# Patient Record
Sex: Male | Born: 1975 | Hispanic: Yes | Marital: Married | Smoking: Current every day smoker
Health system: Southern US, Community
[De-identification: ages and names within clinical notes are randomized; demographics above are authoritative.]

## PROBLEM LIST (undated history)

## (undated) DIAGNOSIS — K219 Gastro-esophageal reflux disease without esophagitis: Secondary | ICD-10-CM

## (undated) DIAGNOSIS — J189 Pneumonia, unspecified organism: Secondary | ICD-10-CM

## (undated) DIAGNOSIS — F32A Depression, unspecified: Secondary | ICD-10-CM

## (undated) DIAGNOSIS — I700 Atherosclerosis of aorta: Secondary | ICD-10-CM

## (undated) DIAGNOSIS — I10 Essential (primary) hypertension: Secondary | ICD-10-CM

## (undated) DIAGNOSIS — G904 Autonomic dysreflexia: Secondary | ICD-10-CM

## (undated) DIAGNOSIS — J45909 Unspecified asthma, uncomplicated: Secondary | ICD-10-CM

## (undated) DIAGNOSIS — F419 Anxiety disorder, unspecified: Secondary | ICD-10-CM

## (undated) DIAGNOSIS — R519 Headache, unspecified: Secondary | ICD-10-CM

## (undated) DIAGNOSIS — K5790 Diverticulosis of intestine, part unspecified, without perforation or abscess without bleeding: Secondary | ICD-10-CM

## (undated) HISTORY — DX: Diverticulosis of intestine, part unspecified, without perforation or abscess without bleeding: K57.90

## (undated) HISTORY — DX: Essential (primary) hypertension: I10

## (undated) HISTORY — DX: Autonomic dysreflexia: G90.4

## (undated) HISTORY — DX: Depression, unspecified: F32.A

## (undated) HISTORY — DX: Anxiety disorder, unspecified: F41.9

## (undated) HISTORY — DX: Atherosclerosis of aorta: I70.0

## (undated) HISTORY — PX: BACK SURGERY: SHX140

---

## 2021-02-01 ENCOUNTER — Ambulatory Visit (INDEPENDENT_AMBULATORY_CARE_PROVIDER_SITE_OTHER): Payer: 59 | Admitting: Specialist

## 2021-02-01 ENCOUNTER — Ambulatory Visit (INDEPENDENT_AMBULATORY_CARE_PROVIDER_SITE_OTHER): Payer: 59

## 2021-02-01 ENCOUNTER — Encounter: Payer: Self-pay | Admitting: Specialist

## 2021-02-01 ENCOUNTER — Other Ambulatory Visit: Payer: Self-pay

## 2021-02-01 VITALS — BP 139/94 | HR 90 | Ht 69.0 in | Wt 171.0 lb

## 2021-02-01 DIAGNOSIS — M960 Pseudarthrosis after fusion or arthrodesis: Secondary | ICD-10-CM | POA: Diagnosis not present

## 2021-02-01 DIAGNOSIS — M542 Cervicalgia: Secondary | ICD-10-CM | POA: Diagnosis not present

## 2021-02-01 NOTE — Patient Instructions (Addendum)
Avoid overhead lifting and overhead use of the arms. Do not lift greater than 15-25 lbs. Adjust head rest in vehicle to prevent hyperextension if rear ended. Take extra precautions to avoid falling. CT scan of the cervical spine to assess for nonunion of the C6-7 level. Follow-Up Instructions: Return in about 3 weeks (around 02/22/2021).

## 2021-02-01 NOTE — Progress Notes (Signed)
Office Visit Note   Patient: Victor Huang           Date of Birth: 1976-12-01           MRN: [id number] Visit Date: 02/01/2021              Requested by: Dema Severin, NP [address] [place],  [place] [address] PCP: No primary care provider on file.   Assessment & Plan: Visit Diagnoses:  1. Cervicalgia   2. Pseudarthrosis after fusion or arthrodesis     Plan:Avoid overhead lifting and overhead use of the arms. Do not lift greater than 15-25 lbs. Adjust head rest in vehicle to prevent hyperextension if rear ended. Take extra precautions to avoid falling. CT scan of the cervical spine to assess for nonunion of the C6-7 level. Follow-Up Instructions: Return in about 3 weeks (around 02/22/2021).   Orders:  Orders Placed This Encounter  Procedures  . XR Cervical Spine 2 or 3 views   No orders of the defined types were placed in this encounter.     Procedures: No procedures performed   Clinical Data: No additional findings.   Subjective: Chief Complaint  Patient presents with  . Neck - Pain    2nd Opinion--referred by [facility]  . Left Shoulder - Pain    2nd Opinion--referred by [facility]    45 year old male with history of neck pain and left shoulder pain. He has a history of cervical spine surgery ACDF at 2 levels done at [facility] 10/2018. 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 He had spurs and post op he did well with about 98%improvement. He started experiencing increasing pain into the left shoulder And he was seen by the surgeon and was told that it was normal and the doctor did not spend much time with him. The Doctor that performed the surgery name he does not remember, ?Dr. Chales Abrahams. He has pain with with extension and pain with lying on the left shoulder and with overhead use of the left shoulder. No bowel or bladder difficulty, no gait difficulty.  History of left shoulder MRI and no previous left  Shoulder. The left shoulder pain improved with surgery on the neck.There is tightness in the trapezius with lying on the left side. The pain  He experiences with overhead use of the left arm and with the arm across his chest in into the left lower lateral neck.    Review of Systems  Constitutional: Negative for activity change, appetite change, chills, diaphoresis, fatigue, fever and unexpected weight change.  HENT: Negative.  Negative for congestion, dental problem, drooling, ear discharge, ear pain, facial swelling, hearing loss, mouth sores, nosebleeds, postnasal drip, rhinorrhea, sinus pressure, sinus pain, sneezing, sore throat, tinnitus, trouble swallowing and voice change.   Eyes: Negative.  Negative for photophobia, pain, discharge, redness, itching and visual disturbance.  Respiratory: Negative.  Negative for apnea, cough, choking, chest tightness, shortness of breath, wheezing and stridor.   Cardiovascular: Negative.  Negative for chest pain, palpitations and leg swelling.  Gastrointestinal: Negative.  Negative for abdominal distention, abdominal pain, anal bleeding, blood in stool, constipation, diarrhea, nausea, rectal pain and vomiting.  Endocrine: Positive for cold intolerance. Negative for heat intolerance, polydipsia, polyphagia and polyuria.  Genitourinary: Negative for difficulty urinating, dysuria, enuresis, flank pain, frequency, genital sores, hematuria and penile pain.  Musculoskeletal: Positive for neck pain  and neck stiffness. Negative for arthralgias, back pain, gait problem, joint swelling and myalgias.  Skin: Negative.  Negative for color change, pallor, rash and wound.  Allergic/Immunologic: Negative.  Negative for environmental allergies, food allergies and immunocompromised state.  Neurological: Negative.   Hematological: Negative.   Psychiatric/Behavioral: Negative.      Objective: Vital Signs: BP (!) 139/94 (BP  Location: Left Arm, Patient Position: Sitting)   Pulse 90   Ht 5\' 9"  (1.753 m)   Wt 171 lb (77.6 kg)   BMI 25.25 kg/m   Physical Exam Constitutional:      Appearance: He is well-developed and well-nourished.  HENT:     Head: Normocephalic and atraumatic.  Eyes:     Extraocular Movements: EOM normal.     Pupils: Pupils are equal, round, and reactive to light.  Pulmonary:     Effort: Pulmonary effort is normal.     Breath sounds: Normal breath sounds.  Abdominal:     General: Bowel sounds are normal.     Palpations: Abdomen is soft.  Musculoskeletal:     Cervical back: Normal range of motion and neck supple.  Skin:    General: Skin is warm and dry.  Neurological:     Mental Status: He is alert and oriented to person, place, and time.  Psychiatric:        Mood and Affect: Mood and affect normal.        Behavior: Behavior normal.        Thought Content: Thought content normal.        Judgment: Judgment normal.     Back Exam   Tenderness  The patient is experiencing tenderness in the cervical.  Range of Motion  Extension:  50 abnormal  Flexion:  70 abnormal  Lateral bend right: 70  Lateral bend left: 60  Rotation right: 70  Rotation left: 60   Reflexes  Biceps: 2/4  Comments:  Strength is intact bilateral arms. Tender left cervical lateral mass lower neck C5-7      Specialty Comments:  No specialty comments available.  Imaging: XR Cervical Spine 2 or 3 views  Result Date: 02/01/2021 AP and lateral flexion and extension radiographs of the cervical spine demonstrate anterior plate and screws fixing C5 to C7 with interbody cages and graft at C5-6 and C6-7. With flexion and extension less than 1 mm difference measured between similar points over the posterior aspect of C5-6 and nearly 3 mm of motion noted at C6-7. There is mild DDD at the C7-T1 level with anterior calcification of the disc.     PMFS History: There are no problems to display for this  patient.  History reviewed. No pertinent past medical history.  History reviewed. No pertinent family history.  History reviewed. No pertinent surgical history. Social History   Occupational History  . Not on file  Tobacco Use  . Smoking status: Not on file  . Smokeless tobacco: Not on file  Substance and Sexual Activity  . Alcohol use: Not on file  . Drug use: Not on file  . Sexual activity: Not on file

## 2021-02-14 ENCOUNTER — Other Ambulatory Visit: Payer: Self-pay

## 2021-02-14 ENCOUNTER — Ambulatory Visit (HOSPITAL_BASED_OUTPATIENT_CLINIC_OR_DEPARTMENT_OTHER)
Admission: RE | Admit: 2021-02-14 | Discharge: 2021-02-14 | Disposition: A | Discharge status: Home / Self Care | Payer: 59 | Source: Ambulatory Visit | Attending: Specialist | Admitting: Specialist

## 2021-02-14 DIAGNOSIS — M960 Pseudarthrosis after fusion or arthrodesis: Secondary | ICD-10-CM | POA: Insufficient documentation

## 2021-02-14 DIAGNOSIS — M542 Cervicalgia: Secondary | ICD-10-CM | POA: Insufficient documentation

## 2021-03-01 ENCOUNTER — Ambulatory Visit (INDEPENDENT_AMBULATORY_CARE_PROVIDER_SITE_OTHER): Payer: 59 | Admitting: Specialist

## 2021-03-01 ENCOUNTER — Encounter: Payer: Self-pay | Admitting: Specialist

## 2021-03-01 ENCOUNTER — Other Ambulatory Visit: Payer: Self-pay

## 2021-03-01 VITALS — BP 159/91 | HR 101 | Ht 69.0 in | Wt 171.0 lb

## 2021-03-01 DIAGNOSIS — M542 Cervicalgia: Secondary | ICD-10-CM

## 2021-03-01 DIAGNOSIS — M5030 Other cervical disc degeneration, unspecified cervical region: Secondary | ICD-10-CM

## 2021-03-01 DIAGNOSIS — M960 Pseudarthrosis after fusion or arthrodesis: Secondary | ICD-10-CM

## 2021-03-01 MED ORDER — HYDROCODONE-ACETAMINOPHEN 5-325 MG PO TABS: 1.0000 | ORAL_TABLET | Freq: Four times a day (QID) | ORAL | 0 refills | Status: DC | PRN

## 2021-03-01 NOTE — Progress Notes (Signed)
Office Visit Note   Patient: Victor Huang           Date of Birth: 10-25-1976           MRN: [id number] Visit Date: 03/01/2021              Requested by: No referring provider defined for this encounter. PCP: Patient, No Pcp Per   Assessment & Plan: Visit Diagnoses:  1. Pseudarthrosis after fusion or arthrodesis   2. Cervicalgia   3. Degenerative disc disease, cervical     Plan: Avoid overhead lifting and overhead use of the arms. Do not lift greater than 5 lbs. Adjust head rest in vehicle to prevent hyperextension if rear ended. Take extra precautions to avoid falling MRI of the cervical spine to assess for nerve compression perop planning for rearthrodesis at C5-6 and possible C6-7 first time arthrodesis.  Follow-Up Instructions: Return in about 3 weeks (around 03/22/2021).   Orders:  No orders of the defined types were placed in this encounter.  No orders of the defined types were placed in this encounter.     Procedures: No procedures performed   Clinical Data: Findings:  Narrative & Impression CLINICAL DATA:  Chronic neck pain.  History of C4-6 fusion in 2019.  EXAM: CT CERVICAL SPINE WITHOUT CONTRAST  TECHNIQUE: Multidetector CT imaging of the cervical spine was performed without intravenous contrast. Multiplanar CT image reconstructions were also generated.  COMPARISON:  Plain film cervical spine 02/01/2021.  FINDINGS: Alignment: No listhesis.  Straightening of lordosis noted.  Skull base and vertebrae: No acute fracture. No primary bone lesion or focal pathologic process. The patient is status post C4-6 ACDF. Hardware is intact without evidence of loosening. There is solid bridging bone across the C4-5 disc interspace. There is incorporation of interbody spacer into the endplates at C5-6 but no coalescence of bone across the central aspect of the spacer. There is no facet ankylosis at either level.  Soft tissues and spinal canal: No  prevertebral fluid or swelling. No visible canal hematoma.  Disc levels:  The central canal and foramina appear open.  Upper chest: Emphysematous disease is seen in the apices.  Other: None.  IMPRESSION: Status post C4-6 fusion. There is no bridging bone across the C5-6 disc interspace consistent with pseudoarthrosis. The C4-5 level is solidly fused. Hardware is intact without loosening or other complicating feature.  Emphysema (ICD10-J43.9).   Electronically Signed   By: Drusilla Kanner M.D.   On: 02/14/2021 15:06      Subjective: Chief Complaint  Patient presents with  . Neck - Follow-up    CT Scan review    45 year old right handed male with left shoulder and neck pain post C4-6 ACDF in 2019. He has pain with lying on the left side pain into the left shoulder and notes decrease grip left hand. Pain with ROM of the cervical spine. Pain in the neck and shoulder is greater than into the left arm below the shoulder.Underwent recent CT scan of the cervical spine. Post op 2019 he got much better but then over the last 6 months the pain into the left shoulder and neck is worsening. He was able to return to work. No bowel or bladder difficulty, no gait disturbance.    Review of Systems  Constitutional: Negative.   HENT: Negative.   Eyes: Negative.   Respiratory: Negative.   Cardiovascular: Negative.   Gastrointestinal: Negative.   Endocrine: Negative.   Genitourinary: Negative.   Musculoskeletal: Negative.  Skin: Negative.   Allergic/Immunologic: Negative.   Neurological: Negative.   Hematological: Negative.   Psychiatric/Behavioral: Negative.      Objective: Vital Signs: BP (!) 159/91 (BP Location: Left Arm, Patient Position: Sitting)   Pulse (!) 101   Ht 5\' 9"  (1.753 m)   Wt 171 lb (77.6 kg)   BMI 25.25 kg/m   Physical Exam Constitutional:      Appearance: He is well-developed and well-nourished.  HENT:     Head: Normocephalic and atraumatic.   Eyes:     Extraocular Movements: EOM normal.     Pupils: Pupils are equal, round, and reactive to light.  Pulmonary:     Effort: Pulmonary effort is normal.     Breath sounds: Normal breath sounds.  Abdominal:     General: Bowel sounds are normal.     Palpations: Abdomen is soft.  Musculoskeletal:     Cervical back: Normal range of motion and neck [place].     Lumbar back: Negative right straight leg raise test and negative left straight leg raise test.  Skin:    General: Skin is warm and dry.  Neurological:     Mental Status: He is alert and oriented to person, place, and time.  Psychiatric:        Mood and Affect: Mood and affect normal.        Behavior: Behavior normal.        Thought Content: Thought content normal.        Judgment: Judgment normal.     Back Exam   Range of Motion  Extension: abnormal  Flexion: abnormal  Lateral bend right: abnormal  Lateral bend left: abnormal  Rotation right: abnormal   Muscle Strength  Right Quadriceps:  5/5  Left Quadriceps:  5/5  Right Hamstrings:  5/5  Left Hamstrings:  5/5   Tests  Straight leg raise right: negative Straight leg raise left: negative  Other  Toe walk: normal Heel walk: normal Sensation: normal      Specialty Comments:  No specialty comments available.  Imaging: No results found.   PMFS History: There are no problems to display for this patient.  History reviewed. No pertinent past medical history.  History reviewed. No pertinent family history.  History reviewed. No pertinent surgical history. Social History   Occupational History  . Not on file  Tobacco Use  . Smoking status: Current Every Day Smoker    Packs/day: 2.00    Types: Cigarettes    Start date: 01/01/1987  . Smokeless tobacco: Never Used  Substance and Sexual Activity  . Alcohol use: Never    Comment: no beer in over 15 years  . Drug use: Yes  . Sexual activity: Yes

## 2021-03-01 NOTE — Patient Instructions (Signed)
Avoid overhead lifting and overhead use of the arms. Do not lift greater than 5 lbs. Adjust head rest in vehicle to prevent hyperextension if rear ended. Take extra precautions to avoid falling MRI of the cervical spine to assess for nerve compression perop planning for rearthrodesis at C5-6 and possible C6-7 first time arthrodesis.

## 2021-03-17 ENCOUNTER — Ambulatory Visit (HOSPITAL_BASED_OUTPATIENT_CLINIC_OR_DEPARTMENT_OTHER)
Admission: RE | Admit: 2021-03-17 | Discharge: 2021-03-17 | Disposition: A | Discharge status: Home / Self Care | Payer: 59 | Source: Ambulatory Visit | Attending: Specialist | Admitting: Specialist

## 2021-03-17 ENCOUNTER — Other Ambulatory Visit: Payer: Self-pay

## 2021-03-17 DIAGNOSIS — M960 Pseudarthrosis after fusion or arthrodesis: Secondary | ICD-10-CM | POA: Diagnosis not present

## 2021-03-17 DIAGNOSIS — M5030 Other cervical disc degeneration, unspecified cervical region: Secondary | ICD-10-CM | POA: Diagnosis present

## 2021-03-17 DIAGNOSIS — M542 Cervicalgia: Secondary | ICD-10-CM | POA: Insufficient documentation

## 2021-04-04 ENCOUNTER — Ambulatory Visit (INDEPENDENT_AMBULATORY_CARE_PROVIDER_SITE_OTHER): Payer: 59 | Admitting: Specialist

## 2021-04-04 ENCOUNTER — Encounter: Payer: Self-pay | Admitting: Specialist

## 2021-04-04 ENCOUNTER — Other Ambulatory Visit: Payer: Self-pay

## 2021-04-04 VITALS — BP 142/87 | HR 92 | Ht 69.0 in | Wt 171.0 lb

## 2021-04-04 DIAGNOSIS — M5030 Other cervical disc degeneration, unspecified cervical region: Secondary | ICD-10-CM

## 2021-04-04 DIAGNOSIS — M4722 Other spondylosis with radiculopathy, cervical region: Secondary | ICD-10-CM | POA: Diagnosis not present

## 2021-04-04 DIAGNOSIS — M960 Pseudarthrosis after fusion or arthrodesis: Secondary | ICD-10-CM

## 2021-04-04 DIAGNOSIS — M542 Cervicalgia: Secondary | ICD-10-CM | POA: Diagnosis not present

## 2021-04-04 DIAGNOSIS — M5010 Cervical disc disorder with radiculopathy, unspecified cervical region: Secondary | ICD-10-CM

## 2021-04-04 MED ORDER — HYDROCODONE-ACETAMINOPHEN 5-325 MG PO TABS: 1.0000 | ORAL_TABLET | Freq: Four times a day (QID) | ORAL | 0 refills | Status: DC | PRN

## 2021-04-04 NOTE — Patient Instructions (Signed)
  Plan: Avoid overhead lifting and overhead use of the arms. Do not lift greater than 25 lbs. Adjust head rest in vehicle to prevent hyperextension if rear ended. Take extra precautions to avoid falling.  May use ice or moist heat for pain. Weight loss is of benefit. Handicap license is approved. Dr. [place] Blas secretary/Assistant will call to arrange for Left EMG/NCV and assess for left C6, C7 or C8 changes, assess for CTS. And cubital tunnel syndrome.

## 2021-04-04 NOTE — Progress Notes (Signed)
   Office Visit Note   Patient: Victor Huang           Date of Birth: 1976-04-24           MRN: [id number] Visit Date: 04/04/2021              Requested by: No referring provider defined for this encounter. PCP: Patient, No Pcp Per (Inactive)   Assessment & Plan: Visit Diagnoses:  1. Pseudarthrosis after fusion or arthrodesis   2. Cervicalgia   3. Degenerative disc disease, cervical   4. Other spondylosis with radiculopathy, cervical region   5. Herniation of cervical intervertebral disc with radiculopathy     Plan: Avoid overhead lifting and overhead use of the arms. Do not lift greater than 25 lbs. Adjust head rest in vehicle to prevent hyperextension if rear ended. Take extra precautions to avoid falling.  May use ice or moist heat for pain. Weight loss is of benefit. Handicap license is approved. Dr. [place] Blas secretary/Assistant will call to arrange for Left EMG/NCV and assess for left C6, C7 or C8 changes, assess for CTS. And cubital tunnel syndrome.  Follow-Up Instructions: Return in about 4 weeks (around 05/02/2021).   Orders:  No orders of the defined types were placed in this encounter.  No orders of the defined types were placed in this encounter.     Procedures: No procedures performed   Clinical Data: No additional findings.   Subjective: Chief Complaint  Patient presents with  . Neck - Follow-up    MRI Review    HPI  Review of Systems   Objective: Vital Signs: BP (!) 142/87 (BP Location: Left Arm, Patient Position: Sitting)   Pulse 92   Ht 5\' 9"  (1.753 m)   Wt 171 lb (77.6 kg)   BMI 25.25 kg/m   Physical Exam  Ortho Exam  Specialty Comments:  No specialty comments available.  Imaging: No results found.   PMFS History: There are no problems to display for this patient.  History reviewed. No pertinent past medical history.  History reviewed. No pertinent family history.  History reviewed. No pertinent surgical history. Social  History   Occupational History  . Not on file  Tobacco Use  . Smoking status: Current Every Day Smoker    Packs/day: 2.00    Types: Cigarettes    Start date: 01/01/1987  . Smokeless tobacco: Never Used  Substance and Sexual Activity  . Alcohol use: Never    Comment: no beer in over 15 years  . Drug use: Yes  . Sexual activity: Yes

## 2021-04-25 ENCOUNTER — Other Ambulatory Visit: Payer: Self-pay | Admitting: Specialist

## 2021-04-25 MED ORDER — HYDROCODONE-ACETAMINOPHEN 5-325 MG PO TABS: 1.0000 | ORAL_TABLET | Freq: Four times a day (QID) | ORAL | 0 refills | Status: DC | PRN

## 2021-04-25 NOTE — Telephone Encounter (Signed)
Pt called asking for a refill of his norco/ vicodin 5/325 mg please; and he would like to be notified when it's sent.   [phone]

## 2021-05-09 ENCOUNTER — Telehealth: Payer: Self-pay | Admitting: Specialist

## 2021-05-09 NOTE — Telephone Encounter (Signed)
I called and spoke with patient, he states that on Thursday (05/03/21) he started having pain in the left shoulder, it has been worsening since then, states that it feels like something ripped in there, and now with any motion he has a burning sensation in it.  Please advise.

## 2021-05-09 NOTE — Telephone Encounter (Signed)
Pt called and states that he is having issues with top of left shoulder. He would like a call back [phone]

## 2021-05-10 NOTE — Telephone Encounter (Signed)
Per Dr. Otelia Sergeant scheduled him to see Fayrene Fearing on 05/16/21 @ 945

## 2021-05-16 ENCOUNTER — Encounter: Payer: Self-pay | Admitting: Surgery

## 2021-05-16 ENCOUNTER — Telehealth: Payer: Self-pay | Admitting: Surgery

## 2021-05-16 ENCOUNTER — Ambulatory Visit (INDEPENDENT_AMBULATORY_CARE_PROVIDER_SITE_OTHER): Payer: 59 | Admitting: Surgery

## 2021-05-16 DIAGNOSIS — M5030 Other cervical disc degeneration, unspecified cervical region: Secondary | ICD-10-CM | POA: Diagnosis not present

## 2021-05-16 NOTE — Progress Notes (Signed)
   Office Visit Note   Patient: Victor Huang           Date of Birth: July 15, 1976           MRN: [id number] Visit Date: 05/16/2021              Requested by: No referring provider defined for this encounter. PCP: Patient, No Pcp Per (Inactive)   Assessment & Plan: Visit Diagnoses:  1. Degenerative disc disease, cervical     Plan: Patient will keep appointment for NCV/EMG study scheduled for May 18, 2021 and follow-up with Dr. Otelia Sergeant in a couple of weeks to discuss results and further treatment options regarding his cervical spine issues.  Question whether or not he is a candidate for cervical ESI's or will be needing any surgical intervention.  Follow-Up Instructions: Return in about 2 weeks (around 05/30/2021) for with dr Otelia Sergeant to review NCV/emg.   Orders:  No orders of the defined types were placed in this encounter.  No orders of the defined types were placed in this encounter.     Procedures: No procedures performed   Clinical Data: No additional findings.   Subjective: Chief Complaint  Patient presents with  . Left Shoulder - Pain    HPI 45 year old male comes in with complaints of neck pain and left upper extremity radiculopathy.  Patient has a known history of cervical spondylosis with previous fusion C4-C6.  He was seen by Dr. Otelia Sergeant for the same complaint last month.  He is scheduled for NCV/EMG study with Dr. Alvester Morin.  States that he continues to have left-sided neck pain with radiation to the left shoulder.  Pain can be constant at times.  Not really aggravated with overhead activity. Review of Systems No current cardiac pulmonary GI GU issues  Objective: Vital Signs: There were no vitals taken for this visit.  Physical Exam HENT:     Head: Normocephalic.  Eyes:     Extraocular Movements: Extraocular movements intact.  Pulmonary:     Effort: No respiratory distress.  Musculoskeletal:     Comments: Patient has bilateral brachial plexus, trapezius and  scapular tenderness.  Bilateral shoulders negative impingement test.  He has trace left biceps and triceps weakness.  Neurological:     Mental Status: He is alert and oriented to person, place, and time.  Psychiatric:        Mood and Affect: Mood normal.     Ortho Exam  Specialty Comments:  No specialty comments available.  Imaging: No results found.   PMFS History: There are no problems to display for this patient.  History reviewed. No pertinent past medical history.  History reviewed. No pertinent family history.  History reviewed. No pertinent surgical history. Social History   Occupational History  . Not on file  Tobacco Use  . Smoking status: Current Every Day Smoker    Packs/day: 2.00    Types: Cigarettes    Start date: 01/01/1987  . Smokeless tobacco: Never Used  Substance and Sexual Activity  . Alcohol use: Never    Comment: no beer in over 15 years  . Drug use: Yes  . Sexual activity: Yes

## 2021-05-16 NOTE — Telephone Encounter (Signed)
Please advise 

## 2021-05-16 NOTE — Telephone Encounter (Signed)
Patient called needing Rx refilled Hydrocodone. The number to contact patient is [phone]

## 2021-05-18 ENCOUNTER — Ambulatory Visit (INDEPENDENT_AMBULATORY_CARE_PROVIDER_SITE_OTHER): Payer: 59 | Admitting: Physical Medicine and Rehabilitation

## 2021-05-18 ENCOUNTER — Other Ambulatory Visit: Payer: Self-pay | Admitting: Specialist

## 2021-05-18 ENCOUNTER — Other Ambulatory Visit: Payer: Self-pay

## 2021-05-18 ENCOUNTER — Encounter: Payer: Self-pay | Admitting: Physical Medicine and Rehabilitation

## 2021-05-18 DIAGNOSIS — R202 Paresthesia of skin: Secondary | ICD-10-CM | POA: Diagnosis not present

## 2021-05-18 MED ORDER — HYDROCODONE-ACETAMINOPHEN 5-325 MG PO TABS: 1.0000 | ORAL_TABLET | Freq: Four times a day (QID) | ORAL | 0 refills | Status: DC | PRN

## 2021-05-18 NOTE — Telephone Encounter (Signed)
Pt would like a refill on pain medication hydrocodone

## 2021-05-18 NOTE — Progress Notes (Signed)
Left sided neck and arm pain. Numbness in left third and fourth fingers.  Right hand dominant No lotion per patient Numeric Pain Rating Scale and Functional Assessment Average Pain 9   In the last MONTH (on 0-10 scale) has pain interfered with the following?  1. General activity like being  able to carry out your everyday physical activities such as walking, climbing stairs, carrying groceries, or moving a chair?  Rating(9)

## 2021-05-18 NOTE — Procedures (Signed)
EMG & NCV Findings: All nerve conduction studies (as indicated in the following tables) were within normal limits.    Needle evaluation of the left triceps muscle showed diminished recruitment.  All remaining muscles (as indicated in the following table) showed no evidence of electrical instability.    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  There is no significant electrodiagnostic evidence of any other focal nerve entrapment, brachial plexopathy or cervical radiculopathy.   Recommendations: 1.  Follow-up with referring physician. 2.  Continue current management of symptoms.  ___________________________ Naaman Plummer FAAPMR Board Certified, American Board of Physical Medicine and Rehabilitation    Nerve Conduction Studies Anti Sensory Summary Table   Stim Site NR Peak (ms) Norm Peak (ms) P-T Amp (V) Norm P-T Amp Site1 Site2 Delta-P (ms) Dist (cm) Vel (m/s) Norm Vel (m/s)  Left Median Acr Palm Anti Sensory (2nd Digit)  31.5C  Wrist    3.1 <3.6 23.9 >10 Wrist Palm 1.3 0.0    Palm    1.8 <2.0 28.6         Left Radial Anti Sensory (Base 1st Digit)  31.1C  Wrist    2.0 <3.1 35.7  Wrist Base 1st Digit 2.0 0.0    Left Ulnar Anti Sensory (5th Digit)  31.6C  Wrist    3.1 <3.7 40.2 >15.0 Wrist 5th Digit 3.1 14.0 45 >38   Motor Summary Table   Stim Site NR Onset (ms) Norm Onset (ms) O-P Amp (mV) Norm O-P Amp Site1 Site2 Delta-0 (ms) Dist (cm) Vel (m/s) Norm Vel (m/s)  Left Median Motor (Abd Poll Brev)  31C  Wrist    2.9 <4.2 8.4 >5 Elbow Wrist 3.9 22.0 56 >50  Elbow    6.8  8.4         Left Ulnar Motor (Abd Dig Min)  30.9C  Wrist    2.7 <4.2 10.1 >3 B Elbow Wrist 3.5 20.5 59 >53  B Elbow    6.2  10.3  A Elbow B Elbow 1.1 10.0 91 >53  A Elbow    7.3  10.3          EMG   Side Muscle Nerve Root Ins Act Fibs Psw Amp Dur Poly Recrt Int Dennie Bible Comment  Left 1stDorInt Ulnar C8-T1 Nml Nml Nml Nml  Nml 0 Nml Nml   Left Abd Poll Brev Median C8-T1 Nml Nml Nml Nml Nml 0 Nml Nml   Left Triceps Radial C6-7-8 Nml Nml Nml Nml Nml 0 *Reduced Nml   Left Deltoid Axillary C5-6 Nml Nml Nml Nml Nml 0 Nml Nml   Left Ext Digitorum  Radial (Post Int) C7-8 Nml Nml Nml Nml Nml 0 Nml Nml     Nerve Conduction Studies Anti Sensory Left/Right Comparison   Stim Site L Lat (ms) R Lat (ms) L-R Lat (ms) L Amp (V) R Amp (V) L-R Amp (%) Site1 Site2 L Vel (m/s) R Vel (m/s) L-R Vel (m/s)  Median Acr Palm Anti Sensory (2nd Digit)  31.5C  Wrist 3.1   23.9   Wrist Palm     Palm 1.8   28.6         Radial Anti Sensory (Base 1st Digit)  31.1C  Wrist 2.0   35.7   Wrist Base 1st Digit     Ulnar Anti Sensory (5th Digit)  31.6C  Wrist 3.1   40.2   Wrist 5th Digit 45  Motor Left/Right Comparison   Stim Site L Lat (ms) R Lat (ms) L-R Lat (ms) L Amp (mV) R Amp (mV) L-R Amp (%) Site1 Site2 L Vel (m/s) R Vel (m/s) L-R Vel (m/s)  Median Motor (Abd Poll Brev)  31C  Wrist 2.9   8.4   Elbow Wrist 56    Elbow 6.8   8.4         Ulnar Motor (Abd Dig Min)  30.9C  Wrist 2.7   10.1   B Elbow Wrist 59    B Elbow 6.2   10.3   A Elbow B Elbow 91    A Elbow 7.3   10.3            Waveforms:

## 2021-05-18 NOTE — Addendum Note (Signed)
Addended by: Penne Lash, Otis Dials on: 05/18/2021 10:58 AM   Modules accepted: Orders

## 2021-05-18 NOTE — Progress Notes (Signed)
Victor Huang - 45 y.o. male MRN [id number]  Date of birth: Jun 27, 1976  Office Visit Note: Visit Date: 05/18/2021 PCP: Patient, No Pcp Per (Inactive) Referred by: Kerrin Champagne, MD  Subjective: Chief Complaint  Patient presents with  . Left Arm - Pain  . Left Hand - Numbness   HPI:  Victor Huang is a 45 y.o. male who comes in today at the request of Dr. [name] for electrodiagnostic study of the Left upper extremities.  Patient is Right hand dominant.   ROS Otherwise per HPI.  Assessment & Plan: Visit Diagnoses:    ICD-10-CM   1. Paresthesia of skin  R20.2 NCV with EMG (electromyography)    Plan: Impression: The above electrodiagnostic study is essentially NORMAL but is suggestive of mild chronic C7 radiculopathy on the left.  There is no significant electrodiagnostic evidence of any other focal nerve entrapment, brachial plexopathy or cervical radiculopathy.   Recommendations: 1.  Follow-up with referring physician. 2.  Continue current management of symptoms.    Meds & Orders: No orders of the defined types were placed in this encounter.   Orders Placed This Encounter  Procedures  . NCV with EMG (electromyography)    Follow-up: No follow-ups on file.   Procedures: No procedures performed  EMG & NCV Findings: All nerve conduction studies (as indicated in the following tables) were within normal limits.    Needle evaluation of the left triceps muscle showed diminished recruitment.  All remaining muscles (as indicated in the following table) showed no evidence of electrical instability.    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  There is no significant electrodiagnostic evidence of any other focal nerve entrapment, brachial plexopathy or cervical radiculopathy.   Recommendations: 1.  Follow-up with referring physician. 2.  Continue current management of  symptoms.  ___________________________ Naaman Plummer FAAPMR Board Certified, American Board of Physical Medicine and Rehabilitation    Nerve Conduction Studies Anti Sensory Summary Table   Stim Site NR Peak (ms) Norm Peak (ms) P-T Amp (V) Norm P-T Amp Site1 Site2 Delta-P (ms) Dist (cm) Vel (m/s) Norm Vel (m/s)  Left Median Acr Palm Anti Sensory (2nd Digit)  31.5C  Wrist    3.1 <3.6 23.9 >10 Wrist Palm 1.3 0.0    Palm    1.8 <2.0 28.6         Left Radial Anti Sensory (Base 1st Digit)  31.1C  Wrist    2.0 <3.1 35.7  Wrist Base 1st Digit 2.0 0.0    Left Ulnar Anti Sensory (5th Digit)  31.6C  Wrist    3.1 <3.7 40.2 >15.0 Wrist 5th Digit 3.1 14.0 45 >38   Motor Summary Table   Stim Site NR Onset (ms) Norm Onset (ms) O-P Amp (mV) Norm O-P Amp Site1 Site2 Delta-0 (ms) Dist (cm) Vel (m/s) Norm Vel (m/s)  Left Median Motor (Abd Poll Brev)  31C  Wrist    2.9 <4.2 8.4 >5 Elbow Wrist 3.9 22.0 56 >50  Elbow    6.8  8.4         Left Ulnar Motor (Abd Dig Min)  30.9C  Wrist    2.7 <4.2 10.1 >3 B Elbow Wrist 3.5 20.5 59 >53  B Elbow    6.2  10.3  A Elbow B Elbow 1.1 10.0 91 >53  A Elbow    7.3  10.3  EMG   Side Muscle Nerve Root Ins Act Fibs Psw Amp Dur Poly Recrt Int Dennie Bible Comment  Left 1stDorInt Ulnar C8-T1 Nml Nml Nml Nml Nml 0 Nml Nml   Left Abd Poll Brev Median C8-T1 Nml Nml Nml Nml Nml 0 Nml Nml   Left Triceps Radial C6-7-8 Nml Nml Nml Nml Nml 0 *Reduced Nml   Left Deltoid Axillary C5-6 Nml Nml Nml Nml Nml 0 Nml Nml   Left Ext Digitorum  Radial (Post Int) C7-8 Nml Nml Nml Nml Nml 0 Nml Nml     Nerve Conduction Studies Anti Sensory Left/Right Comparison   Stim Site L Lat (ms) R Lat (ms) L-R Lat (ms) L Amp (V) R Amp (V) L-R Amp (%) Site1 Site2 L Vel (m/s) R Vel (m/s) L-R Vel (m/s)  Median Acr Palm Anti Sensory (2nd Digit)  31.5C  Wrist 3.1   23.9   Wrist Palm     Palm 1.8   28.6         Radial Anti Sensory (Base 1st Digit)  31.1C  Wrist 2.0   35.7   Wrist Base 1st  Digit     Ulnar Anti Sensory (5th Digit)  31.6C  Wrist 3.1   40.2   Wrist 5th Digit 45     Motor Left/Right Comparison   Stim Site L Lat (ms) R Lat (ms) L-R Lat (ms) L Amp (mV) R Amp (mV) L-R Amp (%) Site1 Site2 L Vel (m/s) R Vel (m/s) L-R Vel (m/s)  Median Motor (Abd Poll Brev)  31C  Wrist 2.9   8.4   Elbow Wrist 56    Elbow 6.8   8.4         Ulnar Motor (Abd Dig Min)  30.9C  Wrist 2.7   10.1   B Elbow Wrist 59    B Elbow 6.2   10.3   A Elbow B Elbow 91    A Elbow 7.3   10.3            Waveforms:             Clinical History: No specialty comments available.     Objective:  VS:  HT:    WT:   BMI:     BP:   HR: bpm  TEMP: ( )  RESP:  Physical Exam   Imaging: No results found.

## 2021-05-21 ENCOUNTER — Telehealth: Payer: Self-pay | Admitting: Surgery

## 2021-05-21 NOTE — Telephone Encounter (Signed)
Needs ROV with dr Otelia Sergeant to review NCV/EMG study and discuss treatment options (operative vs nonoperative).  Please change from my schedule on Wednesday 25 May. He can see nitka in a couple of weeks.  I didn't send in his pain med.  Forward this back to me after you change appointment and advise patient.  Thank you.

## 2021-05-21 NOTE — Telephone Encounter (Signed)
Needs ROV with dr Otelia Sergeant to review NCV/EMG study and discuss treatment options (operative vs nonoperative).  Please change from my schedule on Wednesday.

## 2021-05-22 NOTE — Telephone Encounter (Signed)
Please see message from [place] below.  Let me know if you need me to do anything further. Thanks.

## 2021-05-22 NOTE — Telephone Encounter (Signed)
Appt moved to 05/23/21 with Dr. Otelia Sergeant, and his pain meds were sent in on 05/18/21.

## 2021-05-22 NOTE — Telephone Encounter (Signed)
Appt moved to 05/23/21

## 2021-05-22 NOTE — Telephone Encounter (Signed)
Please see message from [place]. I will be glad to call patient and discuss, however, Dr. Otelia Sergeant is booked at 100% for multiple weeks.  Could you tell me where you would like for patient to be added?  Will need to forward message back to [place] once patient is called. Thanks.

## 2021-05-23 ENCOUNTER — Ambulatory Visit (INDEPENDENT_AMBULATORY_CARE_PROVIDER_SITE_OTHER): Payer: 59 | Admitting: Specialist

## 2021-05-23 ENCOUNTER — Other Ambulatory Visit: Payer: Self-pay

## 2021-05-23 ENCOUNTER — Encounter: Payer: Self-pay | Admitting: Specialist

## 2021-05-23 ENCOUNTER — Telehealth: Payer: Self-pay | Admitting: Radiology

## 2021-05-23 VITALS — BP 142/90 | HR 85 | Ht 69.0 in | Wt 171.0 lb

## 2021-05-23 DIAGNOSIS — M5030 Other cervical disc degeneration, unspecified cervical region: Secondary | ICD-10-CM

## 2021-05-23 DIAGNOSIS — M4722 Other spondylosis with radiculopathy, cervical region: Secondary | ICD-10-CM

## 2021-05-23 DIAGNOSIS — M960 Pseudarthrosis after fusion or arthrodesis: Secondary | ICD-10-CM | POA: Diagnosis not present

## 2021-05-23 NOTE — Patient Instructions (Signed)
Avoid overhead lifting and overhead use of the arms. Do not lift greater than 5 lbs. Adjust head rest in vehicle to prevent hyperextension if rear ended. Take extra precautions to avoid falling. Surgical treatment that would help to heal the nonunion or pseudoarthrosis at C5-6 and left sided C6-7 foramenotomy for decompression of the left C7 nerve root.  Avoid overhead lifting and overhead use of the arms. . Take extra precautions to avoid falling, including use of a cane if you feel weak. Scheduling secretary Tivis Ringer. will call you to arrange for surgery for your cervical spine. If you wish a second opinion please let us know and we can arrange for you. If you have worsening arm or leg numbness or weakness please call or go to an ER. We will contact your cardiologist and primary care physicians to seek clearance for your surgery. Surgery will be an posterior fusion at the C5-6 level with wiring of and bone grafting. Local bone graft as well and allograft bone graft and vivigen. Also surgical treatment of the left C6-7 foramenal narrowing with foramenotomy would be done at the same surgery.  Risks of surgery include risks of infection, bleeding and risks to the spinal cord and   Surgery is indicated due to upper extremity radiculopathy, and persisent pain following ACDF likely due to nonhealing fusion at C5-6 and narrowing of the foramen at C6-7 one the left side.  In the future surgery at adjacent levels may be necessary but these levels do not appear to be related to your current symptoms or signs.

## 2021-05-23 NOTE — Telephone Encounter (Signed)
-----   Message from Corrie Dandy sent at 05/23/2021 10:32 AM EDT ----- Patient would like to have surgery. He don't think Dr. Otelia Sergeant heard him in the exam room.

## 2021-05-23 NOTE — Telephone Encounter (Signed)
Blue sheet started and given to Dr. Otelia Sergeant to complete.

## 2021-05-23 NOTE — Progress Notes (Signed)
Office Visit Note   Patient: Victor Huang           Date of Birth: Mar 01, 1976           MRN: [id number] Visit Date: 05/23/2021              Requested by: No referring provider defined for this encounter. PCP: Patient, No Pcp Per (Inactive)   Assessment & Plan: Visit Diagnoses: No diagnosis found.  Plan: Avoid overhead lifting and overhead use of the arms. Do not lift greater than 5 lbs. Adjust head rest in vehicle to prevent hyperextension if rear ended. Take extra precautions to avoid falling. Surgical treatment that would help to heal the nonunion or pseudoarthrosis at C5-6 and left sided C6-7 foramenotomy for decompression of the left C7 nerve root.  Avoid overhead lifting and overhead use of the arms. . Take extra precautions to avoid falling, including use of a cane if you feel weak. Scheduling secretary Tivis Ringer. will call you to arrange for surgery for your cervical spine. If you wish a second opinion please let us know and we can arrange for you. If you have worsening arm or leg numbness or weakness please call or go to an ER. We will contact your cardiologist and primary care physicians to seek clearance for your surgery. Surgery will be an posterior fusion at the C5-6 level with wiring of and bone grafting. Local bone graft as well and allograft bone graft and vivigen. Also surgical treatment of the left C6-7 foramenal narrowing with foramenotomy would be done at the same surgery.  Risks of surgery include risks of infection, bleeding and risks to the spinal cord and   Surgery is indicated due to upper extremity radiculopathy, and persisent pain following ACDF likely due to nonhealing fusion at C5-6 and narrowing of the foramen at C6-7 one the left side.  In the future surgery at adjacent levels may be necessary but these levels do not appear to be related to your current symptoms or signs.  Follow-Up Instructions: Return in about 3 months (around 08/23/2021).    Orders:  No orders of the defined types were placed in this encounter.  No orders of the defined types were placed in this encounter.     Procedures: No procedures performed   Clinical Data: No additional findings.   Subjective: Chief Complaint  Patient presents with  . Neck - Follow-up  . Left Arm - Follow-up    EMG/NCS Review    HPI  Review of Systems   Objective: Vital Signs: BP (!) 142/90 (BP Location: Left Arm, Patient Position: Sitting)   Pulse 85   Ht 5\' 9"  (1.753 m)   Wt 171 lb (77.6 kg)   BMI 25.25 kg/m   Physical Exam  Ortho Exam  Specialty Comments:  No specialty comments available.  Imaging: No results found.   PMFS History: There are no problems to display for this patient.  History reviewed. No pertinent past medical history.  History reviewed. No pertinent family history.  History reviewed. No pertinent surgical history. Social History   Occupational History  . Not on file  Tobacco Use  . Smoking status: Current Every Day Smoker    Packs/day: 2.00    Types: Cigarettes    Start date: 01/01/1987  . Smokeless tobacco: Never Used  Substance and Sexual Activity  . Alcohol use: Never    Comment: no beer in over 15 years  . Drug use: Yes  . Sexual activity: Yes

## 2021-05-30 ENCOUNTER — Ambulatory Visit: Payer: 59 | Admitting: Surgery

## 2021-06-06 ENCOUNTER — Other Ambulatory Visit: Payer: Self-pay

## 2021-06-08 ENCOUNTER — Other Ambulatory Visit: Payer: Self-pay | Admitting: Specialist

## 2021-06-08 ENCOUNTER — Other Ambulatory Visit: Payer: Self-pay | Admitting: Surgery

## 2021-06-08 MED ORDER — HYDROCODONE-ACETAMINOPHEN 5-325 MG PO TABS: 1.0000 | ORAL_TABLET | Freq: Two times a day (BID) | ORAL | 0 refills | Status: DC | PRN

## 2021-06-08 NOTE — Telephone Encounter (Signed)
Sent request to [name] 

## 2021-06-08 NOTE — Telephone Encounter (Signed)
IC advised done 

## 2021-06-08 NOTE — Telephone Encounter (Signed)
Pt called asking to have a refill of his norco 5-325 mg rx called in, and he would like to be updated when this is done.   [phone]

## 2021-06-13 ENCOUNTER — Ambulatory Visit (INDEPENDENT_AMBULATORY_CARE_PROVIDER_SITE_OTHER): Payer: 59 | Admitting: Surgery

## 2021-06-13 ENCOUNTER — Encounter: Payer: Self-pay | Admitting: Surgery

## 2021-06-13 VITALS — BP 115/81 | HR 92 | Ht 69.0 in | Wt 171.0 lb

## 2021-06-13 DIAGNOSIS — M960 Pseudarthrosis after fusion or arthrodesis: Secondary | ICD-10-CM

## 2021-06-13 NOTE — Progress Notes (Signed)
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  States that symptoms unchanged from previous visit.  He is wanting to proceed with POSTERIOR CERVICAL FUSION C5-6 WITH TRIPLE WIRE TECHNIQUE FIXATION AND RIGHT ILIAC CREST BONE GRAFT HARVEST, LEFT C6-7 CERVICAL FORAMINAL LAMINOPLASTY as scheduled.  Today history and physical performed.  Review of systems negative.  Patient's PCP is Mauricio Po in [place] and states that he was last seen by her January 2022.  Patient has a new complaint today of right lateral elbow pain.  States that this is been ongoing x2 weeks.  No injury.  Pain with gripping and lifting objects.  Patient is right-hand dominant.  Exam Very pleasant male alert and oriented in no acute distress.  Lungs CTA.  RRR.  Right elbow good range of motion.  He has mild swelling over the lateral epicondyle.  He is marked tender over the lateral epicondyle.  Lateral elbow pain increased with grip testing and resisted forearm supination and wrist extension    Plan We will proceed with surgery as scheduled.  Surgical procedure discussed in detail.  All questions answered.  In regards to his right tennis elbow I advised him to use over-the-counter Voltaren gel and to do some stretching exercises.  This was demonstrated.  In a few weeks if he continues have ongoing issues I did discuss doing a tennis elbow injection.

## 2021-06-13 NOTE — Progress Notes (Signed)
Surgical Instructions   Your procedure is scheduled on Monday 06/18/21.  Report to [facility] Main Entrance "A" at 05:30 A.M., then check in with the Admitting office.  Call [phone] if you have problems or questions between now and the morning of surgery:   Remember: Do not eat after midnight the night before your surgery  You may drink clear liquids until 04:30am the morning of your surgery.   Clear liquids allowed are: Water, Non-Citrus Juices (without pulp), Carbonated Beverages, Clear Tea, Black Coffee Only, and Gatorade   Enhanced Recovery after Surgery for Orthopedics Enhanced Recovery after Surgery is a protocol used to improve the stress on your body and your recovery after surgery.  Patient Instructions  The day of surgery (if you do NOT have diabetes):  Drink ONE (1) Pre-Surgery Clear Ensure by 04:30 am the morning of surgery   This drink was given to you during your hospital  pre-op appointment visit. Nothing else to drink after completing the  Pre-Surgery Clear Ensure.         If you have questions, please contact your surgeon's office.    Take these medicines the morning of surgery with A SIP OF WATER  Hydrocodone-acetaminophen (Norco/Vicodin) - if needed   As of today, STOP taking any Aspirin (unless otherwise instructed by your surgeon) or Aspirin-containing products; NSAIDS - Aleve, Naproxen, Ibuprofen, Motrin, Advil, Goody's, BC's, all herbal medications, fish oil, and all vitamins.          Do not wear jewelry  Do not wear lotions, powders, perfumes/colognes, or deodorant. Men may shave face and neck. Do not bring valuables to the hospital - [facility] is not responsible for any belongings or valuables.              Do NOT Smoke (Tobacco/Vaping) or drink Alcohol 24 hours prior to your procedure  If you use a CPAP at night, you may bring all equipment for your overnight stay.   Contacts, glasses, hearing aids, dentures or partials may not be worn  into surgery, please bring cases for these belongings   For patients admitted to the hospital, discharge time will be determined by your treatment team.   Patients discharged the day of surgery will not be allowed to drive home, and someone needs to stay with them for 24 hours.  ONLY ONE (1) SUPPORT PERSON MAY WAIT IN THE WAITING AREA WHILE YOU ARE IN SURGERY. NO VISITORS WILL BE ALLOWED IN PRE-OP WHERE PATIENTS GET READY FOR SURGERY.  TWO (2) VISITORS WILL BE ALLOWED IN YOUR ROOM IF YOU ARE ADMITTED AFTER SURGERY.  Minor children may have two parents present. Special consideration for safety and communication needs will be reviewed on a case by case basis.  Special instructions:    Oral Hygiene is also important to reduce your risk of infection.  Remember - BRUSH YOUR TEETH THE MORNING OF SURGERY WITH YOUR REGULAR TOOTHPASTE   [place]- Preparing For Surgery  Before surgery, you can play an important role. Because skin is not sterile, your skin needs to be as free of germs as possible. You can reduce the number of germs on your skin by washing with CHG (chlorahexidine gluconate) Soap before surgery.  CHG is an antiseptic cleaner which kills germs and bonds with the skin to continue killing germs even after washing.     Please do not use if you have an allergy to CHG or antibacterial soaps. If your skin becomes reddened/irritated stop using the CHG.  Do not shave (including legs and underarms) for at least 48 hours prior to first CHG shower. It is OK to shave your face.  Please follow these instructions carefully.     Shower the NIGHT BEFORE SURGERY and the MORNING OF SURGERY with CHG Soap.   If you chose to wash your hair, wash your hair first as usual with your normal shampoo. After you shampoo, rinse your hair and body thoroughly to remove the shampoo.    Then [employer] and genitals (private parts) with your normal soap and rinse thoroughly to remove soap.  Next use the CHG Soap  as you would any other liquid soap. You can apply CHG directly to the skin and wash gently with a clean washcloth.   Apply the CHG Soap to your body ONLY FROM THE NECK DOWN.  Do not use on open wounds or open sores. Avoid contact with your eyes, ears, mouth and genitals (private parts). Wash Face and genitals (private parts)  with your normal soap.   Wash thoroughly, paying special attention to the area where your surgery will be performed.  Thoroughly rinse your body with warm water from the neck down.  DO NOT shower/wash with your normal soap after using and rinsing off the CHG Soap.  Pat yourself dry with a CLEAN TOWEL.  Wear CLEAN PAJAMAS to bed the night before surgery  Place CLEAN SHEETS on your bed the night before your surgery  DO NOT SLEEP WITH PETS.   Day of Surgery:  Take a shower with CHG soap. Wear Clean/Comfortable clothing the morning of surgery Do not apply any deodorants/lotions.   Remember to brush your teeth WITH YOUR REGULAR TOOTHPASTE.   Please read over the following fact sheets that you were given.

## 2021-06-14 ENCOUNTER — Encounter (HOSPITAL_COMMUNITY)
Admission: RE | Admit: 2021-06-14 | Discharge: 2021-06-14 | Disposition: A | Discharge status: Home / Self Care | Payer: 59 | Source: Ambulatory Visit | Attending: Specialist | Admitting: Specialist

## 2021-06-14 ENCOUNTER — Encounter (HOSPITAL_COMMUNITY): Payer: Self-pay

## 2021-06-14 ENCOUNTER — Other Ambulatory Visit: Payer: Self-pay

## 2021-06-14 DIAGNOSIS — Z01818 Encounter for other preprocedural examination: Secondary | ICD-10-CM | POA: Diagnosis present

## 2021-06-14 DIAGNOSIS — Z20822 Contact with and (suspected) exposure to covid-19: Secondary | ICD-10-CM | POA: Insufficient documentation

## 2021-06-14 DIAGNOSIS — I4510 Unspecified right bundle-branch block: Secondary | ICD-10-CM | POA: Diagnosis not present

## 2021-06-14 HISTORY — DX: Unspecified asthma, uncomplicated: J45.909

## 2021-06-14 HISTORY — DX: Gastro-esophageal reflux disease without esophagitis: K21.9

## 2021-06-14 HISTORY — DX: Headache, unspecified: R51.9

## 2021-06-14 HISTORY — DX: Pneumonia, unspecified organism: J18.9

## 2021-06-14 LAB — COMPREHENSIVE METABOLIC PANEL
ALT: 32 U/L (ref 0–44)
AST: 25 U/L (ref 15–41)
Albumin: 4.2 g/dL (ref 3.5–5.0)
Alkaline Phosphatase: 62 U/L (ref 38–126)
Anion gap: 8 (ref 5–15)
BUN: 19 mg/dL (ref 6–20)
CO2: 25 mmol/L (ref 22–32)
Calcium: 9.4 mg/dL (ref 8.9–10.3)
Chloride: 106 mmol/L (ref 98–111)
Creatinine, Ser: 0.86 mg/dL (ref 0.61–1.24)
GFR, Estimated: >60 mL/min (ref >60)
Glucose, Bld: 98 mg/dL (ref 70–99)
Potassium: 3.9 mmol/L (ref 3.5–5.1)
Sodium: 139 mmol/L (ref 135–145)
Total Bilirubin: 0.8 mg/dL (ref 0.3–1.2)
Total Protein: 7.4 g/dL (ref 6.5–8.1)

## 2021-06-14 LAB — CBC
HCT: 47.4 % (ref 39.0–52.0)
Hemoglobin: 16 g/dL (ref 13.0–17.0)
MCH: 29.2 pg (ref 26.0–34.0)
MCHC: 33.8 g/dL (ref 30.0–36.0)
MCV: 86.5 fL (ref 80.0–100.0)
Platelets: 405 10*3/uL — ABNORMAL HIGH (ref 150–400)
RBC: 5.48 MIL/uL (ref 4.22–5.81)
RDW: 13.2 % (ref 11.5–15.5)
WBC: 14.5 10*3/uL — ABNORMAL HIGH (ref 4.0–10.5)
nRBC: 0 % (ref 0.0–0.2)

## 2021-06-14 LAB — TYPE AND SCREEN
ABO/RH(D): B POS
Antibody Screen: NEGATIVE

## 2021-06-14 LAB — PROTIME-INR
INR: 1.1 (ref 0.8–1.2)
Prothrombin Time: 13.7 seconds (ref 11.4–15.2)

## 2021-06-14 LAB — SURGICAL PCR SCREEN
MRSA, PCR: NEGATIVE
Staphylococcus aureus: POSITIVE — AB

## 2021-06-14 LAB — SARS CORONAVIRUS 2 (TAT 6-24 HRS): SARS Coronavirus 2: NEGATIVE

## 2021-06-14 NOTE — Progress Notes (Addendum)
PCP - Mauricio Po Cardiologist - denies  PPM/ICD - denies   Chest x-ray - n/a EKG - 06/14/21 Stress Test - denies ECHO - denies Cardiac Cath - denies  Sleep Study - denies   No diabetes  As of today, STOP taking any Aspirin (unless otherwise instructed by your surgeon) or Aspirin-containing products; NSAIDS - Aleve, Naproxen, Ibuprofen, Motrin, Advil, Goody's, BC's, all herbal medications, fish oil, and all vitamins.  ERAS Protcol -yes PRE-SURGERY Ensure or G2- ensure and instructions given  COVID TEST- completed in PAT 06/14/21   Anesthesia review: yes, abnormal ekg  Patient denies shortness of breath, fever, cough and chest pain at PAT appointment   All instructions explained to the patient, with a verbal understanding of the material. Patient agrees to go over the instructions while at home for a better understanding. Patient also instructed to self quarantine after being tested for COVID-19. The opportunity to ask questions was provided.

## 2021-06-17 NOTE — Anesthesia Preprocedure Evaluation (Addendum)
Anesthesia Evaluation  Patient identified by MRN, date of birth, ID band Patient awake    Reviewed: Allergy & Precautions, NPO status , Patient's Chart, lab work & pertinent test results  Airway Mallampati: II  TM Distance: >3 FB Neck ROM: Full    Dental  (+) Teeth Intact   Pulmonary asthma , Current Smoker and Patient abstained from smoking.,    Pulmonary exam normal        Cardiovascular negative cardio ROS   Rhythm:Regular Rate:Normal     Neuro/Psych  Headaches, negative psych ROS   GI/Hepatic Neg liver ROS, GERD  ,  Endo/Other  negative endocrine ROS  Renal/GU negative Renal ROS  negative genitourinary   Musculoskeletal Cervical spondylolisthesis    Abdominal (+)  Abdomen: soft. Bowel sounds: normal.  Peds  Hematology negative hematology ROS (+)   Anesthesia Other Findings   Reproductive/Obstetrics                            Anesthesia Physical Anesthesia Plan  ASA: 2  Anesthesia Plan: General   Post-op Pain Management:    Induction: Intravenous  PONV Risk Score and Plan: 1 and Ondansetron, Dexamethasone, Treatment may vary due to age or medical condition and Midazolam  Airway Management Planned: Mask and Oral ETT  Additional Equipment: None  Intra-op Plan:   Post-operative Plan: Extubation in OR  Informed Consent: I have reviewed the patients History and Physical, chart, labs and discussed the procedure including the risks, benefits and alternatives for the proposed anesthesia with the patient or authorized representative who has indicated his/her understanding and acceptance.     Dental advisory given  Plan Discussed with: CRNA  Anesthesia Plan Comments: (Lab Results      Component                Value               Date                      WBC                      14.5 (H)            06/14/2021                HGB                      16.0                 06/14/2021                HCT                      47.4                06/14/2021                MCV                      86.5                06/14/2021                PLT                      405 (H)  06/14/2021           Lab Results      Component                Value               Date                      NA                       139                 06/14/2021                K                        3.9                 06/14/2021                CO2                      25                  06/14/2021                GLUCOSE                  98                  06/14/2021                BUN                      19                  06/14/2021                CREATININE               0.86                06/14/2021                CALCIUM                  9.4                 06/14/2021                GFRNONAA                 >60                 06/14/2021          )       Anesthesia Quick Evaluation

## 2021-06-18 ENCOUNTER — Encounter (HOSPITAL_COMMUNITY): Payer: Self-pay | Admitting: Specialist

## 2021-06-18 ENCOUNTER — Ambulatory Visit (HOSPITAL_COMMUNITY): Payer: 59 | Admitting: Anesthesiology

## 2021-06-18 ENCOUNTER — Ambulatory Visit (HOSPITAL_COMMUNITY)
Admission: RE | Disposition: A | Discharge status: Home / Self Care | Payer: Self-pay | Source: Home / Self Care | Attending: Specialist

## 2021-06-18 ENCOUNTER — Ambulatory Visit (HOSPITAL_COMMUNITY): Payer: 59

## 2021-06-18 ENCOUNTER — Ambulatory Visit (HOSPITAL_COMMUNITY): Payer: 59 | Admitting: Vascular Surgery

## 2021-06-18 ENCOUNTER — Observation Stay (HOSPITAL_COMMUNITY): Payer: 59

## 2021-06-18 ENCOUNTER — Observation Stay (HOSPITAL_COMMUNITY)
Admission: RE | Admit: 2021-06-18 | Discharge: 2021-06-19 | Disposition: A | Discharge status: Home / Self Care | Payer: 59 | Attending: Specialist | Admitting: Specialist

## 2021-06-18 ENCOUNTER — Other Ambulatory Visit: Payer: Self-pay

## 2021-06-18 DIAGNOSIS — F17210 Nicotine dependence, cigarettes, uncomplicated: Secondary | ICD-10-CM | POA: Diagnosis not present

## 2021-06-18 DIAGNOSIS — Z981 Arthrodesis status: Secondary | ICD-10-CM

## 2021-06-18 DIAGNOSIS — M4722 Other spondylosis with radiculopathy, cervical region: Secondary | ICD-10-CM

## 2021-06-18 DIAGNOSIS — M4802 Spinal stenosis, cervical region: Secondary | ICD-10-CM | POA: Insufficient documentation

## 2021-06-18 DIAGNOSIS — J45909 Unspecified asthma, uncomplicated: Secondary | ICD-10-CM | POA: Insufficient documentation

## 2021-06-18 DIAGNOSIS — M50122 Cervical disc disorder at C5-C6 level with radiculopathy: Secondary | ICD-10-CM | POA: Insufficient documentation

## 2021-06-18 DIAGNOSIS — M960 Pseudarthrosis after fusion or arthrodesis: Secondary | ICD-10-CM | POA: Insufficient documentation

## 2021-06-18 DIAGNOSIS — Z419 Encounter for procedure for purposes other than remedying health state, unspecified: Secondary | ICD-10-CM

## 2021-06-18 HISTORY — DX: Other spondylosis with radiculopathy, cervical region: M47.22

## 2021-06-18 HISTORY — PX: POSTERIOR CERVICAL FUSION/FORAMINOTOMY: SHX5038

## 2021-06-18 HISTORY — DX: Arthrodesis status: Z98.1

## 2021-06-18 HISTORY — DX: Pseudarthrosis after fusion or arthrodesis: M96.0

## 2021-06-18 LAB — ABO/RH: ABO/RH(D): B POS

## 2021-06-18 SURGERY — POSTERIOR CERVICAL FUSION/FORAMINOTOMY LEVEL 2
Anesthesia: General

## 2021-06-18 MED ORDER — PHENYLEPHRINE HCL-NACL 10-0.9 MG/250ML-% IV SOLN
INTRAVENOUS | Status: DC | PRN
  Administered 2021-06-18: 80 ug/min via INTRAVENOUS

## 2021-06-18 MED ORDER — PANTOPRAZOLE SODIUM 40 MG PO TBEC
40.0000 mg | DELAYED_RELEASE_TABLET | Freq: Every day | ORAL | Status: DC
  Administered 2021-06-18: 40 mg via ORAL
  Filled 2021-06-18: qty 1

## 2021-06-18 MED ORDER — OXYCODONE HCL 5 MG/5ML PO SOLN: 5.0000 mg | Freq: Once | ORAL | Status: DC | PRN

## 2021-06-18 MED ORDER — METHOCARBAMOL 1000 MG/10ML IJ SOLN
500.0000 mg | Freq: Four times a day (QID) | INTRAVENOUS | Status: DC | PRN
  Filled 2021-06-18: qty 5

## 2021-06-18 MED ORDER — GABAPENTIN 300 MG PO CAPS
300.0000 mg | ORAL_CAPSULE | Freq: Three times a day (TID) | ORAL | Status: DC
  Administered 2021-06-18 – 2021-06-19 (×3): 300 mg via ORAL
  Filled 2021-06-18 (×3): qty 1

## 2021-06-18 MED ORDER — BUPIVACAINE LIPOSOME 1.3 % IJ SUSP
INTRAMUSCULAR | Status: AC
  Filled 2021-06-18: qty 20

## 2021-06-18 MED ORDER — FLEET ENEMA 7-19 GM/118ML RE ENEM: 1.0000 | ENEMA | Freq: Once | RECTAL | Status: DC | PRN

## 2021-06-18 MED ORDER — ACETAMINOPHEN 10 MG/ML IV SOLN: 1000.0000 mg | Freq: Once | INTRAVENOUS | Status: DC | PRN

## 2021-06-18 MED ORDER — FENTANYL CITRATE (PF) 250 MCG/5ML IJ SOLN
INTRAMUSCULAR | Status: AC
  Filled 2021-06-18: qty 5

## 2021-06-18 MED ORDER — EPHEDRINE SULFATE-NACL 50-0.9 MG/10ML-% IV SOSY
PREFILLED_SYRINGE | INTRAVENOUS | Status: DC | PRN
  Administered 2021-06-18 (×2): 10 mg via INTRAVENOUS

## 2021-06-18 MED ORDER — PROPOFOL 10 MG/ML IV BOLUS
INTRAVENOUS | Status: AC
  Filled 2021-06-18: qty 20

## 2021-06-18 MED ORDER — CHLORHEXIDINE GLUCONATE 0.12 % MT SOLN
OROMUCOSAL | Status: AC
  Filled 2021-06-18: qty 15

## 2021-06-18 MED ORDER — ACETAMINOPHEN 10 MG/ML IV SOLN
INTRAVENOUS | Status: DC | PRN
  Administered 2021-06-18: 1000 mg via INTRAVENOUS

## 2021-06-18 MED ORDER — HEMOSTATIC AGENTS (NO CHARGE) OPTIME
TOPICAL | Status: DC | PRN
  Administered 2021-06-18: 1

## 2021-06-18 MED ORDER — BUPIVACAINE LIPOSOME 1.3 % IJ SUSP
10.0000 mL | Freq: Once | INTRAMUSCULAR | Status: DC
  Filled 2021-06-18: qty 10

## 2021-06-18 MED ORDER — POLYETHYLENE GLYCOL 3350 17 G PO PACK: 17.0000 g | PACK | Freq: Every day | ORAL | Status: DC | PRN

## 2021-06-18 MED ORDER — ONDANSETRON HCL 4 MG/2ML IJ SOLN
INTRAMUSCULAR | Status: DC | PRN
  Administered 2021-06-18: 4 mg via INTRAVENOUS

## 2021-06-18 MED ORDER — PHENOL 1.4 % MT LIQD: 1.0000 | OROMUCOSAL | Status: DC | PRN

## 2021-06-18 MED ORDER — DEXAMETHASONE SODIUM PHOSPHATE 10 MG/ML IJ SOLN
INTRAMUSCULAR | Status: DC | PRN
  Administered 2021-06-18: 10 mg via INTRAVENOUS

## 2021-06-18 MED ORDER — ALBUMIN HUMAN 5 % IV SOLN: INTRAVENOUS | Status: DC | PRN

## 2021-06-18 MED ORDER — BUPIVACAINE HCL 0.5 % IJ SOLN
INTRAMUSCULAR | Status: DC | PRN
  Administered 2021-06-18: 15 mL

## 2021-06-18 MED ORDER — ROCURONIUM BROMIDE 10 MG/ML (PF) SYRINGE
PREFILLED_SYRINGE | INTRAVENOUS | Status: AC
  Filled 2021-06-18: qty 10

## 2021-06-18 MED ORDER — HYDROMORPHONE HCL 1 MG/ML IJ SOLN
INTRAMUSCULAR | Status: DC | PRN
  Administered 2021-06-18: .5 mg via INTRAVENOUS

## 2021-06-18 MED ORDER — MIDAZOLAM HCL 2 MG/2ML IJ SOLN
INTRAMUSCULAR | Status: DC | PRN
  Administered 2021-06-18: 2 mg via INTRAVENOUS

## 2021-06-18 MED ORDER — BUPIVACAINE LIPOSOME 1.3 % IJ SUSP
INTRAMUSCULAR | Status: DC | PRN
  Administered 2021-06-18: 15 mL

## 2021-06-18 MED ORDER — ACETAMINOPHEN 500 MG PO TABS: 1000.0000 mg | ORAL_TABLET | Freq: Four times a day (QID) | ORAL | Status: DC

## 2021-06-18 MED ORDER — CEFAZOLIN SODIUM-DEXTROSE 2-4 GM/100ML-% IV SOLN
INTRAVENOUS | Status: AC
  Filled 2021-06-18: qty 100

## 2021-06-18 MED ORDER — DEXAMETHASONE SODIUM PHOSPHATE 10 MG/ML IJ SOLN
8.0000 mg | Freq: Four times a day (QID) | INTRAMUSCULAR | Status: AC
  Administered 2021-06-18 – 2021-06-19 (×2): 8 mg via INTRAVENOUS
  Filled 2021-06-18 (×2): qty 1

## 2021-06-18 MED ORDER — ROCURONIUM BROMIDE 10 MG/ML (PF) SYRINGE
PREFILLED_SYRINGE | INTRAVENOUS | Status: DC | PRN
  Administered 2021-06-18: 60 mg via INTRAVENOUS
  Administered 2021-06-18: 20 mg via INTRAVENOUS
  Administered 2021-06-18 (×2): 40 mg via INTRAVENOUS

## 2021-06-18 MED ORDER — MORPHINE SULFATE (PF) 2 MG/ML IV SOLN
1.0000 mg | INTRAVENOUS | Status: DC | PRN
  Administered 2021-06-18: 1 mg via INTRAVENOUS
  Filled 2021-06-18: qty 1

## 2021-06-18 MED ORDER — HYDROMORPHONE HCL 1 MG/ML IJ SOLN
INTRAMUSCULAR | Status: AC
  Filled 2021-06-18: qty 2

## 2021-06-18 MED ORDER — CEFAZOLIN SODIUM-DEXTROSE 2-4 GM/100ML-% IV SOLN
2.0000 g | Freq: Three times a day (TID) | INTRAVENOUS | Status: AC
  Administered 2021-06-18 – 2021-06-19 (×2): 2 g via INTRAVENOUS
  Filled 2021-06-18 (×2): qty 100

## 2021-06-18 MED ORDER — HYDROCODONE-ACETAMINOPHEN 7.5-325 MG PO TABS: 1.0000 | ORAL_TABLET | ORAL | Status: DC | PRN

## 2021-06-18 MED ORDER — EPHEDRINE 5 MG/ML INJ
INTRAVENOUS | Status: AC
  Filled 2021-06-18: qty 10

## 2021-06-18 MED ORDER — ONDANSETRON HCL 4 MG PO TABS: 4.0000 mg | ORAL_TABLET | Freq: Four times a day (QID) | ORAL | Status: DC | PRN

## 2021-06-18 MED ORDER — OXYCODONE-ACETAMINOPHEN 5-325 MG PO TABS
1.0000 | ORAL_TABLET | Freq: Four times a day (QID) | ORAL | Status: DC | PRN
  Administered 2021-06-18 – 2021-06-19 (×3): 2 via ORAL
  Filled 2021-06-18 (×3): qty 2

## 2021-06-18 MED ORDER — MENTHOL 3 MG MT LOZG: 1.0000 | LOZENGE | OROMUCOSAL | Status: DC | PRN

## 2021-06-18 MED ORDER — LIDOCAINE HCL (PF) 2 % IJ SOLN
INTRAMUSCULAR | Status: AC
  Filled 2021-06-18: qty 5

## 2021-06-18 MED ORDER — HYDROMORPHONE HCL 1 MG/ML IJ SOLN
INTRAMUSCULAR | Status: AC
  Filled 2021-06-18: qty 0.5

## 2021-06-18 MED ORDER — ONDANSETRON HCL 4 MG/2ML IJ SOLN
INTRAMUSCULAR | Status: AC
  Filled 2021-06-18: qty 2

## 2021-06-18 MED ORDER — ALUM & MAG HYDROXIDE-SIMETH 200-200-20 MG/5ML PO SUSP: 30.0000 mL | Freq: Four times a day (QID) | ORAL | Status: DC | PRN

## 2021-06-18 MED ORDER — DEXMEDETOMIDINE (PRECEDEX) IN NS 20 MCG/5ML (4 MCG/ML) IV SYRINGE
PREFILLED_SYRINGE | INTRAVENOUS | Status: DC | PRN
  Administered 2021-06-18: 4 ug via INTRAVENOUS
  Administered 2021-06-18 (×3): 8 ug via INTRAVENOUS
  Administered 2021-06-18: 4 ug via INTRAVENOUS

## 2021-06-18 MED ORDER — AMITRIPTYLINE HCL 25 MG PO TABS
75.0000 mg | ORAL_TABLET | Freq: Every day | ORAL | Status: DC
  Administered 2021-06-18: 75 mg via ORAL
  Filled 2021-06-18: qty 1
  Filled 2021-06-18 (×2): qty 3

## 2021-06-18 MED ORDER — PROPOFOL 10 MG/ML IV BOLUS
INTRAVENOUS | Status: DC | PRN
  Administered 2021-06-18: 180 mg via INTRAVENOUS

## 2021-06-18 MED ORDER — METHOCARBAMOL 500 MG PO TABS
ORAL_TABLET | ORAL | Status: AC
  Filled 2021-06-18: qty 1

## 2021-06-18 MED ORDER — HYDROCODONE-ACETAMINOPHEN 7.5-325 MG PO TABS: 2.0000 | ORAL_TABLET | ORAL | Status: DC | PRN

## 2021-06-18 MED ORDER — BUPIVACAINE HCL (PF) 0.5 % IJ SOLN
INTRAMUSCULAR | Status: AC
  Filled 2021-06-18: qty 30

## 2021-06-18 MED ORDER — MIDAZOLAM HCL 2 MG/2ML IJ SOLN
INTRAMUSCULAR | Status: AC
  Filled 2021-06-18: qty 2

## 2021-06-18 MED ORDER — PROMETHAZINE HCL 25 MG/ML IJ SOLN
6.2500 mg | INTRAMUSCULAR | Status: DC | PRN
Start: 2021-06-18 — End: 2021-06-18

## 2021-06-18 MED ORDER — THROMBIN 20000 UNITS EX SOLR
CUTANEOUS | Status: DC | PRN
  Administered 2021-06-18: 20000 [IU] via TOPICAL

## 2021-06-18 MED ORDER — HYDROMORPHONE HCL 1 MG/ML IJ SOLN
0.2500 mg | INTRAMUSCULAR | Status: DC | PRN
Start: 2021-06-18 — End: 2021-06-18
  Administered 2021-06-18: 0.5 mg via INTRAVENOUS
  Administered 2021-06-18: 0.25 mg via INTRAVENOUS
  Administered 2021-06-18: 0.5 mg via INTRAVENOUS

## 2021-06-18 MED ORDER — SODIUM CHLORIDE 0.9 % IV SOLN: INTRAVENOUS | Status: DC

## 2021-06-18 MED ORDER — DEXAMETHASONE SODIUM PHOSPHATE 10 MG/ML IJ SOLN
INTRAMUSCULAR | Status: AC
  Filled 2021-06-18: qty 1

## 2021-06-18 MED ORDER — ACETAMINOPHEN 650 MG RE SUPP: 650.0000 mg | RECTAL | Status: DC | PRN

## 2021-06-18 MED ORDER — FENTANYL CITRATE (PF) 250 MCG/5ML IJ SOLN
INTRAMUSCULAR | Status: DC | PRN
  Administered 2021-06-18 (×5): 50 ug via INTRAVENOUS

## 2021-06-18 MED ORDER — SODIUM CHLORIDE 0.9% FLUSH
3.0000 mL | Freq: Two times a day (BID) | INTRAVENOUS | Status: DC
  Administered 2021-06-18: 3 mL via INTRAVENOUS

## 2021-06-18 MED ORDER — SUGAMMADEX SODIUM 200 MG/2ML IV SOLN
INTRAVENOUS | Status: DC | PRN
  Administered 2021-06-18: 200 mg via INTRAVENOUS

## 2021-06-18 MED ORDER — 0.9 % SODIUM CHLORIDE (POUR BTL) OPTIME
TOPICAL | Status: DC | PRN
  Administered 2021-06-18: 1000 mL

## 2021-06-18 MED ORDER — DEXMEDETOMIDINE (PRECEDEX) IN NS 20 MCG/5ML (4 MCG/ML) IV SYRINGE
PREFILLED_SYRINGE | INTRAVENOUS | Status: AC
  Filled 2021-06-18: qty 10

## 2021-06-18 MED ORDER — HYDROCODONE-ACETAMINOPHEN 7.5-325 MG PO TABS
1.0000 | ORAL_TABLET | Freq: Four times a day (QID) | ORAL | Status: DC
  Administered 2021-06-18 – 2021-06-19 (×3): 1 via ORAL
  Filled 2021-06-18 (×3): qty 1

## 2021-06-18 MED ORDER — DOCUSATE SODIUM 100 MG PO CAPS
100.0000 mg | ORAL_CAPSULE | Freq: Two times a day (BID) | ORAL | Status: DC
  Administered 2021-06-18 – 2021-06-19 (×3): 100 mg via ORAL
  Filled 2021-06-18 (×3): qty 1

## 2021-06-18 MED ORDER — ORAL CARE MOUTH RINSE: 15.0000 mL | Freq: Once | OROMUCOSAL | Status: AC

## 2021-06-18 MED ORDER — BISACODYL 5 MG PO TBEC: 5.0000 mg | DELAYED_RELEASE_TABLET | Freq: Every day | ORAL | Status: DC | PRN

## 2021-06-18 MED ORDER — LACTATED RINGERS IV SOLN: INTRAVENOUS | Status: DC

## 2021-06-18 MED ORDER — CHLORHEXIDINE GLUCONATE 0.12 % MT SOLN
15.0000 mL | Freq: Once | OROMUCOSAL | Status: AC
  Administered 2021-06-18: 15 mL via OROMUCOSAL

## 2021-06-18 MED ORDER — ACETAMINOPHEN 325 MG PO TABS
650.0000 mg | ORAL_TABLET | ORAL | Status: DC | PRN
Start: 2021-06-18 — End: 2021-06-19

## 2021-06-18 MED ORDER — FENTANYL CITRATE (PF) 100 MCG/2ML IJ SOLN: 25.0000 ug | INTRAMUSCULAR | Status: DC | PRN

## 2021-06-18 MED ORDER — METHOCARBAMOL 500 MG PO TABS
500.0000 mg | ORAL_TABLET | Freq: Four times a day (QID) | ORAL | Status: DC | PRN
  Administered 2021-06-18 – 2021-06-19 (×4): 500 mg via ORAL
  Filled 2021-06-18 (×3): qty 1

## 2021-06-18 MED ORDER — SODIUM CHLORIDE 0.9% FLUSH: 3.0000 mL | INTRAVENOUS | Status: DC | PRN

## 2021-06-18 MED ORDER — LIDOCAINE HCL (CARDIAC) PF 100 MG/5ML IV SOSY
PREFILLED_SYRINGE | INTRAVENOUS | Status: DC | PRN
  Administered 2021-06-18: 80 mg via INTRATRACHEAL

## 2021-06-18 MED ORDER — THROMBIN (RECOMBINANT) 20000 UNITS EX SOLR
CUTANEOUS | Status: AC
  Filled 2021-06-18: qty 20000

## 2021-06-18 MED ORDER — ONDANSETRON HCL 4 MG/2ML IJ SOLN: 4.0000 mg | Freq: Four times a day (QID) | INTRAMUSCULAR | Status: DC | PRN

## 2021-06-18 MED ORDER — CEFAZOLIN SODIUM-DEXTROSE 2-4 GM/100ML-% IV SOLN
2.0000 g | INTRAVENOUS | Status: AC
  Administered 2021-06-18 (×2): 2 g via INTRAVENOUS

## 2021-06-18 MED ORDER — OXYCODONE HCL 5 MG PO TABS: 5.0000 mg | ORAL_TABLET | Freq: Once | ORAL | Status: DC | PRN

## 2021-06-18 MED ORDER — PANTOPRAZOLE SODIUM 40 MG IV SOLR: 40.0000 mg | Freq: Every day | INTRAVENOUS | Status: DC

## 2021-06-18 SURGICAL SUPPLY — 61 items
ADH SKN CLS APL DERMABOND .7 (GAUZE/BANDAGES/DRESSINGS)
BIT DRILL NEURO 2X3.1 SFT TUCH (MISCELLANEOUS) ×1 IMPLANT
BLADE CLIPPER SURG (BLADE) IMPLANT
BUR RND FLUTED 2.5 (BURR) ×2 IMPLANT
CABLE W/CRP 1.0 470 TI 1LD (Cable) ×4 IMPLANT
COLLAR CERV LO CONTOUR FIRM DE (SOFTGOODS) ×2 IMPLANT
COVER SURGICAL LIGHT HANDLE (MISCELLANEOUS) ×2 IMPLANT
COVER WAND RF STERILE (DRAPES) IMPLANT
DERMABOND ADVANCED (GAUZE/BANDAGES/DRESSINGS)
DERMABOND ADVANCED .7 DNX12 (GAUZE/BANDAGES/DRESSINGS) IMPLANT
DRAPE C-ARM 42X72 X-RAY (DRAPES) ×2 IMPLANT
DRAPE HALF SHEET 40X57 (DRAPES) ×4 IMPLANT
DRAPE LAPAROTOMY 100X72 PEDS (DRAPES) ×2 IMPLANT
DRAPE MICROSCOPE LEICA (MISCELLANEOUS) IMPLANT
DRAPE SURG 17X23 STRL (DRAPES) ×8 IMPLANT
DRESSING MEPILEX FLEX 4X4 (GAUZE/BANDAGES/DRESSINGS) ×1 IMPLANT
DRILL NEURO 2X3.1 SOFT TOUCH (MISCELLANEOUS) ×2
DRSG MEPILEX BORDER 4X4 (GAUZE/BANDAGES/DRESSINGS) IMPLANT
DRSG MEPILEX BORDER 4X8 (GAUZE/BANDAGES/DRESSINGS) IMPLANT
DRSG MEPILEX FLEX 4X4 (GAUZE/BANDAGES/DRESSINGS) ×2
DURAPREP 6ML APPLICATOR 50/CS (WOUND CARE) IMPLANT
ELECT CAUTERY BLADE 6.4 (BLADE) ×2 IMPLANT
ELECT REM PT RETURN 9FT ADLT (ELECTROSURGICAL) ×2
ELECTRODE REM PT RTRN 9FT ADLT (ELECTROSURGICAL) ×1 IMPLANT
EVACUATOR 1/8 PVC DRAIN (DRAIN) IMPLANT
GAUZE SPONGE 4X4 12PLY STRL (GAUZE/BANDAGES/DRESSINGS) ×2 IMPLANT
GAUZE XEROFORM 1X8 LF (GAUZE/BANDAGES/DRESSINGS) ×2 IMPLANT
GLOVE ECLIPSE 9.0 STRL (GLOVE) ×2 IMPLANT
GLOVE ORTHO TXT STRL SZ7.5 (GLOVE) ×2 IMPLANT
GLOVE SRG 8 PF TXTR STRL LF DI (GLOVE) ×1 IMPLANT
GLOVE SURG 8.5 LATEX PF (GLOVE) ×2 IMPLANT
GLOVE SURG UNDER POLY LF SZ8 (GLOVE) ×2
GOWN STRL REUS W/ TWL LRG LVL3 (GOWN DISPOSABLE) ×1 IMPLANT
GOWN STRL REUS W/TWL 2XL LVL3 (GOWN DISPOSABLE) ×4 IMPLANT
GOWN STRL REUS W/TWL LRG LVL3 (GOWN DISPOSABLE) ×2
KIT BASIN OR (CUSTOM PROCEDURE TRAY) ×2 IMPLANT
KIT TURNOVER KIT B (KITS) ×2 IMPLANT
MANIFOLD NEPTUNE II (INSTRUMENTS) ×2 IMPLANT
NEEDLE SPNL 18GX3.5 QUINCKE PK (NEEDLE) ×2 IMPLANT
NS IRRIG 1000ML POUR BTL (IV SOLUTION) ×2 IMPLANT
PACK ORTHO CERVICAL (CUSTOM PROCEDURE TRAY) ×2 IMPLANT
PAD ARMBOARD 7.5X6 YLW CONV (MISCELLANEOUS) ×4 IMPLANT
PATTIES SURGICAL .25X.25 (GAUZE/BANDAGES/DRESSINGS) ×2 IMPLANT
PATTIES SURGICAL .75X.75 (GAUZE/BANDAGES/DRESSINGS) IMPLANT
SPONGE SURGIFOAM ABS GEL 100 (HEMOSTASIS) ×2 IMPLANT
STRIP CLOSURE SKIN 1/2X4 (GAUZE/BANDAGES/DRESSINGS) ×2 IMPLANT
SUT ETHIBOND CT1 BRD #0 30IN (SUTURE) IMPLANT
SUT ETHIBOND NAB CT1 #1 30IN (SUTURE) ×2 IMPLANT
SUT VIC AB 0 CT1 27 (SUTURE)
SUT VIC AB 0 CT1 27XBRD ANBCTR (SUTURE) IMPLANT
SUT VIC AB 2-0 CT1 27 (SUTURE) ×2
SUT VIC AB 2-0 CT1 36 (SUTURE) ×2 IMPLANT
SUT VIC AB 2-0 CT1 TAPERPNT 27 (SUTURE) ×1 IMPLANT
SUT VIC AB 2-0 UR6 27 (SUTURE) IMPLANT
SUT VIC AB 3-0 X1 27 (SUTURE) ×2 IMPLANT
SUT VICRYL 0 UR6 27IN ABS (SUTURE) ×2 IMPLANT
SUT WIRE 16GA (Orthopedic Implant) ×2 IMPLANT
TOWEL GREEN STERILE (TOWEL DISPOSABLE) ×2 IMPLANT
TOWEL GREEN STERILE FF (TOWEL DISPOSABLE) ×2 IMPLANT
TRAY FOLEY MTR SLVR 16FR STAT (SET/KITS/TRAYS/PACK) IMPLANT
WATER STERILE IRR 1000ML POUR (IV SOLUTION) IMPLANT

## 2021-06-18 NOTE — Anesthesia Procedure Notes (Signed)
Procedure Name: Intubation Date/Time: 06/18/2021 8:04 AM Performed by: Shary Decamp, CRNA Pre-anesthesia Checklist: Patient identified, Patient being monitored, Timeout performed, Emergency Drugs available and Suction available Patient Re-evaluated:Patient Re-evaluated prior to induction Oxygen Delivery Method: Circle system utilized Preoxygenation: Pre-oxygenation with 100% oxygen Induction Type: IV induction Ventilation: Mask ventilation without difficulty and Oral airway inserted - appropriate to patient size Laryngoscope Size: Hyacinth Meeker and 2 Grade View: Grade I Tube type: Oral Tube size: 8.0 mm Number of attempts: 1 Airway Equipment and Method: Stylet Placement Confirmation: ETT inserted through vocal cords under direct vision, positive ETCO2 and breath sounds checked- equal and bilateral Secured at: 22 cm Tube secured with: Tape Dental Injury: Teeth and Oropharynx as per pre-operative assessment

## 2021-06-18 NOTE — Progress Notes (Signed)
Orthopedic Tech Progress Note Patient Details:  Victor Huang 1976/03/18 [id number]  Ortho Devices Type of Ortho Device: Soft collar Ortho Device/Splint Location: NECK Ortho Device/Splint Interventions: Ordered, Application   Post Interventions Patient Tolerated: Well Instructions Provided: Care of device  [name] 06/18/2021, 1:43 PM

## 2021-06-18 NOTE — Anesthesia Postprocedure Evaluation (Signed)
Anesthesia Post Note  Patient: Victor Huang  Procedure(s) Performed: POSTERIOR CERVICAL FUSION CERVICAL FIVE THROUGH SIX WITH TRIPLE WIRE TECHNIQUE FIXATION AND RIGHT ILIAC CREST BONE GRAFT HARVEST, LEFT CERVIACL SIX THROUGH SEVEN CERVICAL FORAMINAL LAMINOPLASTY     Patient location during evaluation: PACU Anesthesia Type: General Level of consciousness: awake and alert Pain management: pain level controlled Vital Signs Assessment: post-procedure vital signs reviewed and stable Respiratory status: spontaneous breathing, nonlabored ventilation, respiratory function stable and patient connected to nasal cannula oxygen Cardiovascular status: blood pressure returned to baseline and stable Postop Assessment: no apparent nausea or vomiting Anesthetic complications: no   No notable events documented.  Last Vitals:  Vitals:   06/18/21 1400 06/18/21 1428  BP: 122/84 126/89  Pulse: 86 85  Resp: 13 20  Temp: 36.6 C 36.4 C  SpO2: 98% 97%    Last Pain:  Vitals:   06/18/21 1532  TempSrc:   PainSc: 10-Worst pain ever                 Earl Lites P Myrla Malanowski

## 2021-06-18 NOTE — Discharge Instructions (Addendum)
    No lifting greater than 10 lbs. No overhead use of arms. Avoid bending,and twisting neck. Walk in house for first week them may start to get out slowly increasing distance up to one mile by 3 weeks post op. Keep incision dry for 3 days, may then bathe and wet incision when showering. Call if any fevers >101, chills, or increasing numbness or weakness or increased swelling or drainage.

## 2021-06-18 NOTE — H&P (Signed)
Victor Huang is an 45 y.o. male.   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  States that symptoms unchanged from previous visit.  He is wanting to proceed with POSTERIOR CERVICAL FUSION C5-6 WITH TRIPLE WIRE TECHNIQUE FIXATION AND RIGHT ILIAC CREST BONE GRAFT HARVEST, LEFT C6-7 CERVICAL FORAMINAL LAMINOPLASTY as scheduled.  Today history and physical performed.  Review of systems negative.  Patient's PCP is Mauricio Po in [place] and states that he was last seen by her January 2022.  Patient has a new complaint today of right lateral elbow pain.  States that this is been ongoing x2 weeks.  No injury.  Pain with gripping and lifting objects.  Patient is right-hand dominant.  Past Medical History:  Diagnosis Date   Asthma    as a child   GERD (gastroesophageal reflux disease)    Headache    Pneumonia     Past Surgical History:  Procedure Laterality Date   BACK SURGERY      No family history on file. Social History:  reports that he has been smoking cigarettes. He started smoking about 34 years ago. He has been smoking an average of 1.00 packs per day. He has never used smokeless tobacco. He reports previous drug use. He reports that he does not drink alcohol.  Allergies:  Allergies  Allergen Reactions   Ibuprofen Rash    No medications prior to admission.    No results found for this or any previous visit (from the past 48 hour(s)). No results found.  Review of Systems  Constitutional:  Positive for activity change.  HENT: Negative.    Respiratory: Negative.    Gastrointestinal: Negative.   Genitourinary: Negative.   Musculoskeletal:  Positive for neck pain and neck stiffness.   There were no vitals taken for this visit. Physical Exam HENT:     Head: Normocephalic and atraumatic.  Eyes:     Extraocular Movements: Extraocular  movements intact.  Cardiovascular:     Rate and Rhythm: Regular rhythm.  Pulmonary:     Effort: Pulmonary effort is normal.     Breath sounds: Normal breath sounds.  Abdominal:     General: Bowel sounds are normal. There is no distension.  Musculoskeletal:        General: Tenderness present.     Cervical back: Tenderness present.     Comments: Right elbow good range of motion.  He has mild swelling over the lateral epicondyle.  He is marked tender over the lateral epicondyle.  Lateral elbow pain increased with grip testing and resisted forearm supination and wrist extension  Neurological:     Mental Status: He is alert and oriented to person, place, and time.  Psychiatric:        Mood and Affect: Mood normal.     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  All questions answered and he wishes to proceed.    Zonia Kief, PA-C 06/18/2021, 1:18 AM

## 2021-06-18 NOTE — Interval H&P Note (Signed)
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  The various methods of treatment have been discussed with the patient and family. After consideration of risks, benefits and other options for treatment, the patient has consented to  Procedure(s): POSTERIOR CERVICAL FUSION C5-6 WITH TRIPLE WIRE TECHNIQUE FIXATION AND RIGHT ILIAC CREST BONE GRAFT HARVEST, LEFT C6-7 CERVICAL FORAMINAL LAMINOPLASTY (N/A) as a surgical intervention.  The patient's history has been reviewed, patient examined, no change in status, stable for surgery.  I have reviewed the patient's chart and labs.  Questions were answered to the patient's satisfaction.     [name]

## 2021-06-18 NOTE — Brief Op Note (Signed)
06/18/2021  12:40 PM  PATIENT:  Victor Huang  45 y.o. 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  POST-OPERATIVE DIAGNOSIS:  pseudarthrosis C5-6 anterior fusion, spondylosis with left C7   PROCEDURE:  Procedure(s): POSTERIOR CERVICAL FUSION CERVICAL FIVE THROUGH SIX WITH TRIPLE WIRE TECHNIQUE FIXATION AND RIGHT ILIAC CREST BONE GRAFT HARVEST, LEFT CERVIACL SIX THROUGH SEVEN CERVICAL FORAMINAL LAMINOPLASTY (N/A)  SURGEON:  Surgeon(s) and Role:    * Kerrin Champagne, MD - Primary  PHYSICIAN ASSISTANT: Zonia Kief, PA-C  ANESTHESIA:   local and general, Dr.Stoltzfus   EBL:  100cc  COMPLICATION: Post fixation radiographs shows fixation of the C6-7 level with interspinous process wiring, the wiring was removed and replaced at the correct level C5-6. Consequently initial foramenotomy was at the C7-T1 level, one level below the expected C6-7 level and a second foramenotomy was performed at the correct level C6-7 left side and this was done without difficulty. 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   BLOOD ADMINISTERED:none  DRAINS: Urinary Catheter (Foley)   LOCAL MEDICATIONS USED:  MARCAINE 0.5% 1:1 EXPAREL 1.3%  Amount: 30 ml  DISPOSITION OF SPECIMEN:  N/A  COUNTS:  YES  TOURNIQUET:  * No tourniquets in log *  DICTATION: .Dragon Dictation  PLAN OF CARE: Admit for overnight observation  PATIENT DISPOSITION:  PACU - hemodynamically stable.   Delay start of Pharmacological VTE agent (>24hrs) due to surgical blood loss or risk of bleeding: yes

## 2021-06-18 NOTE — Transfer of Care (Signed)
Immediate Anesthesia Transfer of Care Note  Patient: Victor Huang  Procedure(s) Performed: POSTERIOR CERVICAL FUSION CERVICAL FIVE THROUGH SIX WITH TRIPLE WIRE TECHNIQUE FIXATION AND RIGHT ILIAC CREST BONE GRAFT HARVEST, LEFT CERVIACL SIX THROUGH SEVEN CERVICAL FORAMINAL LAMINOPLASTY  Patient Location: PACU  Anesthesia Type:General  Level of Consciousness: awake, patient cooperative and responds to stimulation  Airway & Oxygen Therapy: Patient Spontanous Breathing and Patient connected to face mask oxygen  Post-op Assessment: Report given to RN, Post -op Vital signs reviewed and stable and Patient moving all extremities X 4  Post vital signs: Reviewed and stable  Last Vitals:  Vitals Value Taken Time  BP 113/72 06/18/21 1302  Temp    Pulse 99 06/18/21 1305  Resp 18 06/18/21 1305  SpO2 98 % 06/18/21 1305  Vitals shown include unvalidated device data.  Last Pain:  Vitals:   06/18/21 0630  TempSrc:   PainSc: 0-No pain         Complications: No notable events documented.

## 2021-06-18 NOTE — Progress Notes (Signed)
Spoke with Dr Otelia Sergeant, patient is complaining of numbness in the right arm. Dr. Otelia Sergeant stated to continue to monitor, states it could be due to positioning during the case.

## 2021-06-18 NOTE — Interval H&P Note (Signed)
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  The various methods of treatment have been discussed with the patient and family. After consideration of risks, benefits and other options for treatment, the patient has consented to  Procedure(s): POSTERIOR CERVICAL FUSION C5-6 WITH TRIPLE WIRE TECHNIQUE FIXATION AND RIGHT ILIAC CREST BONE GRAFT HARVEST, LEFT C6-7 CERVICAL FORAMINAL LAMINOPLASTY (N/A) as a surgical intervention.  The patient's history has been reviewed, patient examined, no change in status, stable for surgery.  I have reviewed the patient's chart and labs.  Questions were answered to the patient's satisfaction.     [name]

## 2021-06-18 NOTE — Progress Notes (Signed)
45 year old male post op Posterior interspinous process wiring C5-6 and left C6-7 and C7-T1. He is complaining of right arm numbness and pain and now some numbness into the left arm.  He is sitting up  in bed and wants to walk to the bathroom to void.  Legs are N-V normal. He has numbness mainly into the right thumb and index, C5 or C6 and also around the right elbow and left arm. Not Following a radiculotome but potentially may relate to positional nerve irritation or even from taping of the shoulders to allow for radiograph evaluation.  Motor is diffusely week right arm shoulder abduction biceps, triceps, finger extension and flexion, Global.  While in post op PACU motor was intact and normal. Will check CT Scan of the cervical spine. Will continue with gabapentin and give additional decadron doses.

## 2021-06-19 ENCOUNTER — Telehealth: Payer: Self-pay | Admitting: Specialist

## 2021-06-19 DIAGNOSIS — S14154A Other incomplete lesion at C4 level of cervical spinal cord, initial encounter: Secondary | ICD-10-CM | POA: Diagnosis not present

## 2021-06-19 DIAGNOSIS — M4722 Other spondylosis with radiculopathy, cervical region: Secondary | ICD-10-CM | POA: Diagnosis not present

## 2021-06-19 DIAGNOSIS — R200 Anesthesia of skin: Secondary | ICD-10-CM | POA: Diagnosis not present

## 2021-06-19 LAB — BASIC METABOLIC PANEL
Anion gap: 7 (ref 5–15)
BUN: 16 mg/dL (ref 6–20)
CO2: 24 mmol/L (ref 22–32)
Calcium: 9 mg/dL (ref 8.9–10.3)
Chloride: 107 mmol/L (ref 98–111)
Creatinine, Ser: 0.87 mg/dL (ref 0.61–1.24)
GFR, Estimated: >60 mL/min (ref >60)
Glucose, Bld: 183 mg/dL — ABNORMAL HIGH (ref 70–99)
Potassium: 4 mmol/L (ref 3.5–5.1)
Sodium: 138 mmol/L (ref 135–145)

## 2021-06-19 LAB — CBC
HCT: 39 % (ref 39.0–52.0)
Hemoglobin: 13.2 g/dL (ref 13.0–17.0)
MCH: 29.5 pg (ref 26.0–34.0)
MCHC: 33.8 g/dL (ref 30.0–36.0)
MCV: 87.2 fL (ref 80.0–100.0)
Platelets: 334 10*3/uL (ref 150–400)
RBC: 4.47 MIL/uL (ref 4.22–5.81)
RDW: 13.1 % (ref 11.5–15.5)
WBC: 23.1 10*3/uL — ABNORMAL HIGH (ref 4.0–10.5)
nRBC: 0 % (ref 0.0–0.2)

## 2021-06-19 MED ORDER — METHYLPREDNISOLONE 4 MG PO TBPK: ORAL_TABLET | ORAL | 0 refills | Status: DC

## 2021-06-19 MED ORDER — DOCUSATE SODIUM 100 MG PO CAPS: 100.0000 mg | ORAL_CAPSULE | Freq: Two times a day (BID) | ORAL | 0 refills | Status: DC

## 2021-06-19 MED ORDER — GABAPENTIN 300 MG PO CAPS: 300.0000 mg | ORAL_CAPSULE | Freq: Three times a day (TID) | ORAL | 1 refills | Status: DC

## 2021-06-19 MED ORDER — OXYCODONE-ACETAMINOPHEN 5-325 MG PO TABS: 1.0000 | ORAL_TABLET | ORAL | 0 refills | Status: DC | PRN

## 2021-06-19 MED ORDER — METHOCARBAMOL 500 MG PO TABS: 500.0000 mg | ORAL_TABLET | Freq: Three times a day (TID) | ORAL | Status: DC | PRN

## 2021-06-19 MED ORDER — OXYCODONE HCL 5 MG PO CAPS: 5.0000 mg | ORAL_CAPSULE | ORAL | 0 refills | Status: DC | PRN

## 2021-06-19 MED FILL — Thrombin (Recombinant) For Soln 20000 Unit: CUTANEOUS | Qty: 1 | Status: CN

## 2021-06-19 MED FILL — Thrombin (Recombinant) For Soln 20000 Unit: CUTANEOUS | Qty: 1 | Status: AC

## 2021-06-19 NOTE — Progress Notes (Signed)
     Subjective: 1 Day Post-Op Procedure(s) (LRB): POSTERIOR CERVICAL FUSION CERVICAL FIVE THROUGH SIX WITH TRIPLE WIRE TECHNIQUE FIXATION AND RIGHT ILIAC CREST BONE GRAFT HARVEST, LEFT CERVIACL SIX THROUGH SEVEN CERVICAL FORAMINAL LAMINOPLASTY (N/A)Awake, alert and oriented x 4. Voiding without difficulty, complaining of numbness right and left arm left hand into the index finger radially  and into the right hand all digits especially right index and long finger and ring . CT Scan of neck shows hardware in good position and alignment. Foraminotomies left C8 and C7 with adequate decompression.   Patient reports pain as moderate.    Objective:   VITALS:  Temp:  [97.6 F (36.4 C)-98.8 F (37.1 C)] 98.1 F (36.7 C) (06/21 0745) Pulse Rate:  [77-100] 88 (06/21 0745) Resp:  [10-20] 16 (06/21 0745) BP: (109-143)/(70-89) 118/70 (06/21 0745) SpO2:  [94 %-100 %] 95 % (06/21 0745)  Intact pulses distally Dorsiflexion/Plantar flexion intact Incision: no drainage and dressings changed post cervical spine and right posterior iliac crest. No cellulitis present Compartment soft Motor with right triceps and finger extension weakness, shoulder abduction strength is normal.    LABS Recent Labs    06/19/21 0430  HGB 13.2  WBC 23.1*  PLT 334   Recent Labs    06/19/21 0430  NA 138  K 4.0  CL 107  CO2 24  BUN 16  CREATININE 0.87  GLUCOSE 183*   No results for input(s): LABPT, INR in the last 72 hours.   Assessment/Plan: 1 Day Post-Op Procedure(s) (LRB): POSTERIOR CERVICAL FUSION CERVICAL FIVE THROUGH SIX WITH TRIPLE WIRE TECHNIQUE FIXATION AND RIGHT ILIAC CREST BONE GRAFT HARVEST, LEFT CERVIACL SIX THROUGH SEVEN CERVICAL FORAMINAL LAMINOPLASTY (N/A)  Advance diet Up with therapy Discharge home with home health  [name] 06/19/2021, 9:10 AM Patient ID: Victor Huang, male   DOB: 1976/04/13, 45 y.o.   MRN: [id number]

## 2021-06-19 NOTE — Progress Notes (Signed)
Occupational Therapy Treatment Patient Details Name: Victor Huang MRN: [id number] DOB: [birth year] Today's Date: 06/19/2021    History of present illness 45 y/o male who presents s/p C5-C6 posterior cervical fusion with R iliac crest bone graft harvest, L C6-C7 cervical foraminal laminoplasty on 06/18/2021. No significant PMH noted.   OT comments  Returned to provide HEP for BUEs in preparation for dc. Reviewing AROM exercises for bilateral hands, wrist, forearms, and elbows. Noting pt with limited strength, FM skills, and coordination. Pt demonstrated understanding. Pt with questions about washing hair; providing education on compensatory techniques for washing hair while maintaining cervical precautions. All questions answered in preparation for DC today.    Follow Up Recommendations  Follow surgeon's recommendation for DC plan and follow-up therapies    Equipment Recommendations  None recommended by OT    Recommendations for Other Services      Precautions / Restrictions Precautions Precautions: Cervical Precaution Booklet Issued: Yes (comment) Precaution Comments: Reviewed precautions and compensatory techniques Required Braces or Orthoses: Cervical Brace Cervical Brace: Soft collar Restrictions Weight Bearing Restrictions: No       Mobility Bed Mobility Overal bed mobility: Modified Independent Bed Mobility: Rolling;Sidelying to Sit           General bed mobility comments: Pt standing in room upon arrival    Transfers Overall transfer level: Modified independent Equipment used: None             General transfer comment: moving cautiously. No physical A needed    Balance Overall balance assessment: No apparent balance deficits (not formally assessed)                                         ADL either performed or assessed with clinical judgement   ADL Overall ADL's : Modified independent                                        General ADL Comments: Focused session on exericses for BUEs. Also reviewing with patient and wife on techniques for washing hair while maintianing cervical precautions.     Vision       Perception     Praxis      Cognition Arousal/Alertness: Awake/alert Behavior During Therapy: WFL for tasks assessed/performed Overall Cognitive Status: Within Functional Limits for tasks assessed                                          Exercises Exercises: Other exercises Other Exercises Other Exercises: providing HEP for AROM of BUEs. Pt demonstrating understanding. Exercises including composite flex/ext, opposition, digit abduction/adduction, digit lifts, wrist flex/ext, supination, pronation, and elbow flex/ext. x10 each. BUEs.   Shoulder Instructions       General Comments Wife and son present    Pertinent Vitals/ Pain       Pain Assessment: Faces Faces Pain Scale: Hurts little more Pain Location: neck and R hand Pain Descriptors / Indicators: Constant;Discomfort;Grimacing;Tender;Sore Pain Intervention(s): Monitored during session;Repositioned  Home Living Family/patient expects to be discharged to:: Private residence Living Arrangements: Spouse/significant other;Children Available Help at Discharge: Family;Available 24 hours/day Type of Home: House Home Access: Stairs to enter     Home Layout: One level  Bathroom Shower/Tub: Producer, television/film/video: Standard     Home Equipment: Hand held shower head          Prior Functioning/Environment Level of Independence: Independent        Comments: works - Ambulance person 2X/week        Progress Toward Goals  OT Goals(current goals can now be found in the care plan section)  Progress towards OT goals: Progressing toward goals  Acute Rehab OT Goals Patient Stated Goal: Get movement back in hand OT Goal Formulation: With patient Time For Goal Achievement:  07/03/21 Potential to Achieve Goals: Good ADL Goals Pt Will Perform Lower Body Dressing: Independently;sit to/from stand Pt Will Transfer to Toilet: Independently;ambulating;regular height toilet Pt/caregiver will Perform Home Exercise Program: Increased strength;Increased ROM;Both right and left upper extremity;With written HEP provided;Independently  Plan Discharge plan remains appropriate    Co-evaluation                 AM-PAC OT "6 Clicks" Daily Activity     Outcome Measure   Help from another person eating meals?: None Help from another person taking care of personal grooming?: None Help from another person toileting, which includes using toliet, bedpan, or urinal?: A Little Help from another person bathing (including washing, rinsing, drying)?: A Little Help from another person to put on and taking off regular upper body clothing?: None Help from another person to put on and taking off regular lower body clothing?: A Little 6 Click Score: 21    End of Session Equipment Utilized During Treatment: Cervical collar  OT Visit Diagnosis: Unsteadiness on feet (R26.81);Other abnormalities of gait and mobility (R26.89);Muscle weakness (generalized) (M62.81);Pain Pain - Right/Left: Right Pain - part of body: Hand   Activity Tolerance Patient tolerated treatment well   Patient Left with call bell/phone within reach;with family/visitor present   Nurse Communication Mobility status        Time: 9147-8295 OT Time Calculation (min): 13 min  Charges: OT General Charges $OT Visit: 1 Visit $Therapeutic Exercise: 8-22 mins  Jazier Mcglamery MSOT, OTR/L Acute Rehab Pager: [phone] Office: [phone]   Theodoro Grist Akshita Italiano 06/19/2021, 12:22 PM

## 2021-06-19 NOTE — Telephone Encounter (Signed)
Pt called stating he was originally supposed to be prescribed percocet but no pharmacies had it so instead he was supposed to have oxycodone called in. The pt states he thought he discussed having the oxycodone at 10 mg but when he picked it up it was only 5. Pt would like to have this fixed and then would like a CB when this is ready.  [phone]

## 2021-06-19 NOTE — Progress Notes (Signed)
Patient is discharged from room 3C08 at this time. Alert and in stable condition. IV site d/c'd and instructions read to patient and spouse with understanding verbalized and all questions answered. Ambulate out of unit with all belongings and spouse at side.

## 2021-06-19 NOTE — Evaluation (Signed)
Physical Therapy Evaluation Patient Details Name: HELEN CUFF MRN: [id number] DOB: October 22, 1976 Today's Date: 06/19/2021   History of Present Illness  Pty is a 45 y/o male who presents s/p C5-C6 posterior cervical fusion with R iliac crest bone graft harvest, L C6-C7 cervical foraminal laminoplasty on 06/18/2021. No significant PMH noted.  Clinical Impression  Pt admitted with above diagnosis. At the time of PT eval, pt was able to demonstrate transfers and ambulation with gross modified independence. Pt reports increased pain in RUE and hypersensitivity. PT reviewed desensitization techniques and recommended pt start with soft objects (cotton ball, soft blanket/washcloth). Pt reports he does not want to touch his hand/arm because of the hypersensitivity however noted pt constantly touching it throughout session. Pt was educated on precautions, brace application/wearing schedule, appropriate activity progression, and car transfer. Pt currently with functional limitations due to the deficits listed below (see PT Problem List). Pt will benefit from skilled PT to increase their independence and safety with mobility to allow discharge to the venue listed below.      Follow Up Recommendations No PT follow up    Equipment Recommendations  None recommended by PT    Recommendations for Other Services       Precautions / Restrictions Precautions Precautions: Cervical Precaution Booklet Issued: Yes (comment) Precaution Comments: Reviewed precautions and compensatory techniques Required Braces or Orthoses: Cervical Brace Cervical Brace: Soft collar Restrictions Weight Bearing Restrictions: No      Mobility  Bed Mobility Overal bed mobility: Modified Independent Bed Mobility: Rolling;Sidelying to Sit           General bed mobility comments: Verbally reviewed log roll technique. Pt declined practice. Reports he has been doing so all night and plans to sleep in the recliner at home.     Transfers Overall transfer level: Modified independent Equipment used: None             General transfer comment: No assist required however pt ambulating very slow and guarded.  Ambulation/Gait Ambulation/Gait assistance: Modified independent (Device/Increase time) Gait Distance (Feet): 400 Feet Assistive device: None Gait Pattern/deviations: Step-through pattern;Decreased stride length;Trunk flexed Gait velocity: Decreased Gait velocity interpretation: <1.8 ft/sec, indicate of risk for recurrent falls General Gait Details: VC's for improved posture and to keep neutral head/cervical alignment. No unsteadiness or LOB noted.  Stairs Stairs: Yes Stairs assistance: Supervision Stair Management: One rail Right;Step to pattern;Alternating pattern;Forwards Number of Stairs: 10 General stair comments: General cues for safety. No assist required.  Wheelchair Mobility    Modified Rankin (Stroke Patients Only)       Balance Overall balance assessment: No apparent balance deficits (not formally assessed)                                           Pertinent Vitals/Pain Pain Assessment: Faces Faces Pain Scale: Hurts even more Pain Location: Pt complains of pain mostly in R hand/forearm. Pain Descriptors / Indicators: Constant;Discomfort;Grimacing;Tender;Sore;Burning Pain Intervention(s): Limited activity within patient's tolerance;Monitored during session;Repositioned    Home Living Family/patient expects to be discharged to:: Private residence Living Arrangements: Spouse/significant other;Children Available Help at Discharge: Family;Available 24 hours/day Type of Home: House Home Access: Stairs to enter     Home Layout: One level Home Equipment: Hand held shower head      Prior Function Level of Independence: Independent         Comments: works - Mining engineer  cars     Hand Dominance   Dominant Hand: Right    Extremity/Trunk Assessment    Upper Extremity Assessment Upper Extremity Assessment: RUE deficits/detail;LUE deficits/detail RUE Deficits / Details: Pain at hand and forearm. Reports numbness at R deltoid. Able to perform digit composite flexion and extension, wrist ROM, and elbow ROM. Noting slight edema Decreased grasp strength RUE Sensation:  (Hypersensitive) RUE Coordination: decreased fine motor;decreased gross motor LUE Deficits / Details: Decreased grasp strength. Able to perform finger opposition, composite flexion/extension, and wrist ROM. Pain at index finger LUE Coordination: decreased fine motor;decreased gross motor    Lower Extremity Assessment Lower Extremity Assessment: Overall WFL for tasks assessed    Cervical / Trunk Assessment Cervical / Trunk Assessment: Other exceptions Cervical / Trunk Exceptions: s/p cervical surgery  Communication   Communication: No difficulties  Cognition Arousal/Alertness: Awake/alert Behavior During Therapy: WFL for tasks assessed/performed Overall Cognitive Status: Within Functional Limits for tasks assessed                                        General Comments General comments (skin integrity, edema, etc.): Son present    Exercises     Assessment/Plan    PT Assessment Patient needs continued PT services  PT Problem List Decreased strength;Decreased activity tolerance;Decreased balance;Decreased mobility;Decreased knowledge of use of DME;Decreased safety awareness;Decreased knowledge of precautions;Pain       PT Treatment Interventions DME instruction;Gait training;Stair training;Functional mobility training;Therapeutic activities;Therapeutic exercise;Neuromuscular re-education;Patient/family education    PT Goals (Current goals can be found in the Care Plan section)  Acute Rehab PT Goals Patient Stated Goal: Decrease pain in RUE PT Goal Formulation: With patient/family Time For Goal Achievement: 06/26/21 Potential to Achieve Goals:  Good    Frequency Min 5X/week   Barriers to discharge        Co-evaluation               AM-PAC PT "6 Clicks" Mobility  Outcome Measure Help needed turning from your back to your side while in a flat bed without using bedrails?: None Help needed moving from lying on your back to sitting on the side of a flat bed without using bedrails?: None Help needed moving to and from a bed to a chair (including a wheelchair)?: None Help needed standing up from a chair using your arms (e.g., wheelchair or bedside chair)?: None Help needed to walk in hospital room?: None Help needed climbing 3-5 steps with a railing? : A Little 6 Click Score: 23    End of Session Equipment Utilized During Treatment: Gait belt;Cervical collar Activity Tolerance: Patient tolerated treatment well Patient left: with family/visitor present;with nursing/sitter in room (Standing in room) Nurse Communication: Mobility status PT Visit Diagnosis: Unsteadiness on feet (R26.81);Pain Pain - Right/Left: Right Pain - part of body: Arm;Hand    Time: 0973-5329 PT Time Calculation (min) (ACUTE ONLY): 26 min   Charges:   PT Evaluation $PT Eval Low Complexity: 1 Low PT Treatments $Gait Training: 8-22 mins        Conni Slipper, PT, DPT Acute Rehabilitation Services Pager: [phone] Office: [phone]   Marylynn Pearson 06/19/2021, 11:01 AM

## 2021-06-19 NOTE — Telephone Encounter (Signed)
Pt called wanting a prescription for a hard neck brace if possible. If a neck brace can not be prescribed the pt will go get one but he wants to verify that it would be okay to get this instead of the soft brace that he currently has. The best  call back number is [phone].

## 2021-06-19 NOTE — Progress Notes (Signed)
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 Currently, pt performing ADLs and functional mobility at Mod I level. Provided education and handout on cervical precautions, collar management, UB ADLs, LB ADLs, toileting, and shower; pt demonstrated understanding. Pt presenting with decreased strength, ROM, and sensation at bilateral hands. Will return to provide HEP for BUEs in preparation for dc home today. Recommend dc to home and possible follow up with OP OT after MD follow up.     06/19/21 1000  OT Visit Information  Last OT Received On 06/19/21  Assistance Needed +1  History of Present Illness 45 y/o male who presents s/p C5-C6 posterior cervical fusion with R iliac crest bone graft harvest, L C6-C7 cervical foraminal laminoplasty on 06/18/2021. No significant PMH noted.  Precautions  Precautions Cervical  Precaution Booklet Issued Yes (comment)  Precaution Comments Reviewed precautions and compensatory techniques  Required Braces or Orthoses Cervical Brace  Cervical Brace Soft collar  Home Living  Family/patient expects to be discharged to: Private residence  Living Arrangements Spouse/significant other;Children  Available Help at Discharge Family;Available 24 hours/day  Type of Home House  Home Layout One level  Bathroom Shower/Tub Walk-in Hotel manager held shower head  Prior Function  Level of Independence Independent  Comments works - Gaffer No difficulties  Pain Assessment  Pain Assessment Faces  Faces Pain Scale 4  Pain Location neck and R hand  Pain Descriptors / Indicators Constant;Discomfort;Grimacing;Tender;Sore  Pain Intervention(s) Monitored during session;Limited activity within patient's tolerance;Repositioned  Cognition  Arousal/Alertness Awake/alert  Behavior During Therapy WFL for tasks assessed/performed   Overall Cognitive Status Within Functional Limits for tasks assessed  Upper Extremity Assessment  Upper Extremity Assessment RUE deficits/detail;LUE deficits/detail  RUE Deficits / Details Pain at hand and forearm. Reports numbness at R deltoid. Able to perform digit ccomposite flexion and extension, wrist ROM, and elbow ROM. Noting slight edema Decreased grasp strength  RUE Coordination decreased fine motor;decreased gross motor  LUE Deficits / Details Decreased grasp strength. Able to perform finger opposition, comspotie flexion/extension, and wrist ROM. Pain at index finger  LUE Coordination decreased fine motor;decreased gross motor  Lower Extremity Assessment  Lower Extremity Assessment Defer to PT evaluation  Cervical / Trunk Assessment  Cervical / Trunk Assessment Other exceptions  Cervical / Trunk Exceptions s/p cervical precdautions  ADL  Overall ADL's  Modified independent  General ADL Comments Pt performign ADLs at Mod I level. Providing education and handout on compensatory techniques for UB ADLs, LB ADLs, toileting, grooming , bed mobility, and shower transfer. Will return to continue educatio no nROM exercises for BUE.  Bed Mobility  Overal bed mobility Modified Independent  Bed Mobility Rolling;Sidelying to Sit  General bed mobility comments Educating pt on log roll. Performing with increased time  Transfers  Overall transfer level Modified independent  General transfer comment moving cautiously. No physical A needed  Balance  Overall balance assessment No apparent balance deficits (not formally assessed)  General Comments  General comments (skin integrity, edema, etc.) Son present  OT - End of Session  Equipment Utilized During Treatment Cervical collar  Activity Tolerance Patient tolerated treatment well  Patient left in bed;with call bell/phone within reach (EOB; with MD)  Nurse Communication Mobility status  OT Assessment  OT Recommendation/Assessment Patient  needs continued OT Services  OT Visit Diagnosis Unsteadiness on feet (R26.81);Other abnormalities of gait and  mobility (R26.89);Muscle weakness (generalized) (M62.81);Pain  Pain - Right/Left Right  Pain - part of body Hand  OT Problem List Decreased strength;Decreased range of motion;Decreased activity tolerance;Decreased knowledge of use of DME or AE;Decreased knowledge of precautions;Impaired UE functional use  OT Plan  OT Frequency (ACUTE ONLY) Min 2X/week  OT Treatment/Interventions (ACUTE ONLY) Self-care/ADL training;Therapeutic exercise;Energy conservation;DME and/or AE instruction;Therapeutic activities;Patient/family education  AM-PAC OT "6 Clicks" Daily Activity Outcome Measure (Version 2)  Help from another person eating meals? 4  Help from another person taking care of personal grooming? 4  Help from another person toileting, which includes using toliet, bedpan, or urinal? 3  Help from another person bathing (including washing, rinsing, drying)? 3  Help from another person to put on and taking off regular upper body clothing? 4  Help from another person to put on and taking off regular lower body clothing? 3  6 Click Score 21  OT Recommendation  Follow Up Recommendations Follow surgeon's recommendation for DC plan and follow-up therapies  OT Equipment None recommended by OT  Individuals Consulted  Consulted and Agree with Results and Recommendations Patient  Acute Rehab OT Goals  Patient Stated Goal Get movement back in hand  OT Goal Formulation With patient  Time For Goal Achievement 07/03/21  Potential to Achieve Goals Good  OT Time Calculation  OT Start Time (ACUTE ONLY) 0815  OT Stop Time (ACUTE ONLY) 0841  OT Time Calculation (min) 26 min  OT General Charges  $OT Visit 1 Visit  OT Evaluation  $OT Eval Low Complexity 1 Low  OT Treatments  $Self Care/Home Management  8-22 mins  Written Expression  Dominant Hand Right    Nakeysha Pasqual MSOT, OTR/L Acute  Rehab Pager: [phone] Office: [phone]

## 2021-06-20 NOTE — Telephone Encounter (Signed)
If he needs to take 10 mg then he can take 2 tablets, this allows him to take less when he can cut back hopefully in 2-3 days.

## 2021-06-20 NOTE — Telephone Encounter (Signed)
I called advised on message below----He is asking if the numbness/weird feeling in the right arm should be concerning,  and he states that his right hand and forearm are swollen.

## 2021-06-20 NOTE — Telephone Encounter (Signed)
I do not recommend a hard collar for this surgery, he has a plate on the front of his neck that is better than any hard collar we would put on his neck and now he has the bones on the back of his neck fixed with cable, I do not use a hard brace for foraminotomies. A hard brace will make his muscles weak and he will lose strength in lifting his head up.

## 2021-06-20 NOTE — Telephone Encounter (Signed)
Patient advised of message.

## 2021-06-21 ENCOUNTER — Encounter (HOSPITAL_COMMUNITY): Payer: Self-pay | Admitting: Specialist

## 2021-06-21 ENCOUNTER — Ambulatory Visit: Payer: Self-pay

## 2021-06-21 ENCOUNTER — Telehealth: Payer: Self-pay | Admitting: Specialist

## 2021-06-21 ENCOUNTER — Ambulatory Visit (INDEPENDENT_AMBULATORY_CARE_PROVIDER_SITE_OTHER): Payer: 59 | Admitting: Specialist

## 2021-06-21 ENCOUNTER — Other Ambulatory Visit: Payer: Self-pay

## 2021-06-21 VITALS — BP 120/80 | HR 102 | Ht 69.0 in | Wt 175.0 lb

## 2021-06-21 DIAGNOSIS — G959 Disease of spinal cord, unspecified: Secondary | ICD-10-CM

## 2021-06-21 DIAGNOSIS — R201 Hypoesthesia of skin: Secondary | ICD-10-CM

## 2021-06-21 DIAGNOSIS — R292 Abnormal reflex: Secondary | ICD-10-CM

## 2021-06-21 DIAGNOSIS — Z981 Arthrodesis status: Secondary | ICD-10-CM

## 2021-06-21 MED ORDER — OXYCODONE HCL ER 20 MG PO T12A: 20.0000 mg | EXTENDED_RELEASE_TABLET | Freq: Two times a day (BID) | ORAL | 0 refills | Status: DC

## 2021-06-21 NOTE — Telephone Encounter (Signed)
I called and spoke with [facility], I advised that per Dr. Otelia Sergeant he needs to come in and be evaluated, since this cannot be done over the phone. They will be here by 3pm today

## 2021-06-21 NOTE — Telephone Encounter (Signed)
PT wife Maxine Glenn called and she is very upset about how long it is taking for her to hear from someone. They're very concerned with the numbness her husband is feeling. He can't feel his right side, buttocks or testicle. I let her know the message had been route to Dr.Nitka and we are waiting for his response. Please give her a call.  CB [phone]

## 2021-06-21 NOTE — Patient Instructions (Signed)
Plan: Avoid overhead lifting and overhead use of the arms. Do not lift greater than 5 lbs. Adjust head rest in vehicle to prevent hyperextension if rear ended. Take extra precautions to avoid fallingAvoid overhead lifting and overhead use of the arms. Do not lift greater than 5 lbs. Adjust head rest in vehicle to prevent hyperextension if rear ended. Take extra precautions to avoid falling MRI cervical spine to assess for a post op hematoma or cord compression.

## 2021-06-21 NOTE — Progress Notes (Addendum)
Post-Op Visit Note   Patient: Victor Huang           Date of Birth: 30-Sep-1976           MRN: [id number] Visit Date: 06/21/2021 PCP: Dema Severin, NP   Assessment & Plan:POD# 2 Left C6-7, C7-T1 foramenotomy  Chief Complaint:  Chief Complaint  Patient presents with   Neck - Routine Post Op, Follow-up   Visit Diagnoses:  1. S/P cervical spinal fusion   2. Disease of spinal cord (HCC)   3. Generalized hyperreflexia   4. Hypesthesia   3 days following C5-6 Posterior wiring and fusion with right iliac crest bone graft harvest. Had post operative right arm pain and numbness and tingling. CT Scan shows the hardware in good position and alignment. He is voiding but no BM as yet now with numbnesss into the right testicle, or actually the whole area right arm right side chest wall and right torso and right groin whole area.  Motor is with right triceps weakness 4/4, right biceps is 5/5, skin with hypesthesia right arm diffusely. Feels like sand paper. Right hand grip and wrist DF and VF is normal.  Reflexes. Hoffman negative BIceps 3+/3+ Triceps 3+/3+ BR 3+/3+ Knee 3+/3+ and ankle 3+/3+ no clonus or spasticity, he is holding the right arm flexed at the elbow.   Plan: Avoid overhead lifting and overhead use of the arms. Do not lift greater than 5 lbs. Adjust head rest in vehicle to prevent hyperextension if rear ended. Take extra precautions to avoid fallingAvoid overhead lifting and overhead use of the arms. Soft collar as needed for discomfort.  Do not lift greater than 5 lbs. Adjust head rest in vehicle to prevent hyperextension if rear ended. Take extra precautions to avoid falling MRI of the cervical spine ordered to assess for a post op hematoma or swelling. May need EMG/NCV after a period of 2-3 weeks post op to assess for radiculopathy. Oxycontin 20 mg po every 12 hours for pain. Continue with OxyIR 5 mg every 4-6 hours for breakthrough pain.  Follow-Up Instructions: No  follow-ups on file.   Orders:  Orders Placed This Encounter  Procedures   XR Cervical Spine 2 or 3 views   MR CERVICAL SPINE WO CONTRAST   Meds ordered this encounter  Medications   oxyCODONE (OXYCONTIN) 20 mg 12 hr tablet    Sig: Take 1 tablet (20 mg total) by mouth every 12 (twelve) hours.    Dispense:  14 tablet    Refill:  0    Imaging: No results found.  PMFS History: Patient Active Problem List   Diagnosis Date Noted   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/2021    Priority: High    Class: Chronic   Other spondylosis with radiculopathy, cervical region 06/18/2021    Priority: High    Class: Chronic   Status post cervical spinal fusion 06/18/2021   Past Medical History:  Diagnosis Date   Asthma    as a child   GERD (gastroesophageal reflux disease)    Headache    Pneumonia     No family history on file.  Past Surgical History:  Procedure Laterality Date   BACK SURGERY     POSTERIOR CERVICAL FUSION/FORAMINOTOMY N/A 06/18/2021   Procedure: POSTERIOR CERVICAL FUSION CERVICAL FIVE THROUGH SIX WITH TRIPLE WIRE TECHNIQUE FIXATION AND RIGHT ILIAC CREST BONE GRAFT HARVEST, LEFT CERVIACL SIX THROUGH SEVEN CERVICAL FORAMINAL LAMINOPLASTY;  Surgeon: Kerrin Champagne, MD;  Location: [facility]  OR;  Service: Orthopedics;  Laterality: N/A;   Social History   Occupational History   Not on file  Tobacco Use   Smoking status: Every Day    Packs/day: 1.00    Pack years: 0.00    Types: Cigarettes    Start date: 01/01/1987   Smokeless tobacco: Never  Vaping Use   Vaping Use: Never used  Substance and Sexual Activity   Alcohol use: Never    Comment: no beer in over 15 years   Drug use: Not Currently   Sexual activity: Yes

## 2021-06-22 ENCOUNTER — Other Ambulatory Visit: Payer: Self-pay

## 2021-06-22 ENCOUNTER — Inpatient Hospital Stay (HOSPITAL_COMMUNITY)
Admission: EM | Admit: 2021-06-22 | Discharge: 2021-06-26 | DRG: 053 | Disposition: A | Discharge status: Home / Self Care | Payer: 59 | Attending: Specialist | Admitting: Specialist

## 2021-06-22 ENCOUNTER — Encounter (HOSPITAL_COMMUNITY): Payer: Self-pay | Admitting: Emergency Medicine

## 2021-06-22 ENCOUNTER — Other Ambulatory Visit: Payer: Self-pay | Admitting: Specialist

## 2021-06-22 ENCOUNTER — Emergency Department (HOSPITAL_COMMUNITY): Payer: 59

## 2021-06-22 DIAGNOSIS — M7711 Lateral epicondylitis, right elbow: Secondary | ICD-10-CM | POA: Diagnosis present

## 2021-06-22 DIAGNOSIS — K219 Gastro-esophageal reflux disease without esophagitis: Secondary | ICD-10-CM | POA: Diagnosis present

## 2021-06-22 DIAGNOSIS — Z79899 Other long term (current) drug therapy: Secondary | ICD-10-CM | POA: Diagnosis not present

## 2021-06-22 DIAGNOSIS — Z20822 Contact with and (suspected) exposure to covid-19: Secondary | ICD-10-CM | POA: Diagnosis present

## 2021-06-22 DIAGNOSIS — Z886 Allergy status to analgesic agent status: Secondary | ICD-10-CM

## 2021-06-22 DIAGNOSIS — Z981 Arthrodesis status: Secondary | ICD-10-CM | POA: Diagnosis not present

## 2021-06-22 DIAGNOSIS — S14154A Other incomplete lesion at C4 level of cervical spinal cord, initial encounter: Principal | ICD-10-CM | POA: Diagnosis present

## 2021-06-22 DIAGNOSIS — K5903 Drug induced constipation: Secondary | ICD-10-CM

## 2021-06-22 DIAGNOSIS — G959 Disease of spinal cord, unspecified: Secondary | ICD-10-CM

## 2021-06-22 DIAGNOSIS — G9520 Unspecified cord compression: Secondary | ICD-10-CM

## 2021-06-22 DIAGNOSIS — R292 Abnormal reflex: Secondary | ICD-10-CM | POA: Diagnosis present

## 2021-06-22 DIAGNOSIS — Y838 Other surgical procedures as the cause of abnormal reaction of the patient, or of later complication, without mention of misadventure at the time of the procedure: Secondary | ICD-10-CM | POA: Diagnosis present

## 2021-06-22 DIAGNOSIS — F17210 Nicotine dependence, cigarettes, uncomplicated: Secondary | ICD-10-CM | POA: Diagnosis present

## 2021-06-22 DIAGNOSIS — R200 Anesthesia of skin: Secondary | ICD-10-CM

## 2021-06-22 DIAGNOSIS — K9189 Other postprocedural complications and disorders of digestive system: Secondary | ICD-10-CM

## 2021-06-22 HISTORY — DX: Disease of spinal cord, unspecified: G95.9

## 2021-06-22 LAB — BASIC METABOLIC PANEL
Anion gap: 10 (ref 5–15)
BUN: 13 mg/dL (ref 6–20)
CO2: 26 mmol/L (ref 22–32)
Calcium: 8.6 mg/dL — ABNORMAL LOW (ref 8.9–10.3)
Chloride: 100 mmol/L (ref 98–111)
Creatinine, Ser: 0.65 mg/dL (ref 0.61–1.24)
GFR, Estimated: >60 mL/min (ref >60)
Glucose, Bld: 114 mg/dL — ABNORMAL HIGH (ref 70–99)
Potassium: 4.1 mmol/L (ref 3.5–5.1)
Sodium: 136 mmol/L (ref 135–145)

## 2021-06-22 LAB — CBC WITH DIFFERENTIAL/PLATELET
Abs Immature Granulocytes: 0.22 10*3/uL — ABNORMAL HIGH (ref 0.00–0.07)
Basophils Absolute: 0.1 10*3/uL (ref 0.0–0.1)
Basophils Relative: 0 %
Eosinophils Absolute: 0.3 10*3/uL (ref 0.0–0.5)
Eosinophils Relative: 1 %
HCT: 38.8 % — ABNORMAL LOW (ref 39.0–52.0)
Hemoglobin: 13.1 g/dL (ref 13.0–17.0)
Immature Granulocytes: 1 %
Lymphocytes Relative: 20 %
Lymphs Abs: 6 10*3/uL — ABNORMAL HIGH (ref 0.7–4.0)
MCH: 29.6 pg (ref 26.0–34.0)
MCHC: 33.8 g/dL (ref 30.0–36.0)
MCV: 87.6 fL (ref 80.0–100.0)
Monocytes Absolute: 2.1 10*3/uL — ABNORMAL HIGH (ref 0.1–1.0)
Monocytes Relative: 7 %
Neutro Abs: 21.2 10*3/uL — ABNORMAL HIGH (ref 1.7–7.7)
Neutrophils Relative %: 71 %
Platelets: 297 10*3/uL (ref 150–400)
RBC: 4.43 MIL/uL (ref 4.22–5.81)
RDW: 13.4 % (ref 11.5–15.5)
WBC: 29.9 10*3/uL — ABNORMAL HIGH (ref 4.0–10.5)
nRBC: 0 % (ref 0.0–0.2)

## 2021-06-22 LAB — SARS CORONAVIRUS 2 (TAT 6-24 HRS): SARS Coronavirus 2: NEGATIVE

## 2021-06-22 MED ORDER — POLYETHYLENE GLYCOL 3350 17 G PO PACK
17.0000 g | PACK | Freq: Every day | ORAL | Status: DC | PRN
  Administered 2021-06-24 – 2021-06-26 (×2): 17 g via ORAL
  Filled 2021-06-22 (×2): qty 1

## 2021-06-22 MED ORDER — METHOCARBAMOL 500 MG PO TABS
500.0000 mg | ORAL_TABLET | Freq: Four times a day (QID) | ORAL | Status: DC | PRN
  Administered 2021-06-23 – 2021-06-24 (×3): 500 mg via ORAL
  Filled 2021-06-22 (×3): qty 1

## 2021-06-22 MED ORDER — LORAZEPAM 2 MG/ML IJ SOLN
1.0000 mg | INTRAMUSCULAR | Status: DC | PRN
  Administered 2021-06-22: 1 mg via INTRAVENOUS
  Filled 2021-06-22: qty 1

## 2021-06-22 MED ORDER — DEXAMETHASONE SODIUM PHOSPHATE 10 MG/ML IJ SOLN
12.0000 mg | Freq: Once | INTRAMUSCULAR | Status: AC
  Administered 2021-06-22: 12 mg via INTRAVENOUS
  Filled 2021-06-22: qty 2

## 2021-06-22 MED ORDER — PANTOPRAZOLE SODIUM 40 MG PO TBEC
40.0000 mg | DELAYED_RELEASE_TABLET | Freq: Two times a day (BID) | ORAL | Status: DC
  Administered 2021-06-23 – 2021-06-26 (×7): 40 mg via ORAL
  Filled 2021-06-22 (×8): qty 1

## 2021-06-22 MED ORDER — AMITRIPTYLINE HCL 50 MG PO TABS
75.0000 mg | ORAL_TABLET | Freq: Every day | ORAL | Status: DC
  Administered 2021-06-23 – 2021-06-25 (×4): 75 mg via ORAL
  Filled 2021-06-22 (×4): qty 2

## 2021-06-22 MED ORDER — SODIUM CHLORIDE 0.9 % IV SOLN: INTRAVENOUS | Status: DC

## 2021-06-22 MED ORDER — METHOCARBAMOL 1000 MG/10ML IJ SOLN: 500.0000 mg | Freq: Four times a day (QID) | INTRAVENOUS | Status: DC | PRN

## 2021-06-22 MED ORDER — ONDANSETRON HCL 4 MG/2ML IJ SOLN: 4.0000 mg | Freq: Four times a day (QID) | INTRAMUSCULAR | Status: DC | PRN

## 2021-06-22 MED ORDER — HYDROMORPHONE HCL 1 MG/ML IJ SOLN
1.0000 mg | Freq: Once | INTRAMUSCULAR | Status: AC
  Administered 2021-06-22: 1 mg via INTRAVENOUS
  Filled 2021-06-22: qty 1

## 2021-06-22 MED ORDER — METHOCARBAMOL 500 MG PO TABS: 500.0000 mg | ORAL_TABLET | Freq: Three times a day (TID) | ORAL | Status: DC | PRN

## 2021-06-22 MED ORDER — OXYCODONE HCL 5 MG PO CAPS: ORAL_CAPSULE | ORAL | 0 refills | Status: DC

## 2021-06-22 MED ORDER — OXYCODONE HCL 5 MG PO TABS
5.0000 mg | ORAL_TABLET | ORAL | Status: DC | PRN
  Administered 2021-06-23 – 2021-06-25 (×7): 10 mg via ORAL
  Filled 2021-06-22 (×7): qty 2

## 2021-06-22 MED ORDER — DOCUSATE SODIUM 100 MG PO CAPS: 100.0000 mg | ORAL_CAPSULE | Freq: Two times a day (BID) | ORAL | Status: DC

## 2021-06-22 MED ORDER — GADOBUTROL 1 MMOL/ML IV SOLN
7.0000 mL | Freq: Once | INTRAVENOUS | Status: AC | PRN
  Administered 2021-06-22: 7 mL via INTRAVENOUS

## 2021-06-22 MED ORDER — OXYCODONE HCL ER 20 MG PO T12A
20.0000 mg | EXTENDED_RELEASE_TABLET | Freq: Two times a day (BID) | ORAL | Status: DC
  Administered 2021-06-23 – 2021-06-25 (×6): 20 mg via ORAL
  Filled 2021-06-22 (×6): qty 1

## 2021-06-22 MED ORDER — GABAPENTIN 300 MG PO CAPS
300.0000 mg | ORAL_CAPSULE | Freq: Three times a day (TID) | ORAL | Status: DC
  Administered 2021-06-23 – 2021-06-25 (×8): 300 mg via ORAL
  Filled 2021-06-22 (×8): qty 1

## 2021-06-22 MED ORDER — DOCUSATE SODIUM 100 MG PO CAPS
100.0000 mg | ORAL_CAPSULE | Freq: Two times a day (BID) | ORAL | Status: DC
  Administered 2021-06-23 – 2021-06-26 (×8): 100 mg via ORAL
  Filled 2021-06-22 (×8): qty 1

## 2021-06-22 MED ORDER — ZOLPIDEM TARTRATE 5 MG PO TABS: 5.0000 mg | ORAL_TABLET | Freq: Every evening | ORAL | Status: DC | PRN

## 2021-06-22 MED ORDER — SODIUM CHLORIDE 0.9 % IV SOLN
30.0000 mg/kg | Freq: Once | INTRAVENOUS | Status: AC
  Administered 2021-06-23: 2370 mg via INTRAVENOUS
  Filled 2021-06-22: qty 16

## 2021-06-22 MED ORDER — MAGNESIUM CITRATE PO SOLN
1.0000 | Freq: Once | ORAL | Status: AC | PRN
  Administered 2021-06-23: 1 via ORAL
  Filled 2021-06-22: qty 296

## 2021-06-22 MED ORDER — BISACODYL 5 MG PO TBEC
5.0000 mg | DELAYED_RELEASE_TABLET | Freq: Every day | ORAL | Status: DC | PRN
  Administered 2021-06-24 – 2021-06-26 (×2): 5 mg via ORAL
  Filled 2021-06-22 (×2): qty 1

## 2021-06-22 MED ORDER — ONDANSETRON HCL 4 MG PO TABS
4.0000 mg | ORAL_TABLET | Freq: Four times a day (QID) | ORAL | Status: DC | PRN
  Administered 2021-06-24: 4 mg via ORAL
  Filled 2021-06-22: qty 1

## 2021-06-22 MED ORDER — SODIUM CHLORIDE 0.9 % IV SOLN
5.4000 mg/kg/h | INTRAVENOUS | Status: AC
  Administered 2021-06-23 – 2021-06-25 (×5): 5.4 mg/kg/h via INTRAVENOUS
  Filled 2021-06-22 (×9): qty 40

## 2021-06-22 MED ORDER — HYDROMORPHONE HCL 1 MG/ML IJ SOLN: 1.0000 mg | INTRAMUSCULAR | Status: DC | PRN

## 2021-06-22 NOTE — ED Notes (Signed)
Patient transported to MRI 

## 2021-06-22 NOTE — ED Notes (Signed)
Call placed to [facility] charge RN phone, [phone] and [facility] phone, [phone], no answer.

## 2021-06-22 NOTE — ED Notes (Signed)
Pt remains in MRI at this time  

## 2021-06-22 NOTE — Telephone Encounter (Signed)
Patient's wife Maxine Glenn called advised the pharmacy need prior authorization in order to fill the Rx OxyContin. Also, the Oxycodone needed to filled as well. Monica said Dr. Otelia Sergeant wanted the Rx filled before patient was scheduled for an MRI that was to be scheduled for today. The number to contact patient is [phone].

## 2021-06-22 NOTE — Telephone Encounter (Addendum)
I called and spoke with [facility], I did advise that due to his insurance we have to get prior auth before the MRI can scheduled and that Martie Lee will be working on that today.  As for the prior auth for his medicine that has already been submitted and we are waiting on the insurance to approve/deny it.  I will send a refill request to Dr. Otelia Sergeant for the oxycodone for break through pain.  I also advised that the rx for the break through pain may not get filled right a way due to it being to soon and the insurance may not pay for it (due to run out on 06/26/21). She states that if the insurance does not cover the Oxycodone 20mg  that she would pay for it out of pocket.

## 2021-06-22 NOTE — ED Provider Notes (Signed)
[facility] EMERGENCY DEPARTMENT Provider Note   CSN: [id number] Arrival date & time: 06/22/21  1210     History Chief Complaint  Patient presents with   Numbness    Victor Huang is a 45 y.o. male.  The history is provided by the patient, the spouse and medical records. No language interpreter was used.   45 year old male brought here via EMS from home with complaints of numbness.  Patient reports he had a history of C4-5-6 fusion surgery in 2019 and recently had a surgical repair on 5 days ago to repair issue from prior surgery.  Since then he has been experiencing right arm weakness as well as sharp pain to his right axillary region.  Also endorsed numbness to the right upper chest/abdomen extending towards the right thigh buttock and testicle.  The pain to his right axillary region started today, acute onset, very intense.  He did take his oxycodone and it did provide some relief.  He does have an MRI of the cervical spine scheduled to rule out hematoma or swelling.  He denies any bowel bladder incontinence.  No fever no trouble swallowing and no chest pain or shortness of breath.  His procedure include C5-C6 posterior wiring and fusions with right iliac crest bone graft harvest.  He has had a CT scan that shows hardware in good position alignment.  He had a routine postop follow-up with orthopedist Dr. Otelia Sergeant yesterday.  Past Medical History:  Diagnosis Date   Asthma    as a child   GERD (gastroesophageal reflux disease)    Headache    Pneumonia     Patient Active Problem List   Diagnosis Date Noted   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/2021    Class: Chronic   Other spondylosis with radiculopathy, cervical region 06/18/2021    Class: Chronic   Status post cervical spinal fusion 06/18/2021    Past Surgical History:  Procedure Laterality Date   BACK SURGERY     POSTERIOR CERVICAL FUSION/FORAMINOTOMY N/A 06/18/2021   Procedure: POSTERIOR  CERVICAL FUSION CERVICAL FIVE THROUGH SIX WITH TRIPLE WIRE TECHNIQUE FIXATION AND RIGHT ILIAC CREST BONE GRAFT HARVEST, LEFT CERVIACL SIX THROUGH SEVEN CERVICAL FORAMINAL LAMINOPLASTY;  Surgeon: Kerrin Champagne, MD;  Location: [facility] OR;  Service: Orthopedics;  Laterality: N/A;       No family history on file.  Social History   Tobacco Use   Smoking status: Every Day    Packs/day: 1.00    Pack years: 0.00    Types: Cigarettes    Start date: 01/01/1987   Smokeless tobacco: Never  Vaping Use   Vaping Use: Never used  Substance Use Topics   Alcohol use: Never    Comment: no beer in over 15 years   Drug use: Not Currently    Home Medications Prior to Admission medications   Medication Sig Start Date End Date Taking? Authorizing Provider  amitriptyline (ELAVIL) 75 MG tablet Take 75 mg by mouth at bedtime. 01/10/21   [provider]  docusate sodium (COLACE) 100 MG capsule Take 1 capsule (100 mg total) by mouth 2 (two) times daily. 06/19/21   Kerrin Champagne, MD  gabapentin (NEURONTIN) 300 MG capsule Take 1 capsule (300 mg total) by mouth 3 (three) times daily. 06/19/21   Kerrin Champagne, MD  methocarbamol (ROBAXIN) 500 MG tablet Take 1 tablet (500 mg total) by mouth every 8 (eight) hours as needed for muscle spasms. 06/19/21   Kerrin Champagne,  MD  methylPREDNISolone (MEDROL DOSEPAK) 4 MG TBPK tablet 6 day dose pak take as directed 06/19/21   Kerrin Champagne, MD  oxycodone (OXY-IR) 5 MG capsule Take 1 capsule (5 mg total) by mouth every 4 (four) hours as needed for up to 7 days. 06/19/21 06/26/21  Kerrin Champagne, MD  oxyCODONE (OXYCONTIN) 20 mg 12 hr tablet Take 1 tablet (20 mg total) by mouth every 12 (twelve) hours. 06/21/21   Kerrin Champagne, MD    Allergies    Ibuprofen  Review of Systems   Review of Systems  All other systems reviewed and are negative.  Physical Exam Updated Vital Signs BP (!) 147/103 (BP Location: Left Arm)   Pulse 88   Temp 98 F (36.7 C) (Oral)   Resp (!) 21    Ht 5\' 9"  (1.753 m)   Wt 78.9 kg   SpO2 97%   BMI 25.70 kg/m   Physical Exam Vitals and nursing note reviewed.  Constitutional:      General: He is not in acute distress.    Appearance: He is well-developed.  HENT:     Head: Normocephalic and atraumatic.  Eyes:     Conjunctiva/sclera: Conjunctivae normal.  Neck:     Comments: Well-appearing surgical site to posterior neck with staples in place.  No signs of infection.  It is mildly tender to palpation Cardiovascular:     Rate and Rhythm: Normal rate and regular rhythm.     Pulses: Normal pulses.     Heart sounds: Normal heart sounds.  Pulmonary:     Effort: Pulmonary effort is normal.     Breath sounds: Normal breath sounds.  Abdominal:     Palpations: Abdomen is soft.  Musculoskeletal:     Cervical back: Neck supple.  Skin:    Findings: No rash.  Neurological:     Mental Status: He is alert.     Comments: 4 out of 5 strength to right upper extremity, 5 out of 5 strength to left upper extremity.  Decreased grip strength to right upper extremities.  Decreased sensation throughout right arm compared to left with intact radial pulse bilaterally.  Decrease sensation to right side of chest abdomen and right thigh with normal flexion and extension of the hips bilaterally.    ED Results / Procedures / Treatments   Labs (all labs ordered are listed, but only abnormal results are displayed) Labs Reviewed  BASIC METABOLIC PANEL - Abnormal; Notable for the following components:      Result Value   Glucose, Bld 114 (*)    Calcium 8.6 (*)    All other components within normal limits  CBC WITH DIFFERENTIAL/PLATELET - Abnormal; Notable for the following components:   WBC 29.9 (*)    HCT 38.8 (*)    Neutro Abs 21.2 (*)    Lymphs Abs 6.0 (*)    Monocytes Absolute 2.1 (*)    Abs Immature Granulocytes 0.22 (*)    All other components within normal limits  SARS CORONAVIRUS 2 (TAT 6-24 HRS)    EKG None  Radiology No results  found.  Procedures Procedures   Medications Ordered in ED Medications  LORazepam (ATIVAN) injection 1 mg (1 mg Intravenous Given 06/22/21 1755)  HYDROmorphone (DILAUDID) injection 1 mg (1 mg Intravenous Given 06/22/21 1358)  HYDROmorphone (DILAUDID) injection 1 mg (1 mg Intravenous Given 06/22/21 1545)    ED Course  I have reviewed the triage vital signs and the nursing notes.  Pertinent labs &  imaging results that were available during my care of the patient were reviewed by me and considered in my medical decision making (see chart for details).    MDM Rules/Calculators/A&P                          BP 131/82   Pulse 72   Temp 98 F (36.7 C) (Oral)   Resp (!) 22   Ht 5\' 9"  (1.753 m)   Wt 78.9 kg   SpO2 90%   BMI 25.70 kg/m   Final Clinical Impression(s) / ED Diagnoses Final diagnoses:  None    Rx / DC Orders ED Discharge Orders     None      1:09 PM Patient with history of prior cervical spinal fusion who has had revision surgery 5 days ago is here due to having new onset of tingling and numbness sensation to right side of body including right arm chest abdomen and thigh.  Also complaining of pain to his right axillary region that started today.  Pain is intense.  He does have an MRI of the cervical spine ordered today but is here due to worsening pain.  We will provide a pain management, check basic labs, and will obtain MRI of the cervical spine to assess for potential developing hematoma causing his paresthesia.  No obvious signs to suggest infectious etiology at this time.  Orthopedist is Dr. .  6:29 PM Pt sign out to Dr. Otelia Sergeant who will f/u on the MRI and will consult orthopedist and determine disposition.    Lockie Mola, PA-C 06/22/21 1830    06/24/21, DO 06/22/21 2141

## 2021-06-22 NOTE — ED Notes (Signed)
Attempted to call report to 5 [place] nurse. Spoke to [employer] who requested a call back so that she can assign the patient.

## 2021-06-22 NOTE — ED Triage Notes (Signed)
Pt BIB [facility] from home. Pt has a hx of C4/5/6 fusion surgery, and had surgery Monday to repair issues from prior surgery. Pt has experienced R arm weakness since surgery Monday. Pt states today he had sharp pain in R axillary area. Pt endorses numbness from R upper thigh/buttock/testicle up. Pt had scheduled MRI today but states pain was too severe.

## 2021-06-22 NOTE — ED Notes (Signed)
Pt returned from MRI °

## 2021-06-22 NOTE — ED Notes (Signed)
Patient sitting on side of bed eating supper. No distress noted. Updated on plan.

## 2021-06-22 NOTE — H&P (Addendum)
PREOPERATIVE H&P  Chief Complaint: Right arm and right hemibody dysesthesia, following cervical spine posterior fusion.  HPI: Victor Huang is a 45 y.o. male Victor Huang is an 45 y.o. male.   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 Pain with gripping and lifting objects.  Patient is right-hand dominant. Intraoperative complication of intial fixation performed at C6-7 and foramenotomy left C7-T1 due to incorrect localization intraoperatively. Fixation was then moved up the one level and the left planned C6-7 foramenotomy completed. Post operatively he had pain that was significant and complained of right arm numbness and elbow pain. Initially this was thought to be due to surgical position and right shoulder stretch due to taping of shoulder. He was standing and ambulating on POD#1 and was voiding. He was discharged home. On POD#3 he called the office complaining of increasing discomfort into the right arm and numbness and paresthesias along the right anterior torso and into the right genitalia. He was seen in the office and exam showed diffuse hyperreflexia of the arms and legs. Hoffman's sign negative, no clonus or spasticity. He is able to stand and walk without external support. Main complaints are numbness, paresthesias and hypesthesia right arm and right hemibody mainly localized to the torso and right genitalia. Bladder function is normal. No BM since surgery. Motor show right triceps weakness 4/5 remaining bilateral motor is normal.        Past Medical History:    Past Medical History:  Diagnosis Date   Asthma    as a child   GERD (gastroesophageal reflux disease)    Headache    Pneumonia     Past Surgical History:  Procedure Laterality Date   BACK SURGERY     POSTERIOR CERVICAL FUSION/FORAMINOTOMY N/A 06/18/2021   Procedure: POSTERIOR CERVICAL FUSION CERVICAL FIVE THROUGH SIX WITH TRIPLE WIRE TECHNIQUE FIXATION AND RIGHT ILIAC CREST BONE GRAFT HARVEST, LEFT CERVIACL SIX THROUGH SEVEN CERVICAL FORAMINAL LAMINOPLASTY;  Surgeon: Kerrin Champagne, MD;  Location: [facility] OR;  Service: Orthopedics;  Laterality: N/A;   Social History   Socioeconomic History   Marital status: Married    Spouse name: Not on file   Number of children: Not on file   Years of education: Not on file   Highest education level: Not on file  Occupational History   Not on file  Tobacco Use   Smoking status: Every Day    Packs/day: 1.00    Pack years: 0.00    Types: Cigarettes    Start date: 01/01/1987   Smokeless tobacco: Never  Vaping Use   Vaping Use: Never used  Substance and Sexual Activity   Alcohol use: Never    Comment: no beer in over 15 years   Drug use: Not Currently   Sexual activity: Yes  Other Topics Concern   Not on file  Social History Narrative   Not on file   Social Determinants of Health   Financial Resource Strain: Not on file  Food Insecurity: Not on file  Transportation Needs: Not on file  Physical Activity: Not on file  Stress: Not on file  Social Connections: Not on file   No family history on file. Allergies  Allergen Reactions   Ibuprofen Rash   Prior to  Admission medications   Medication Sig Start Date End Date Taking? Authorizing Provider  amitriptyline (ELAVIL) 75 MG tablet Take 75 mg by mouth at bedtime. 01/10/21  Yes [provider]  docusate sodium (COLACE) 100 MG capsule Take 1 capsule (100 mg total) by mouth 2 (two) times daily. 06/19/21  Yes Kerrin Champagne, MD  gabapentin (NEURONTIN) 300 MG capsule Take 1 capsule (300 mg total) by mouth 3 (three) times daily. 06/19/21  Yes Kerrin Champagne, MD  methocarbamol (ROBAXIN) 500 MG tablet Take 1 tablet (500  mg total) by mouth every 8 (eight) hours as needed for muscle spasms. 06/19/21  Yes Kerrin Champagne, MD  methylPREDNISolone (MEDROL DOSEPAK) 4 MG TBPK tablet 6 day dose pak take as directed Patient taking differently: Take 4 mg by mouth See admin instructions. 6 day dose pak take as directed 06/19/21  Yes Kerrin Champagne, MD  oxyCODONE-acetaminophen (PERCOCET/ROXICET) 5-325 MG tablet Take 1-2 tablets by mouth every 4 (four) hours as needed for severe pain. 06/19/21  Yes [provider]  oxycodone (OXY-IR) 5 MG capsule Take 1-2 tablets every 4-6 hours prn pain Patient taking differently: Take 5-10 mg by mouth See admin instructions. Take 1-2 tablets every 4-6 hours prn pain 06/22/21   Kerrin Champagne, MD  oxyCODONE (OXYCONTIN) 20 mg 12 hr tablet Take 1 tablet (20 mg total) by mouth every 12 (twelve) hours. 06/21/21   Kerrin Champagne, MD     Positive ROS: All other systems have been reviewed and were otherwise negative with the exception of those mentioned in the HPI and as above.  Physical Exam: General: Alert, no acute distress Cardiovascular: No pedal edema Respiratory: No cyanosis, no use of accessory musculature GI: No organomegaly, abdomen is soft and non-tender Skin: No lesions in the area of chief complaint Neurologic: Sensation intact distally Psychiatric: Patient is competent for consent with normal mood and affect Lymphatic: No axillary or cervical lymphadenopathy  MUSCULOSKELETAL: Soft C-collar, dressing posterior neck intact and dry. Motor both arms intact with 4/5 weakness right triceps. Reflexes are intact but hyper both arms and legs with spread. No clonus, no spasticity.  MRI today with cord edema at the C5-6  level with cord signal change central and right posteriorly. Hardware is  Not impinging nor is there significant hematoma. Edema is the main concern.    Assessment:Acute myelopathy following cervical spine surgery posterior cervical fusion one level with triple wire  technique. Left C6-7 and left C7-T1 foramenotomies.  Plan: Plan for admission for bedrest with HOB elevated and spinal cord injury protocol for 72 hours. I explained the risks of high dose steroids including risks of surgical site infection and avascular necrosis of hips or shoulders that can be associated with long term use of steroids. Pain control while in hospital. Will request a neurology evaluation. As the canal does not appear stenotic, decompression is not indicated. Protonix to prevent ulcer disease and PAS stockings for antiDVT prophylaxis, no oral anticoaguation to prevent hematoma at surgical incision site.    [name], MD Cell [phone] Office [phone] 06/22/2021 10:51 PM

## 2021-06-23 LAB — BASIC METABOLIC PANEL
Anion gap: 10 (ref 5–15)
BUN: 15 mg/dL (ref 6–20)
CO2: 26 mmol/L (ref 22–32)
Calcium: 8.9 mg/dL (ref 8.9–10.3)
Chloride: 98 mmol/L (ref 98–111)
Creatinine, Ser: 0.68 mg/dL (ref 0.61–1.24)
GFR, Estimated: >60 mL/min (ref >60)
Glucose, Bld: 172 mg/dL — ABNORMAL HIGH (ref 70–99)
Potassium: 3.6 mmol/L (ref 3.5–5.1)
Sodium: 134 mmol/L — ABNORMAL LOW (ref 135–145)

## 2021-06-23 LAB — CBC
HCT: 38.1 % — ABNORMAL LOW (ref 39.0–52.0)
Hemoglobin: 13 g/dL (ref 13.0–17.0)
MCH: 29.2 pg (ref 26.0–34.0)
MCHC: 34.1 g/dL (ref 30.0–36.0)
MCV: 85.6 fL (ref 80.0–100.0)
Platelets: 390 10*3/uL (ref 150–400)
RBC: 4.45 MIL/uL (ref 4.22–5.81)
RDW: 13.2 % (ref 11.5–15.5)
WBC: 21.8 10*3/uL — ABNORMAL HIGH (ref 4.0–10.5)
nRBC: 0 % (ref 0.0–0.2)

## 2021-06-23 LAB — HIV ANTIBODY (ROUTINE TESTING W REFLEX): HIV Screen 4th Generation wRfx: NONREACTIVE

## 2021-06-23 NOTE — Evaluation (Signed)
Physical Therapy Evaluation Patient Details Name: Victor Huang MRN: [id number] DOB: August 02, 1976 Today's Date: 06/23/2021   History of Present Illness  The pt is a 45 yo male presenting 6/24 due to R arm weakness, numbnes, and pain as well as R sided numbness that extends to R thigh. The pt underwent C5-C6 posterior cervical fusion with R iliac crest bone graft harvest and L C6-C7 cervical foraminal laminoplasty on 06/18/2021, was d/c home on 6/21. No significant PMH other than recent surgery.   Clinical Impression  Pt in bed upon arrival of PT, agreeable to evaluation at this time. Prior to this admission the pt was able to ambulate in his home without assist, but reports increasing difficulty due to pain in RUE. The pt now presents with minor limitations in functional mobility and dynamic stability due to above dx, and will continue to benefit from skilled PT to address these deficits. The pt was able to demo good bed mobility and sit-stand transfers without physical assist or instability, but does benefit from supervision for safety with mobility and to reiterate cues for cervical precautions with mobility. 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      Follow Up Recommendations Outpatient PT;Supervision for mobility/OOB (OPPT when cleared by MD for cervical strengthening to allow for most successful retun to work)    Equipment Recommendations  None recommended by PT    Recommendations for Other Services       Precautions / Restrictions Precautions Precautions: Cervical Precaution Comments: Reviewed precautions and compensatory techniques Required Braces or Orthoses: Cervical Brace Cervical Brace: Soft collar Restrictions Weight Bearing Restrictions: No      Mobility  Bed Mobility Overal  bed mobility: Modified Independent             General bed mobility comments: good independence with log roll without physical assist    Transfers Overall transfer level: Needs assistance Equipment used: None Transfers: Sit to/from Stand           General transfer comment: supervision for safety, no physical assist given  Ambulation/Gait Ambulation/Gait assistance: Modified independent (Device/Increase time) Gait Distance (Feet): 300 Feet Assistive device: None Gait Pattern/deviations: Step-through pattern;Decreased stride length;Trunk flexed Gait velocity: 0.66 m/s Gait velocity interpretation: 1.31 - 2.62 ft/sec, indicative of limited community ambulator General Gait Details: VC's for improved posture and to keep neutral head/cervical alignment. No unsteadiness or LOB noted.      Balance Overall balance assessment: Mild deficits observed, not formally tested                   Tandem Stance - Right Leg: 3 Tandem Stance - Left Leg: 10                     Pertinent Vitals/Pain Pain Assessment: Faces Pain Score: 5  Faces Pain Scale: Hurts little more Pain Location: pt reports generalized soreness at neck to into RUE Pain Descriptors / Indicators: Numbness;Burning Pain Intervention(s): Limited activity within patient's tolerance;Monitored during session;Repositioned    Home Living Family/patient expects to be discharged to:: Private residence Living Arrangements: Spouse/significant other;Children Available Help at Discharge: Family;Available 24 hours/day Type of Home: House Home Access: Stairs to enter     Home Layout: One level Home Equipment: Hand held shower head      Prior  Function Level of Independence: Independent         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   Dominant Hand: Right    Extremity/Trunk Assessment   Upper Extremity Assessment Upper Extremity Assessment: Defer to OT evaluation RUE Deficits /  Details: complaining of papin inhand and elbow adn armpit; decreased grip strength @ 3+/5; able to oppose thumb to all digits; reports it feels "like it's asleep" RUE Sensation: decreased light touch RUE Coordination: decreased fine motor LUE Deficits / Details: reports it is at baseline    Lower Extremity Assessment Lower Extremity Assessment: Overall WFL for tasks assessed (pt denies difference in sensation, able to demo good coordination of movements bilaterally)    Cervical / Trunk Assessment Cervical / Trunk Assessment: Other exceptions Cervical / Trunk Exceptions: s/p cervical surgery, numbness R side of trunk to hip area; not as numb as arm  Communication   Communication: No difficulties  Cognition Arousal/Alertness: Awake/alert Behavior During Therapy: WFL for tasks assessed/performed Overall Cognitive Status: Within Functional Limits for tasks assessed                                        General Comments General comments (skin integrity, edema, etc.): VSS, repeated cues for maintaining cervical precautions with mobility    Exercises Other Exercises Other Exercises: issued level 2 theraputty wtih focus on grip adn pinch strengthening Other Exercises: squeeze ball   Assessment/Plan    PT Assessment Patient needs continued PT services  PT Problem List Decreased strength;Decreased activity tolerance;Decreased balance;Decreased mobility;Decreased knowledge of use of DME;Decreased safety awareness;Decreased knowledge of precautions;Pain;Decreased coordination       PT Treatment Interventions DME instruction;Gait training;Stair training;Functional mobility training;Therapeutic activities;Therapeutic exercise;Neuromuscular re-education;Patient/family education    PT Goals (Current goals can be found in the Care Plan section)  Acute Rehab PT Goals Patient Stated Goal: Get movement back in hand PT Goal Formulation: With patient/family Time For Goal  Achievement: 07/07/21 Potential to Achieve Goals: Good    Frequency Min 5X/week    AM-PAC PT "6 Clicks" Mobility  Outcome Measure Help needed turning from your back to your side while in a flat bed without using bedrails?: None Help needed moving from lying on your back to sitting on the side of a flat bed without using bedrails?: None Help needed moving to and from a bed to a chair (including a wheelchair)?: None Help needed standing up from a chair using your arms (e.g., wheelchair or bedside chair)?: None Help needed to walk in hospital room?: A Little Help needed climbing 3-5 steps with a railing? : A Little 6 Click Score: 22    End of Session Equipment Utilized During Treatment: Gait belt;Cervical collar Activity Tolerance: Patient tolerated treatment well Patient left: in chair;with call bell/phone within reach Nurse Communication: Mobility status PT Visit Diagnosis: Unsteadiness on feet (R26.81);Pain Pain - Right/Left: Right Pain - part of body: Arm;Hand    Time: 0981-1914 PT Time Calculation (min) (ACUTE ONLY): 35 min   Charges:   PT Evaluation $PT Eval Low Complexity: 1 Low PT Treatments $Gait Training: 8-22 mins        Rolm Baptise, PT, DPT   Acute Rehabilitation Department Pager #: [phone]   Gaetana Michaelis 06/23/2021, 12:48 PM

## 2021-06-23 NOTE — Plan of Care (Signed)

## 2021-06-23 NOTE — Progress Notes (Signed)
     Subjective:   Admitted last evening with acute myelopathic signs, MRI shows edema cord C5 to C7, no hematoma, sign change in cord right C5-6 area consistent with his dysesthesia pattern, this is mainly a posterior column syndrome and likely related to passing of wire for fixation of the spinous processes. He is voiding normally and want to walk in the hallway. Receiving high dose spinal cord injury protocal decadron. He is feeling some better though there is still some numbness right C5 and C6 distribution, the right torso dysesthesia is better.   Patient reports pain as marked.    Objective:   VITALS:  Temp:  [98 F (36.7 C)-98.7 F (37.1 C)] 98.6 F (37 C) (06/25 0700) Pulse Rate:  [65-97] 86 (06/25 0700) Resp:  [13-25] 18 (06/25 0700) BP: (119-152)/(75-103) 152/91 (06/25 0700) SpO2:  [90 %-98 %] 95 % (06/25 0700) Weight:  [78.9 kg] 78.9 kg (06/24 1224)  Neurologically intact ABD soft Neurovascular intact Intact pulses distally Dorsiflexion/Plantar flexion intact Right C5 and C6 anesthesia, motor intact all levels but weak right triceps. 4/5.    LABS Recent Labs    06/22/21 1239 06/23/21 0125  HGB 13.1 13.0  WBC 29.9* 21.8*  PLT 297 390   Recent Labs    06/22/21 1239 06/23/21 0125  NA 136 134*  K 4.1 3.6  CL 100 98  CO2 26 26  BUN 13 15  CREATININE 0.65 0.68  GLUCOSE 114* 172*   No results for input(s): LABPT, INR in the last 72 hours.   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 Plan: continue with high dose steroid protocol, cover for antiulcer and anti DVT, promote walking and movement.      Advance diet Continue IV decadron per spinal cord injury protocol  [name] 06/23/2021, 8:40 AM Patient ID: Victor Huang, male   DOB: 1976/09/12, 45 y.o.   MRN: [id number]

## 2021-06-23 NOTE — Progress Notes (Signed)
Bedside shift report complete. Received patient awake,alert/orientedx4 and able to verbalize needs. NAD noted; respirations easy/even on room air. Dressing to posterior neck c/d/I; some swelling noted. Decreased sensation to RUE per patient. Movement/ sensation to extremities noted. IVF infusing per orders. Whiteboard updated. All safety measures in place and personal belongings within reach.

## 2021-06-23 NOTE — Progress Notes (Signed)
Occupational Therapy Evaluation Patient Details Name: Victor Huang MRN: [id number] DOB: [birth year] Today's Date: 06/23/2021    History of Present Illness The pt is a 45 yo male presenting 6/24 due to R arm weakness, numbnes, and pain as well as R sided numbness that extends to R thigh. The pt underwent C5-C6 posterior cervical fusion with R iliac crest bone graft harvest and L C6-C7 cervical foraminal laminoplasty on 06/18/2021, was d/c home on 6/21. No significant PMH other than recent surgery.   Clinical Impression   PTA pt recovering from recent cervical surgery. Able to complete ADL tasks close to his baseline level however pt with apparent motor sensory deficits with RUE, impacting fine motor/coordination. Also complaining of R flank numbness. 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 Will follow acutely.     Follow Up Recommendations  Outpatient OT (neuro outpt OT pending progress)    Equipment Recommendations  None recommended by OT    Recommendations for Other Services       Precautions / Restrictions Precautions Precautions: Cervical Precaution Comments: Reviewed precautions and compensatory techniques Required Braces or Orthoses: Cervical Brace Cervical Brace: Soft collar Restrictions Weight Bearing Restrictions: No      Mobility Bed Mobility Overal bed mobility: Modified Independent                  Transfers Overall transfer level: Needs assistance   Transfers: Sit to/from Stand           General transfer comment: S due to being in bed    Balance Overall balance assessment: Mild deficits observed, not formally tested                                         ADL either performed or assessed with clinical judgement   ADL Overall ADL's : Needs assistance/impaired                                     Functional mobility during ADLs: Supervision/safety General ADL  Comments: "wobbly from not walking"; able to complete ADL tasks with set up; more difficulty with fine motor tasks during ADL     Vision         Perception     Praxis      Pertinent Vitals/Pain Pain Assessment: 0-10 Pain Score: 5  Pain Location: RUE - hand, elbow and armpit feel worse Pain Descriptors / Indicators: Numbness;Burning (electricity) Pain Intervention(s): Limited activity within patient's tolerance     Hand Dominance Right   Extremity/Trunk Assessment Upper Extremity Assessment Upper Extremity Assessment: RUE deficits/detail;LUE deficits/detail RUE Deficits / Details: complaining of papin inhand and elbow adn armpit; decreased grip strength @ 3+/5; able to oppose thumb to all digits; reports it feels "like it's asleep" RUE Sensation: decreased light touch RUE Coordination: decreased fine motor LUE Deficits / Details: reports it is at baseline   Lower Extremity Assessment Lower Extremity Assessment: Defer to PT evaluation   Cervical / Trunk Assessment Cervical / Trunk Assessment: Other exceptions Cervical / Trunk Exceptions: s/p cervical surgery (numbness r side of trunk to hip area; not as numb as arm)   Communication Communication Communication: No difficulties   Cognition Arousal/Alertness: Awake/alert Behavior During Therapy: WFL for tasks assessed/performed Overall Cognitive Status: Within Functional  Limits for tasks assessed                                     General Comments       Exercises Other Exercises Other Exercises: issued level 2 theraputty wtih focus on grip adn pinch strengthening Other Exercises: squeeze ball   Shoulder Instructions      Home Living Family/patient expects to be discharged to:: Private residence Living Arrangements: Spouse/significant other;Children Available Help at Discharge: Family;Available 24 hours/day Type of Home: House Home Access: Stairs to enter     Home Layout: One level      Bathroom Shower/Tub: Producer, television/film/video: Standard Bathroom Accessibility: Yes How Accessible: Accessible via walker Home Equipment: Hand held shower head          Prior Functioning/Environment Level of Independence: Independent        Comments: works - Estate agent Problem List: Decreased strength;Decreased coordination;Decreased knowledge of use of DME or AE;Impaired sensation;Impaired UE functional use;Pain      OT Treatment/Interventions: Self-care/ADL training;Therapeutic exercise;Neuromuscular education;Patient/family education    OT Goals(Current goals can be found in the care plan section) Acute Rehab OT Goals Patient Stated Goal: Get movement back in hand OT Goal Formulation: With patient Time For Goal Achievement: 07/07/21 Potential to Achieve Goals: Good  OT Frequency: Min 2X/week   Barriers to D/C:            Co-evaluation              AM-PAC OT "6 Clicks" Daily Activity     Outcome Measure Help from another person eating meals?: None Help from another person taking care of personal grooming?: A Little Help from another person toileting, which includes using toliet, bedpan, or urinal?: A Little Help from another person bathing (including washing, rinsing, drying)?: A Little Help from another person to put on and taking off regular upper body clothing?: A Little Help from another person to put on and taking off regular lower body clothing?: A Little 6 Click Score: 19   End of Session Nurse Communication: Mobility status  Activity Tolerance: Patient tolerated treatment well Patient left: in bed;with call bell/phone within reach  OT Visit Diagnosis: Muscle weakness (generalized) (M62.81);Pain Pain - Right/Left: Right Pain - part of body: Arm;Hand                Time: 0086-7619 OT Time Calculation (min): 21 min Charges:  OT General Charges $OT Visit: 1 Visit OT Evaluation $OT Eval Moderate Complexity: 1 Mod  Gerry Heaphy, OT/L   Acute OT Clinical Specialist Acute Rehabilitation Services Pager [phone] Office [phone]   [facility] 06/23/2021, 10:28 AM

## 2021-06-24 NOTE — Plan of Care (Signed)

## 2021-06-24 NOTE — Progress Notes (Signed)
Patient ID: Victor Huang, male   DOB: 1976/09/14, 45 y.o.   MRN: [id number] Patient is up walking in his room he states the pain in the right upper extremity is about 70% better.  Continue with high-dose steroid protocol.

## 2021-06-25 ENCOUNTER — Inpatient Hospital Stay (HOSPITAL_COMMUNITY): Payer: 59

## 2021-06-25 MED ORDER — FLEET ENEMA 7-19 GM/118ML RE ENEM
1.0000 | ENEMA | Freq: Once | RECTAL | Status: AC
  Administered 2021-06-25: 1 via RECTAL
  Filled 2021-06-25: qty 1

## 2021-06-25 MED ORDER — MAGIC MOUTHWASH
5.0000 mL | Freq: Four times a day (QID) | ORAL | Status: DC
  Administered 2021-06-25 – 2021-06-26 (×5): 5 mL via ORAL
  Filled 2021-06-25 (×8): qty 5

## 2021-06-25 MED ORDER — FLEET ENEMA 7-19 GM/118ML RE ENEM
1.0000 | ENEMA | Freq: Every day | RECTAL | Status: DC | PRN
  Administered 2021-06-25: 1 via RECTAL
  Filled 2021-06-25 (×2): qty 1

## 2021-06-25 MED ORDER — GABAPENTIN 400 MG PO CAPS
400.0000 mg | ORAL_CAPSULE | Freq: Three times a day (TID) | ORAL | Status: DC
  Administered 2021-06-25 – 2021-06-26 (×3): 400 mg via ORAL
  Filled 2021-06-25 (×3): qty 1

## 2021-06-25 MED ORDER — OXYCODONE HCL ER 10 MG PO T12A
10.0000 mg | EXTENDED_RELEASE_TABLET | Freq: Two times a day (BID) | ORAL | Status: DC
  Administered 2021-06-25 – 2021-06-26 (×2): 10 mg via ORAL
  Filled 2021-06-25 (×2): qty 1

## 2021-06-25 NOTE — Plan of Care (Signed)
Called by patient's primary attending Dr. Otelia Sergeant - regarding right upper extremity pain and numbness along with right hemibody pain.  Status post C5-6 posterior cervical fusion with right iliac crest bone graft and left C6-7 foraminal laminoplasty on 06/18/2021 with discharged home following right hemibody pain bring him back. Repeat MRI C-spine with postoperative changes related to recent posterior fusion at C5-6 and left-sided foraminotomy at C6-7 and C7-T1 with improved left foraminal patency.  There is abnormal cord signal involving the central and right hemicord at the levels of C 4 5 and C5-6 suggesting edema and concerning for spinal cord injury with a question of small focus of associated hemorrhage at the level of C5.  2.3 x 2.5 x 2.0 cm collection along the posterior midline incision felt to be most consistent with benign postoperative seroma. High-dose steroid with good relief of pain. Would recommend continuing current management with steroids and outpatient neurology follow-up with outpatient EMG/nerve conduction studies in about 4 to 6 weeks.  -- Milon Dikes, MD Neurologist [facility] Pager: [phone]

## 2021-06-25 NOTE — Progress Notes (Signed)
Pt has white film coating tongue and complaining that everything tastes like acid. Also requesting enema. Dr. Otelia Sergeant called and VM left.

## 2021-06-25 NOTE — Progress Notes (Signed)
Physical Therapy Treatment Patient Details Name: Victor Huang MRN: [id number] DOB: 15-Mar-1976 Today's Date: 06/25/2021    History of Present Illness The pt is a 45 yo male presenting 6/24 due to R arm weakness, numbnes, and pain as well as R sided numbness that extends to R thigh. The pt underwent C5-C6 posterior cervical fusion with R iliac crest bone graft harvest and L C6-C7 cervical foraminal laminoplasty on 06/18/2021, was d/c home on 6/21. No significant PMH other than recent surgery.    PT Comments    Pt progressing well towards all goals. Pt continues to report numbness and tingling t/o R UE however demo's full ROM and is able to use functionally. Freq dec to 3x/wk as pt is progressing well and ambulating without assist t/o hallways. Will focus on higher level balance moving forward.   Follow Up Recommendations  Outpatient PT;Supervision for mobility/OOB     Equipment Recommendations  None recommended by PT    Recommendations for Other Services       Precautions / Restrictions Precautions Precautions: Cervical Precaution Booklet Issued: Yes (comment) Precaution Comments: Reviewed precautions and compensatory techniques Required Braces or Orthoses: Cervical Brace Cervical Brace: Soft collar Restrictions Weight Bearing Restrictions: No    Mobility  Bed Mobility Overal bed mobility: Modified Independent Bed Mobility: Rolling;Sidelying to Sit           General bed mobility comments: bed flat, no physical assist required, mild increase in time    Transfers Overall transfer level: Modified independent Equipment used: None Transfers: Sit to/from Stand Sit to Stand: Modified independent (Device/Increase time)         General transfer comment: no difficulty  Ambulation/Gait Ambulation/Gait assistance: Modified independent (Device/Increase time) Gait Distance (Feet): 600 Feet Assistive device: None Gait Pattern/deviations: WFL(Within Functional Limits) Gait  velocity: wfl Gait velocity interpretation: >2.62 ft/sec, indicative of community ambulatory General Gait Details: no episode of LOB, no vearing L/R, pt with good upright posture   Stairs Stairs: Yes Stairs assistance: Supervision Stair Management: One rail Right;Alternating pattern Number of Stairs: 4 General stair comments: limited by IV line, no difficulty   Wheelchair Mobility    Modified Rankin (Stroke Patients Only)       Balance Overall balance assessment: Mild deficits observed, not formally tested                                          Cognition Arousal/Alertness: Awake/alert Behavior During Therapy: WFL for tasks assessed/performed Overall Cognitive Status: Within Functional Limits for tasks assessed                                 General Comments: pt does require v/c's to adhere to cervical precautions functionally. Pt constantly reaching up above head with R UE and looking down at phone      Exercises      General Comments General comments (skin integrity, edema, etc.): VSS, incision healing      Pertinent Vitals/Pain Pain Assessment: 0-10 Pain Score: 5  Pain Location: pt with numbness and tingling t/o R UE from just below the deltoid down to fingers, pt reports R LE numbness as resolved Pain Descriptors / Indicators: Numbness;Burning Pain Intervention(s): Monitored during session    Home Living  Prior Function            PT Goals (current goals can now be found in the care plan section) Acute Rehab PT Goals Patient Stated Goal: stop the numbness Progress towards PT goals: Progressing toward goals    Frequency    Min 3X/week      PT Plan Frequency needs to be updated    Co-evaluation              AM-PAC PT "6 Clicks" Mobility   Outcome Measure  Help needed turning from your back to your side while in a flat bed without using bedrails?: None Help needed moving  from lying on your back to sitting on the side of a flat bed without using bedrails?: None Help needed moving to and from a bed to a chair (including a wheelchair)?: None Help needed standing up from a chair using your arms (e.g., wheelchair or bedside chair)?: None Help needed to walk in hospital room?: None Help needed climbing 3-5 steps with a railing? : A Little 6 Click Score: 23    End of Session Equipment Utilized During Treatment: Cervical collar Activity Tolerance: Patient tolerated treatment well Patient left: in chair;with call bell/phone within reach Nurse Communication: Mobility status PT Visit Diagnosis: Unsteadiness on feet (R26.81);Pain Pain - Right/Left: Right Pain - part of body: Arm;Hand     Time: 0922-0939 PT Time Calculation (min) (ACUTE ONLY): 17 min  Charges:  $Gait Training: 8-22 mins                     Victor Huang, PT, DPT Acute Rehabilitation Services Pager #: [phone] Office #: [phone]    Victor Huang 06/25/2021, 9:53 AM

## 2021-06-25 NOTE — Progress Notes (Signed)
     Subjective:   DAY#3 OF IV steroids for acute spinal cord injury. Awake alert and oriented x 4. Complains of numbness right hemibody, mainly torso and right arm with areas of spotty hypesthesia right dorsal distal forearm and right elbow. Has Right lateral elbow tenderness in the area of the lateral epicondyle as preop. Numbness in a C7 distribution.   Patient reports pain as moderate.    Objective:   VITALS:  Temp:  [97.9 F (36.6 C)-98.6 F (37 C)] 97.9 F (36.6 C) (06/27 0749) Pulse Rate:  [78-97] 86 (06/27 0749) Resp:  [18-19] 19 (06/27 0749) BP: (136-157)/(79-82) 136/81 (06/27 0749) SpO2:  [95 %-96 %] 96 % (06/27 0749)  Neurologically intact ABD soft Neurovascular intact Sensation intact distally Intact pulses distally Dorsiflexion/Plantar flexion intact Incision: dressing C/D/I and no drainage No cellulitis present Compartment soft Dressing changed, no incisional warmth or erythrema or drainage, showered yesterday and will today and change dressing post shower. Voiding but no BM, fleets ordered for today.    LABS Recent Labs    06/22/21 1239 06/23/21 0125  HGB 13.1 13.0  WBC 29.9* 21.8*  PLT 297 390   Recent Labs    06/22/21 1239 06/23/21 0125  NA 136 134*  K 4.1 3.6  CL 100 98  CO2 26 26  BUN 13 15  CREATININE 0.65 0.68  GLUCOSE 114* 172*   No results for input(s): LABPT, INR in the last 72 hours.   Assessment/Plan:   Day of IV steriods for spinal cord injury, posterior cord  Started Sat at 0000hrs midnight Friday-Saturday should complete at midnight tonight a 3 day course then stop. Will keep until afternoon tomorrow to check for any post discontinuing rebound swelling.  Check lab tomorrow including CBC, BMET, sed rate and CRP for baseline and surveillance for any signs of post op infection.  Complains of sore tongue with exudate. Started on magic mouthwash with nystatin. Increase gabapentin to 400 TID.   Advance diet Up with therapy Plan  for discharge tomorrow  [name] 06/25/2021, 12:18 PM Patient ID: Victor Huang, male   DOB: [birth year], 45 y.o.   MRN: [id number]

## 2021-06-26 ENCOUNTER — Other Ambulatory Visit: Payer: Self-pay | Admitting: Specialist

## 2021-06-26 LAB — BASIC METABOLIC PANEL
Anion gap: 7 (ref 5–15)
BUN: 19 mg/dL (ref 6–20)
CO2: 31 mmol/L (ref 22–32)
Calcium: 8.7 mg/dL — ABNORMAL LOW (ref 8.9–10.3)
Chloride: 102 mmol/L (ref 98–111)
Creatinine, Ser: 0.79 mg/dL (ref 0.61–1.24)
GFR, Estimated: >60 mL/min (ref >60)
Glucose, Bld: 133 mg/dL — ABNORMAL HIGH (ref 70–99)
Potassium: 4.2 mmol/L (ref 3.5–5.1)
Sodium: 140 mmol/L (ref 135–145)

## 2021-06-26 LAB — CBC WITH DIFFERENTIAL/PLATELET
Abs Immature Granulocytes: 1.25 10*3/uL — ABNORMAL HIGH (ref 0.00–0.07)
Basophils Absolute: 0.1 10*3/uL (ref 0.0–0.1)
Basophils Relative: 0 %
Eosinophils Absolute: 0 10*3/uL (ref 0.0–0.5)
Eosinophils Relative: 0 %
HCT: 34 % — ABNORMAL LOW (ref 39.0–52.0)
Hemoglobin: 11.6 g/dL — ABNORMAL LOW (ref 13.0–17.0)
Immature Granulocytes: 5 %
Lymphocytes Relative: 12 %
Lymphs Abs: 2.9 10*3/uL (ref 0.7–4.0)
MCH: 29.9 pg (ref 26.0–34.0)
MCHC: 34.1 g/dL (ref 30.0–36.0)
MCV: 87.6 fL (ref 80.0–100.0)
Monocytes Absolute: 2.3 10*3/uL — ABNORMAL HIGH (ref 0.1–1.0)
Monocytes Relative: 9 %
Neutro Abs: 18.6 10*3/uL — ABNORMAL HIGH (ref 1.7–7.7)
Neutrophils Relative %: 74 %
Platelets: 365 10*3/uL (ref 150–400)
RBC: 3.88 MIL/uL — ABNORMAL LOW (ref 4.22–5.81)
RDW: 13.7 % (ref 11.5–15.5)
WBC: 25 10*3/uL — ABNORMAL HIGH (ref 4.0–10.5)
nRBC: 0 % (ref 0.0–0.2)

## 2021-06-26 LAB — SEDIMENTATION RATE: Sed Rate: 35 mm/hr — ABNORMAL HIGH (ref 0–16)

## 2021-06-26 LAB — C-REACTIVE PROTEIN: CRP: 1.9 mg/dL — ABNORMAL HIGH (ref <1.0)

## 2021-06-26 MED ORDER — GABAPENTIN 400 MG PO CAPS: 400.0000 mg | ORAL_CAPSULE | Freq: Three times a day (TID) | ORAL | 3 refills | Status: DC

## 2021-06-26 MED ORDER — DICLOFENAC SODIUM 1 % EX GEL: 2.0000 g | Freq: Four times a day (QID) | CUTANEOUS | 0 refills | Status: DC

## 2021-06-26 MED ORDER — MAGIC MOUTHWASH
5.0000 mL | Freq: Four times a day (QID) | ORAL | 0 refills | Status: DC | PRN
Start: 2021-06-26 — End: 2021-10-04

## 2021-06-26 MED ORDER — FLEET ENEMA 7-19 GM/118ML RE ENEM
1.0000 | ENEMA | Freq: Every day | RECTAL | 0 refills | Status: DC | PRN
Start: 2021-06-26 — End: 2021-10-04

## 2021-06-26 MED ORDER — OXYCODONE HCL 5 MG PO CAPS: ORAL_CAPSULE | ORAL | 0 refills | Status: DC

## 2021-06-26 MED ORDER — DICLOFENAC SODIUM 1 % EX GEL
2.0000 g | Freq: Four times a day (QID) | CUTANEOUS | Status: DC
  Administered 2021-06-26: 2 g via TOPICAL
  Filled 2021-06-26: qty 100

## 2021-06-26 MED ORDER — OXYCODONE HCL ER 20 MG PO T12A: 20.0000 mg | EXTENDED_RELEASE_TABLET | Freq: Two times a day (BID) | ORAL | 0 refills | Status: DC

## 2021-06-26 MED ORDER — AMITRIPTYLINE HCL 75 MG PO TABS: 75.0000 mg | ORAL_TABLET | Freq: Every day | ORAL | 3 refills | Status: AC

## 2021-06-26 MED ORDER — DOCUSATE SODIUM 100 MG PO CAPS: ORAL_CAPSULE | ORAL | 0 refills | Status: DC

## 2021-06-26 NOTE — Progress Notes (Addendum)
NCM shared PT's evaluation/ recommendations: Outpatient PT;Supervision for mobility/OOB. Pt stated agreeable to outpatient PT /OT services. Pt stated he didn't want to go to [facility]. which is closer to his residence, preference, [facility].in [place].  NCM made referral : [facility] [phone],  fax [phone], [address]  [address], [place] [address]...noted on AVS. Pt from home with supportive wife and kid. States will have no problems with transportation to appointments. Gae Gallop RN,BSN,CM

## 2021-06-26 NOTE — Progress Notes (Signed)
Occupational Therapy Treatment Patient Details Name: Victor Huang MRN: [id number] DOB: 06-20-1976 Today's Date: 06/26/2021    History of present illness The pt is a 45 yo male presenting 6/24 due to R arm weakness, numbnes, and pain as well as R sided numbness that extends to R thigh. The pt underwent C5-C6 posterior cervical fusion with R iliac crest bone graft harvest and L C6-C7 cervical foraminal laminoplasty on 06/18/2021, was d/c home on 6/21. No significant PMH other than recent surgery.   OT comments  Pt states his RUE "feels about the same". Educated pt/wife on grip/pinch strengthening using theraputty/squeeze ball. Written fine-motor /coordination HEP provided/reviewed. Pt complaining of B upper trap pina/discomfort. Educated pt on importance of improving his posture to help relieve muscular pain. Recommend alternating ice/heat on upper traps however pt unable to tolerate ice. Given tubing to help hold utensils due to impaired sensation. Reviewed compensatory strategies for ADL tasks. Pt will need to progress with outpt OT to help facilitate return to work when medically able.   Follow Up Recommendations  Outpatient OT    Equipment Recommendations  None recommended by OT    Recommendations for Other Services      Precautions / Restrictions Precautions Precautions: Cervical Precaution Booklet Issued: Yes (comment) Precaution Comments: Reviewed precautions and compensatory techniques Required Braces or Orthoses: Cervical Brace Cervical Brace: Soft collar       Mobility Bed Mobility Overal bed mobility: Modified Independent                  Transfers                      Balance                                           ADL either performed or assessed with clinical judgement   ADL                                         General ADL Comments: reviewed compensatory strategies for ADL tasks. Pt donned shrit with vc  for technique to decrease pain during dressing. Pt verbalized understanding     Vision       Perception     Praxis      Cognition Arousal/Alertness: Awake/alert Behavior During Therapy: WFL for tasks assessed/performed Overall Cognitive Status: Within Functional Limits for tasks assessed                                          Exercises Other Exercises Other Exercises: level 3 theraputty and written HEP Other Exercises: fine motor/coordination   Shoulder Instructions       General Comments Pt with upper trap tightness. Recommend alternate heat /ice to help with pain management. Pt unable to tolerate ice    Pertinent Vitals/ Pain       Pain Assessment: Faces Faces Pain Scale: Hurts even more Pain Location: neck/upper trap with RUE movement at times Pain Descriptors / Indicators: Numbness;Burning;Cramping Pain Intervention(s): Limited activity within patient's tolerance  Home Living  Prior Functioning/Environment              Frequency  Min 2X/week        Progress Toward Goals  OT Goals(current goals can now be found in the care plan section)  Progress towards OT goals: Progressing toward goals  Acute Rehab OT Goals Patient Stated Goal: stop the numbness OT Goal Formulation: With patient Time For Goal Achievement: 07/07/21 Potential to Achieve Goals: Good ADL Goals Pt Will Perform Lower Body Dressing: Independently;sit to/from stand Pt Will Transfer to Toilet: Independently;ambulating Pt/caregiver will Perform Home Exercise Program: Increased strength;Right Upper extremity;With theraputty;With written HEP provided Additional ADL Goal #1: Pt will verbalize 3 strategies to maintain cervical precautions during IADL tasks  Plan Discharge plan remains appropriate    Co-evaluation                 AM-PAC OT "6 Clicks" Daily Activity     Outcome Measure   Help from  another person eating meals?: None Help from another person taking care of personal grooming?: None Help from another person toileting, which includes using toliet, bedpan, or urinal?: None Help from another person bathing (including washing, rinsing, drying)?: None Help from another person to put on and taking off regular upper body clothing?: None Help from another person to put on and taking off regular lower body clothing?: None 6 Click Score: 24    End of Session    OT Visit Diagnosis: Muscle weakness (generalized) (M62.81);Pain Pain - Right/Left: Right Pain - part of body: Arm;Hand   Activity Tolerance Patient tolerated treatment well   Patient Left in chair;with call bell/phone within reach;with family/visitor present   Nurse Communication Other (comment) (need for outpt OT)        Time: 7341-9379 OT Time Calculation (min): 32 min  Charges: OT General Charges $OT Visit: 1 Visit OT Treatments $Self Care/Home Management : 8-22 mins $Neuromuscular Re-education: 8-22 mins  Luisa Dago, OT/L   Acute OT Clinical Specialist Acute Rehabilitation Services Pager [phone] Office [phone]    [facility] 06/26/2021, 3:26 PM

## 2021-06-26 NOTE — Progress Notes (Signed)
     Subjective:   Awake, alert and oriented x 4. Felt anxious with the steroid IV. Felt like was short of breaths. Voiding fine but still not moving bowels well. Ok to have for enemas today to improve his moving his bowels.   Patient reports pain as moderate.    Objective:   VITALS:  Temp:  [97.9 F (36.6 C)-98.6 F (37 C)] 98.3 F (36.8 C) (06/27 2000) Pulse Rate:  [78-89] 78 (06/27 2000) Resp:  [17-19] 18 (06/27 2000) BP: (131-145)/(72-84) 131/72 (06/27 2000) SpO2:  [95 %-100 %] 95 % (06/27 2000)  Neurologically intact ABD soft Neurovascular intact Sensation intact distally Intact pulses distally Dorsiflexion/Plantar flexion intact Incision: dressing C/D/I and no drainage No cellulitis present   LABS Recent Labs    06/26/21 0408  HGB 11.6*  WBC 25.0*  PLT 365   Recent Labs    06/26/21 0408  NA 140  K 4.2  CL 102  CO2 31  BUN 19  CREATININE 0.79  GLUCOSE 133*   No results for input(s): LABPT, INR in the last 72 hours.   Assessment/Plan:   Acute myelopathy post cervical fusion. Right lateral epicondylitis  Advance diet Up with therapy Voltaren gel for right elbow. I will return after long surgical case today this afternoon and discharge him.   [name] 06/26/2021, 7:46 AM Patient ID: Victor Huang, male   DOB: 01/26/1976, 45 y.o.   MRN: [id number]

## 2021-06-26 NOTE — Progress Notes (Signed)
Pt given discharge instructions and gone over with him. He verbalized understanding. All belongings gathered by pt to be taken home. Pt in no distress at discharge.

## 2021-06-26 NOTE — Plan of Care (Signed)
?  Problem: Health Behavior/Discharge Planning: ?Goal: Ability to manage health-related needs will improve ?Outcome: Progressing ?  ?Problem: Elimination: ?Goal: Will not experience complications related to bowel motility ?Outcome: Progressing ?  ?Problem: Pain Managment: ?Goal: General experience of comfort will improve ?Outcome: Progressing ?  ?

## 2021-06-27 ENCOUNTER — Other Ambulatory Visit: Payer: Self-pay | Admitting: Specialist

## 2021-06-27 ENCOUNTER — Encounter: Payer: 59 | Admitting: Surgery

## 2021-06-29 ENCOUNTER — Telehealth: Payer: Self-pay

## 2021-06-29 ENCOUNTER — Other Ambulatory Visit: Payer: Self-pay | Admitting: Specialist

## 2021-06-29 MED ORDER — LINACLOTIDE 72 MCG PO CAPS: 72.0000 ug | ORAL_CAPSULE | Freq: Every day | ORAL | 0 refills | Status: DC

## 2021-06-29 NOTE — Telephone Encounter (Signed)
Patient's wife Victor Huang called stating that patient has only had 2 bowel movements since being home on Tuesday.  Has had 2 enemas, taken Colace, and Miralax and nothing is really helping, but the enema.  Stated that when he does have a bowel movement, it's not a lot.  Would like a call back ASAP?  Cb# [phone].  Please advise.  Thank you

## 2021-06-29 NOTE — Telephone Encounter (Signed)
Please advise 

## 2021-07-02 ENCOUNTER — Other Ambulatory Visit: Payer: Self-pay | Admitting: Specialist

## 2021-07-04 ENCOUNTER — Ambulatory Visit (INDEPENDENT_AMBULATORY_CARE_PROVIDER_SITE_OTHER): Payer: 59 | Admitting: Specialist

## 2021-07-04 ENCOUNTER — Other Ambulatory Visit: Payer: Self-pay

## 2021-07-04 ENCOUNTER — Telehealth: Payer: Self-pay | Admitting: Radiology

## 2021-07-04 ENCOUNTER — Encounter: Payer: Self-pay | Admitting: Specialist

## 2021-07-04 ENCOUNTER — Telehealth: Payer: Self-pay | Admitting: Specialist

## 2021-07-04 ENCOUNTER — Ambulatory Visit (INDEPENDENT_AMBULATORY_CARE_PROVIDER_SITE_OTHER): Payer: 59

## 2021-07-04 VITALS — BP 124/85 | HR 92 | Ht 69.0 in | Wt 175.0 lb

## 2021-07-04 DIAGNOSIS — Z981 Arthrodesis status: Secondary | ICD-10-CM | POA: Diagnosis not present

## 2021-07-04 DIAGNOSIS — S14155A Other incomplete lesion at C5 level of cervical spinal cord, initial encounter: Secondary | ICD-10-CM

## 2021-07-04 DIAGNOSIS — M4712 Other spondylosis with myelopathy, cervical region: Secondary | ICD-10-CM

## 2021-07-04 DIAGNOSIS — R201 Hypoesthesia of skin: Secondary | ICD-10-CM

## 2021-07-04 MED ORDER — BACLOFEN 10 MG PO TABS: 10.0000 mg | ORAL_TABLET | Freq: Three times a day (TID) | ORAL | 0 refills | Status: DC

## 2021-07-04 MED ORDER — OXYCODONE HCL 5 MG PO CAPS: ORAL_CAPSULE | ORAL | 0 refills | Status: DC

## 2021-07-04 NOTE — Telephone Encounter (Signed)
Dr. Otelia Sergeant sent in Baclofen and Oxycodone to his pharmacy

## 2021-07-04 NOTE — Telephone Encounter (Signed)
Received call from Mia, pharmacist, with [facility] in [place]. She states that they have been trying to reach out to [place] in regards to Gabapentin 400mg  that was sent in on 06/28/2021.  Quantity listed was #3 and she feels that this is a mistake. Please advise prior to them filling rx.  CB for Mia [phone]

## 2021-07-04 NOTE — Telephone Encounter (Signed)
err

## 2021-07-04 NOTE — Progress Notes (Signed)
   Post-Op Visit Note   Patient: Victor Huang           Date of Birth: November 21, 1976           MRN: [id number] Visit Date: 07/04/2021 PCP: Dema Severin, NP   Assessment & Plan: 2 weeks post op C5-6 posterior fusion with complication of spinal cord   Chief Complaint:  Chief Complaint  Patient presents with   Neck - Routine Post Op  Having spasm in the neck and numbness persisting over the right hemibody . Episode of click in neck with intense pain into the right arm. Exam: Incision is healed. Motor is intact with weakness right biceps and right triceps.  Reflexes Hyperreflexia both arms and both legs. Hoffman's sign positive on the left, no clonus Radiographs with hardware present Anterior plate C4 to C6 and posterior interspinous process wiring and graft wiring in good Position and alignment.   Visit Diagnoses:  1. S/P cervical spinal fusion     Plan: Avoid overhead lifting and overhead use of the arms. Do not lift greater than 5-10lbs. Adjust head rest in vehicle to prevent hyperextension if rear ended. Take extra precautions to avoid falling, Referral to OT at neurorehabilitation Referral to neurology for assistance with eval of severity of posterior cord injury.    Orders:  Orders Placed This Encounter  Procedures   XR Cervical Spine 2 or 3 views   No orders of the defined types were placed in this encounter.   Imaging: No results found.  PMFS History: Patient Active Problem List   Diagnosis Date Noted   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/2021    Priority: High    Class: Chronic   Other spondylosis with radiculopathy, cervical region 06/18/2021    Priority: High    Class: Chronic   Acute myelopathy (HCC) 06/22/2021   Status post cervical spinal fusion 06/18/2021   Past Medical History:  Diagnosis Date   Asthma    as a child   GERD (gastroesophageal reflux disease)    Headache    Pneumonia     No family history on file.  Past  Surgical History:  Procedure Laterality Date   BACK SURGERY     POSTERIOR CERVICAL FUSION/FORAMINOTOMY N/A 06/18/2021   Procedure: POSTERIOR CERVICAL FUSION CERVICAL FIVE THROUGH SIX WITH TRIPLE WIRE TECHNIQUE FIXATION AND RIGHT ILIAC CREST BONE GRAFT HARVEST, LEFT CERVIACL SIX THROUGH SEVEN CERVICAL FORAMINAL LAMINOPLASTY;  Surgeon: Kerrin Champagne, MD;  Location: [facility] OR;  Service: Orthopedics;  Laterality: N/A;   Social History   Occupational History   Not on file  Tobacco Use   Smoking status: Every Day    Packs/day: 1.00    Pack years: 0.00    Types: Cigarettes    Start date: 01/01/1987   Smokeless tobacco: Never  Vaping Use   Vaping Use: Never used  Substance and Sexual Activity   Alcohol use: Never    Comment: no beer in over 15 years   Drug use: Not Currently   Sexual activity: Yes

## 2021-07-05 ENCOUNTER — Other Ambulatory Visit: Payer: Self-pay | Admitting: Specialist

## 2021-07-06 ENCOUNTER — Ambulatory Visit: Payer: 59 | Attending: Specialist | Admitting: Occupational Therapy

## 2021-07-06 ENCOUNTER — Telehealth: Payer: Self-pay | Admitting: Specialist

## 2021-07-06 ENCOUNTER — Other Ambulatory Visit: Payer: Self-pay | Admitting: Specialist

## 2021-07-06 ENCOUNTER — Other Ambulatory Visit: Payer: Self-pay

## 2021-07-06 DIAGNOSIS — R208 Other disturbances of skin sensation: Secondary | ICD-10-CM

## 2021-07-06 DIAGNOSIS — M79601 Pain in right arm: Secondary | ICD-10-CM | POA: Diagnosis present

## 2021-07-06 DIAGNOSIS — R202 Paresthesia of skin: Secondary | ICD-10-CM

## 2021-07-06 DIAGNOSIS — R252 Cramp and spasm: Secondary | ICD-10-CM

## 2021-07-06 DIAGNOSIS — M542 Cervicalgia: Secondary | ICD-10-CM

## 2021-07-06 DIAGNOSIS — M6281 Muscle weakness (generalized): Secondary | ICD-10-CM | POA: Diagnosis present

## 2021-07-06 MED ORDER — GABAPENTIN 400 MG PO CAPS: 400.0000 mg | ORAL_CAPSULE | Freq: Three times a day (TID) | ORAL | 3 refills | Status: DC

## 2021-07-06 NOTE — Telephone Encounter (Signed)
Dr. Otelia Sergeant sent in a new rx

## 2021-07-06 NOTE — Telephone Encounter (Signed)
Victor Huang with [facility] would like to know do they need to disregard the gabapentin rx? She states there is some confusion about the rx and they have been trying to get clarification all week. Victor Huang would like a CB  [phone]

## 2021-07-07 ENCOUNTER — Ambulatory Visit (HOSPITAL_COMMUNITY): Payer: 59

## 2021-07-07 ENCOUNTER — Other Ambulatory Visit: Payer: Self-pay | Admitting: Specialist

## 2021-07-07 MED ORDER — METHYLPREDNISOLONE 4 MG PO TBPK: ORAL_TABLET | ORAL | 0 refills | Status: DC

## 2021-07-07 MED ORDER — TIZANIDINE HCL 4 MG PO TABS: 4.0000 mg | ORAL_TABLET | Freq: Four times a day (QID) | ORAL | 0 refills | Status: DC | PRN

## 2021-07-07 NOTE — Therapy (Signed)
[facility] [address] [place], [place], [address] Phone: [phone]   Fax:  [phone]  Occupational Therapy Evaluation  Patient Details  Name: Victor Huang MRN: [id number] Date of Birth: 1976-09-12 No data recorded  Encounter Date: 07/06/2021   OT End of Session - 07/06/21 1034     Visit Number 1    Number of Visits 9    Date for OT Re-Evaluation 10/04/21    Authorization Type [employer]    Authorization - Visit Number 1    Authorization - Number of Visits 30   Combined PT/OT   OT Start Time 0844    OT Stop Time 0934    OT Time Calculation (min) 50 min    Activity Tolerance Patient limited by pain;Other (comment)   see 'Clinical Impression'   Behavior During Therapy WFL for tasks assessed/performed             Past Medical History:  Diagnosis Date   Asthma    as a child   GERD (gastroesophageal reflux disease)    Headache    Pneumonia     Past Surgical History:  Procedure Laterality Date   BACK SURGERY     POSTERIOR CERVICAL FUSION/FORAMINOTOMY N/A 06/18/2021   Procedure: POSTERIOR CERVICAL FUSION CERVICAL FIVE THROUGH SIX WITH TRIPLE WIRE TECHNIQUE FIXATION AND RIGHT ILIAC CREST BONE GRAFT HARVEST, LEFT CERVIACL SIX THROUGH SEVEN CERVICAL FORAMINAL LAMINOPLASTY;  Surgeon: Kerrin Champagne, MD;  Location: [facility] OR;  Service: Orthopedics;  Laterality: N/A;    There were no vitals filed for this visit.   Subjective Assessment - 07/06/21 0847     Subjective  Pt arrives to session w/ primary complaint of dysesthesia and paresthesia of his RUE, R-sided torso, and proximal region of RLE which started approximately 3 days after posterior cervical fusion. Pt reports he felt a "click" in his neck at that time and then experienced "an explosion of pain in his chest and R arm; "during the surgery, they bumped or scratched my spinal cord." Pt also states he feels like he has been overly cautious since the surgery.     Pertinent History R hemibody paresthesias and dyesthesia w/ RUE weakness s/p posterior cervical fusion (C5-6) w/ triple wire fixation and L C6-7 cervical foraminal laminoplasty on 06/18/21. Previously received Anterior Cervical Discectomy and Fusion (ACDF) in 2019    Patient Stated Goals "I'm here because of the doctor;" decrease stiffness    Currently in Pain? 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 s/p posterior cervical spinal fusion    Referring Provider (OT) [name], MD    Onset Date/Surgical Date 06/18/21    Hand Dominance Right    Next MD Visit 08/09/21      Precautions  Precautions Cervical;Fall    Precaution Comments Avoid overhead lifting and overhead use of the arms; do not lift greater than 5lbs; take extra precautions to avoid falling    Required Braces or Orthoses Cervical Brace    Cervical Brace Soft collar;For comfort      Balance Screen   Has the patient fallen in the past 6 months No      Home  Environment   Type of Home House    Bathroom Shower/Tub Tub/Shower unit    Shower/tub characteristics Curtain    Home Equipment None    Additional Comments OT reviewed potential benefit of long-handled sponge; pt reports having purchased a grab bar, but have not installed it    Lives With Spouse      Prior Function   Level of Independence Independent    Vocation Full time employment    Vocation Requirements Painting cars (heavy lifting, often  requires uncommon/uncomfortable positioning)   Only income for household     ADL   Eating/Feeding Needs assist with cutting food    Grooming Brushing hair   Difficulty/limited due to pain   Upper Body Bathing Independent    Lower Body Bathing Minimal assistance    Upper Body Dressing Minimal assistance    Lower Body Dressing Independent    LobbyistToilet Transfer Independent    Toileting -  Transport plannerHygiene Independent    Tub/Shower Transfer Equipment Shower seat without back      IADL   Meal Prep Able to complete simple warm meal prep;Able to complete simple cold meal and snack prep    Community Mobility Relies on family or friends for transportation   1.5 more months of no driving, per pt     Cognition   Overall Cognitive Status Within Functional Limits for tasks assessed      Sensation   Light Touch Appears Intact    Stereognosis Appears Intact    Hot/Cold Appears Intact   per pt report, is hypersensitive   Proprioception Appears Intact      Coordination   9 Hole Peg Test Right;Left    Right 9 Hole Peg Test 38 sec    Left 9 Hole Peg Test 30 sec      ROM / Strength   AROM / PROM / Strength AROM;Strength      AROM   Overall AROM  Unable to assess;Due to pain;Due to precautions      Strength   Overall Strength Unable to assess;Due to pain;Due to precautions              OT Education - 07/06/21 0909     Education Details Education provided on role and purpose of OT, as well as potential interventions and goals for therapy    Person(s) Educated Patient    Methods Explanation    Comprehension Verbalized understanding              OT Short Term Goals - 07/06/21 0904       OT SHORT TERM GOAL #1   Title Pt will demonstrate BUE shoulder flexion to approximately 90 degrees w/out pain to facilitate functional forward reach    Baseline Decreased BUE AROM due to pain    Time 4    Period Weeks    Status New    Target Date 08/03/21      OT SHORT TERM GOAL #2   Title Pt will  safely demonstrate understanding of adaptive strategies for completing LB bathing w/ Mod Ind, including AE prn  Baseline Min A for LB bathing    Time 4    Period Weeks    Status New      OT SHORT TERM GOAL #3   Title Pt will improve coordination and functional use of R, dominant hand as evidenced by decreasing 9-HPT time by at least 6 seconds    Baseline R hand (38 sec); L hand (30 sec)    Time 4    Period Weeks    Status New              OT Long Term Goals - 07/06/21 0905       OT LONG TERM GOAL #1   Title Pt will be able to complete grooming tasks (i.e. hair care) w/ Mod Ind and no increased pain at least 75% of the time   as medical precautions allow   Baseline Mod A    Time 8    Period Weeks    Status New    Target Date 08/31/21      OT LONG TERM GOAL #2   Title Pt will be able to cut most foods w/out add'l pain at least 75% of the time using compensatory strategies/AE prn   as medical precautions allow   Baseline Needs assist for cutting food    Time 8    Period Weeks    Status New      OT LONG TERM GOAL #3   Title Pt will be independent w/ HEP designed for coordination and functional use of RUE by discharge    Baseline No HEP at this time    Time 8    Period Weeks    Status New      OT LONG TERM GOAL #4   Title Pt will improve coordination and functional use of UEs as evidenced by decreasing 9-HPT of BUEs by at least 10 sec    Baseline R hand (38 sec); L hand (30 sec)    Time 8    Period Weeks    Status New              Plan - 07/06/21 0930     Clinical Impression Statement Pt is a 45 y.o. male who presents to OP OT due to R hemibody paresthesias and dysesthesias, as well as RUE weakness and dysfunction s/p posterior fusion surgery on 06/18/21 completed to resolve L-sided neck and shoulder pain that progressively worsened starting in Sept 2021; per physician referral, "posterior cord injury during posterior fusion surgery." Relevant PMHx includes  anterior cervical discectomy and fusion (ACDF) in 2019. Toward conclusion of evaluation, pt also experienced episode of lightheadedness and dizziness, which he reports occurs frequently when sitting and/or when moving his arms. Due to this, OT will discuss medical precautions w/ physician to ensure safety of initiating therapy at this time. When confirmation of medical clearance is received, pt will benefit from skilled occupational therapy services to address strength and coordination, ROM, pain management, altered sensation, GM/FM control, compensatory strategies including use of AE prn, and implementation of an HEP to improve participation and safety during ADLs and IADLs.    OT Occupational Profile and History Problem Focused Assessment - Including review of records relating to presenting problem    Occupational performance deficits (Please refer to evaluation for details): ADL's;IADL's;Rest and Sleep;Work;Leisure;Social Participation    Body Structure / Function / Physical Skills ADL;Flexibility;ROM;UE functional use;Balance;Decreased knowledge of use of DME;FMC;Mobility;Body mechanics;Dexterity;Gait;Muscle spasms;Sensation;Edema;Strength;Pain;Endurance;IADL    Psychosocial Skills Environmental  Adaptations  Rehab Potential Good    Clinical Decision Making Several treatment options, min-mod task modification necessary    Comorbidities Affecting Occupational Performance: May have comorbidities impacting occupational performance    Modification or Assistance to Complete Evaluation  No modification of tasks or assist necessary to complete eval    OT Frequency 1x / week    OT Duration 8 weeks    OT Treatment/Interventions Self-care/ADL training;Therapeutic exercise;Aquatic Therapy;Neuromuscular education;Patient/family education;Building services engineer;Therapeutic activities;Balance training;Ultrasound;DME and/or AE instruction;Manual Therapy;Electrical Stimulation;Moist Heat;Cryotherapy    Plan  Contact MD regarding precautions (ROM?); review goals w/ pt; initiate coordination activities    Consulted and Agree with Plan of Care Patient             Patient will benefit from skilled therapeutic intervention in order to improve the following deficits and impairments:   Body Structure / Function / Physical Skills: ADL, Flexibility, ROM, UE functional use, Balance, Decreased knowledge of use of DME, FMC, Mobility, Body mechanics, Dexterity, Gait, Muscle spasms, Sensation, Edema, Strength, Pain, Endurance, IADL   Psychosocial Skills: Environmental  Adaptations   Visit Diagnosis: Cervicalgia  Pain in right arm  Paresthesia of skin  Other disturbances of skin sensation  Cramp and spasm  Muscle weakness (generalized)    Problem List Patient Active Problem List   Diagnosis Date Noted   Acute myelopathy (HCC) 06/22/2021   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/2021    Class: Chronic   Other spondylosis with radiculopathy, cervical region 06/18/2021    Class: Chronic   Status post cervical spinal fusion 06/18/2021     Rosie Fate, OTR/L, MSOT  07/06/2021, 9:30 AM  [facility] [address] [place], [place], [address] Phone: [phone]   Fax:  [phone]  Name: ROLLIE HYNEK MRN: [id number] Date of Birth: [birth year]

## 2021-07-10 NOTE — Discharge Summary (Signed)
Patient ID: Victor Huang MRN: [id number] DOB/AGE: [birth year] 45 y.o.  Admit date: 06/22/2021 Discharge date: 06/26/2021  Admission Diagnoses:  Active Problems:   Acute myelopathy [facility]   Discharge Diagnoses:  Active Problems:   Acute myelopathy (HCC)  status post   Past Medical History:  Diagnosis Date   Asthma    as a child   GERD (gastroesophageal reflux disease)    Headache    Pneumonia     Surgeries:  on * No surgery found *   Consultants:   Discharged Condition: Improved  Hospital Course: Victor Huang is an 45 y.o. male who was admitted 06/22/2021 for operative treatment of acute myelopathy. Patient failed conservative treatments (please see the history and physical for the specifics) and had severe unremitting pain that affects sleep, daily activities and work/hobbies. After pre-op clearance, the patient was taken to the operating room on * No surgery found * and underwent  .    Patient was given perioperative antibiotics:  Anti-infectives (From admission, onward)    None        Patient was given sequential compression devices and early ambulation to prevent DVT.   Patient benefited maximally from hospital stay and there were no complications. At the time of discharge, the patient was urinating/moving their bowels without difficulty, tolerating a regular diet, pain is controlled with oral pain medications and they have been cleared by PT/OT.   Recent vital signs: No data found.   Recent laboratory studies: No results for input(s): WBC, HGB, HCT, PLT, NA, K, CL, CO2, BUN, CREATININE, GLUCOSE, INR, CALCIUM in the last 72 hours.  Invalid input(s): PT, 2   Discharge Medications:   Allergies as of 06/26/2021       Reactions   Ibuprofen Rash        Medication List     STOP taking these medications    gabapentin 300 MG capsule Commonly known as: NEURONTIN   methylPREDNISolone 4 MG Tbpk tablet Commonly known as: MEDROL DOSEPAK   oxyCODONE 20  mg 12 hr tablet Commonly known as: OxyCONTIN   oxycodone 5 MG capsule Commonly known as: OXY-IR       TAKE these medications    amitriptyline 75 MG tablet Commonly known as: ELAVIL Take 1 tablet (75 mg total) by mouth at bedtime.   diclofenac Sodium 1 % Gel Commonly known as: VOLTAREN Apply 2 g topically 4 (four) times daily.   docusate sodium 100 MG capsule Commonly known as: COLACE Take one po four times daily, then decrease to one po bid when bowels become more regular. What changed:  how much to take how to take this when to take this additional instructions   magic mouthwash Soln Take 5 mLs by mouth 4 (four) times daily as needed for mouth pain (swish and swallow).   oxyCODONE-acetaminophen 5-325 MG tablet Commonly known as: PERCOCET/ROXICET Take 1-2 tablets by mouth every 4 (four) hours as needed for severe pain.   sodium phosphate 7-19 GM/118ML Enem Place 133 mLs (1 enema total) rectally daily as needed for severe constipation.       ASK your doctor about these medications    oxyCODONE 20 mg 12 hr tablet Commonly known as: OxyCONTIN Take 1 tablet (20 mg total) by mouth every 12 (twelve) hours for 7 days. Ask about: Should I take this medication?        Diagnostic Studies: DG Cervical Spine 2 or 3 views  Result Date: 06/18/2021 CLINICAL DATA:  Cervical fusion. EXAM:  CERVICAL SPINE - 2-3 VIEW COMPARISON:  MR and prior studies FINDINGS: Intraoperative LATERAL views of the cervical spine are submitted postoperatively for interpretation. Anterior fusion from C4-C6 again noted. On film 1, a posterior localizer is directed at the C4-5 interspace. On film 2, a posterior localizer is directed at the C5 spinous process. IMPRESSION: Localizers as described. Electronically Signed   By: Harmon PierJeffrey  Hu M.D.   On: 06/18/2021 16:34   DG Abd 1 View  Result Date: 06/25/2021 CLINICAL DATA:  45 year old male with postoperative pneumobilia EXAM: ABDOMEN - 1 VIEW COMPARISON:   None FINDINGS: There is large amount of stool throughout the colon. No bowel dilatation or evidence of obstruction. No free air or radiopaque calculi. The osseous structures are intact. The soft tissues are unremarkable. IMPRESSION: Constipation. No bowel obstruction. Electronically Signed   By: Elgie CollardArash  Radparvar M.D.   On: 06/25/2021 20:21   CT CERVICAL SPINE WO CONTRAST  Result Date: 06/18/2021 CLINICAL DATA:  Initial evaluation for cervical radiculopathy, bilateral arm numbness and weakness, prior cervical fusion. EXAM: CT CERVICAL SPINE WITHOUT CONTRAST TECHNIQUE: Multidetector CT imaging of the cervical spine was performed without intravenous contrast. Multiplanar CT image reconstructions were also generated. COMPARISON:  Comparison made with previous CT from 02/14/2021 as well as prior MRI from 03/17/2021. FINDINGS: Alignment: Straightening of the normal cervical lordosis. No listhesis or interval malalignment. Skull base and vertebrae: Visualized skull base intact. Normal C1-2 articulations are preserved. Vertebral body height maintained without acute or interval fracture. No discrete or worrisome osseous lesions. Patient status post ACDF at C4-5 and C5-6. 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 Postoperative changes from recent posterior fusion with cerclage wires about the C5 and C6 spinous processes. Additionally, postoperative changes from recent left-sided foraminotomy at C6-7 and C7-T1. Soft tissues and spinal canal: Few scattered foci of postoperative gas present within the epidural space. No discrete epidural collection or other complication. Additional postoperative stranding and emphysema seen along the midline of the posterior neck. No discrete paraspinous collection. Overlying skin staples in place. Prevertebral soft tissues demonstrate no acute finding. Disc levels: C2-3: Small right paracentral disc protrusion  minimally indents the right ventral thecal sac (series 5, image 43). No significant spinal stenosis. Foramina appear patent. C3-4: Mild disc bulge, eccentric to the right. Mild flattening of the ventral thecal sac with no more than mild spinal stenosis. Mild bilateral uncovertebral hypertrophy with mild left C4 foraminal stenosis. Right neural foramina remains patent. C4-5: Prior fusion.  No residual canal or foraminal stenosis. C5-6: Prior fusion with pseudoarthrosis. Residual endplate and uncovertebral spurring. Posterior endplate spurring mildly indents the ventral thecal sac on the right (series 5, image 73). No significant residual spinal stenosis. Sequelae of interval posterior fusion with cerclage wires in place. Residual mild to moderate left C6 foraminal narrowing. Stable right foraminal patency. C6-7: Postoperative changes from interval left foraminotomy. Few scattered foci of postoperative gas. No spinal stenosis. Left neural foramen appears widely patent. Right-sided uncovertebral spurring with no more than mild right C7 foraminal stenosis. C7-T1: Sequelae of interval left foraminotomy. Few scattered foci of postoperative gas. No spinal stenosis. Left-sided uncovertebral and facet hypertrophy with no more than mild left C8 foraminal stenosis. Right neural foramina remains patent. Upper chest: Visualized upper chest demonstrates no acute finding. Paraseptal emphysema. Other: None. IMPRESSION: 1. Postoperative changes from recent left-sided foraminotomy at C6-7 and C7-T1 without complication. Improved left-sided foraminal patency at these levels. 2. 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 No other hardware complication. 3. Additional mild multilevel spondylosis as above, otherwise grossly stable from recent MRI from 03/17/2021. Electronically Signed   By: Rise Mu M.D.   On: 06/18/2021 22:34   MR CERVICAL SPINE W WO  CONTRAST  Result Date: 06/22/2021 CLINICAL DATA:  Initial evaluation for acute neck pain, right upper extremity weakness, history of recent surgery on 06/18/2021. EXAM: MRI CERVICAL SPINE WITHOUT AND WITH CONTRAST TECHNIQUE: Multiplanar and multiecho pulse sequences of the cervical spine, to include the craniocervical junction and cervicothoracic junction, were obtained without and with intravenous contrast. CONTRAST:  7mL GADAVIST GADOBUTROL 1 MMOL/ML IV SOLN COMPARISON:  Prior CT from 06/18/2021 as well as previous MRI from 03/17/2021. FINDINGS: Alignment: Straightening of the normal cervical lordosis. No listhesis or interval subluxation. Vertebrae: Susceptibility artifact from prior ACDF at C4-5 and C5-6. No pseudoarthrosis at C5-6 better appreciated on prior CT. Additionally, there are postoperative changes from recent posterior fusion at C5-6 as well. Additionally, patient has undergone recent left-sided foraminotomy at C6-7 and C7-T1. Vertebral body height maintained with no acute, subacute, or chronic fracture. Underlying bone marrow signal intensity within normal limits. No evidence for osteomyelitis discitis or septic arthritis. Cord: Abnormal cord signal intensity seen involving the central and right hemicord at the levels of C4-5 and C5-6, suggesting edema, and concerning for possible spinal cord injury (series 8, images 16, 20). Possible small focus of associated hemorrhage at the level of C5 (series 9, image 18). No epidural hematoma or other collection. Mild diffuse dural enhancement about the dorsal epidural space extending from C4 through C7-T1 felt to be within normal limits for normal expected postoperative changes. Otherwise, signal intensity within the cervical spinal cord is within normal limits. Posterior Fossa, vertebral arteries, paraspinal tissues: Visualized brain and posterior fossa within normal limits. Craniocervical junction normal. Postoperative changes related to recent posterior  fusion seen at C5-6. Small residual collection along the posterior midline incision at about the surgical site measures approximately 2.3 x 2.5 x 2.0 cm, felt to be most consistent with a benign postoperative seroma (series 8, image 34). No convincing evidence for CSF leak. Normal expected surrounding postoperative edema and enhancement within the adjacent posterior paraspinous soft tissues. Prevertebral soft tissues are normal. Normal flow voids seen within the vertebral arteries bilaterally. Disc levels: C2-C3: Unremarkable. C3-C4: Mild disc bulge with uncovertebral spurring. Superimposed shallow right paracentral disc protrusion contacts and minimally flattens the right ventral cord. No cord signal changes. Stable mild spinal stenosis. Mild bilateral C4 foraminal narrowing also relatively unchanged. C4-C5:  Prior fusion.  No residual canal or foraminal stenosis. C5-C6: Prior fusion. No residual spinal stenosis. Residual uncovertebral hypertrophy with mild left C6 foraminal narrowing, stable. Stable right foraminal patency. C6-C7: Disc bulge with uncovertebral spurring. Flattening and partial effacement of the ventral thecal sac with no more than mild spinal stenosis. Interval left foraminotomy with no significant residual foraminal stenosis. Mild right C7 foraminal narrowing is relatively stable. C7-T1: Minimal annular disc bulge. Postoperative changes from interval left foraminotomy with improved left foraminal patency. Thecal sac remains patent. No significant right foraminal narrowing. Visualized upper thoracic spine demonstrates no significant finding. IMPRESSION: 1. Postoperative changes related to recent posterior fusion at C5-6, with left-sided foraminotomy at C6-7 and C7-T1. Improved left foraminal patency at these levels. 2. Abnormal cord signal intensity involving the central and right hemicord at the levels of C4-5 and C5-6, suggesting edema, and concerning for spinal cord injury. Question small  focus  of associated hemorrhage at the level of C5. 3. 2.3 x 2.5 x 2.0 cm collection along the posterior midline incision, felt to be most consistent with a benign postoperative seroma. 4. Otherwise stable mild multilevel cervical spondylosis as above. Electronically Signed   By: Rise Mu M.D.   On: 06/22/2021 19:37   XR Cervical Spine 2 or 3 views  Result Date: 07/04/2021 Radiographs with hardware present Anterior plate C4 to C6 and posterior interspinous process wiring and graft wiring in good Position and alignment.    Discharge Instructions     Ambulatory referral to Occupational Therapy   Complete by: As directed    Ambulatory referral to Physical Therapy   Complete by: As directed    Call MD / Call 911   Complete by: As directed    If you experience chest pain or shortness of breath, CALL 911 and be transported to the hospital emergency room.  If you develope a fever above 101 F, pus (white drainage) or increased drainage or redness at the wound, or calf pain, call your surgeon's office.   Care order/instruction   Complete by: As directed    Give remaining voltaren gel and fleets to use at home.   Constipation Prevention   Complete by: As directed    Drink plenty of fluids.  Prune juice may be helpful.  You may use a stool softener, such as Colace (over the counter) 100 mg twice a day.  Use MiraLax (over the counter) for constipation as needed.   Diet - low sodium heart healthy   Complete by: As directed    Discharge instructions   Complete by: As directed    No lifting greater than 10 lbs. Avoid bending, stooping and twisting. Walking in house for first week then may start to get out slowly increasing activity using arms. May shower as needed and change dressing following shower. Avoid overhead use of arms and overhead lifting. Wear collar for comfort. Use ice as needed for comfort.  Voltaren gel apply I inch squeezed from tube then rub into the right elbow 3-4 times per  day Take colace 4 times a day till bowels are more regular the change to bid. Drink 4-5 large glasses of water a day to keep bowels moving. Narcotics are a cause of constipation so try to come off them as tolerated.  Elevation of the head slowly returning to lying flat over a period of 1-2 weeks. Return to the office on Wed July 04, 2021 in the AM, I will ask Neysa Bonito to call with an appointment.   Driving restrictions   Complete by: As directed    No driving for 6 weeks   Increase activity slowly as tolerated   Complete by: As directed    Lifting restrictions   Complete by: As directed    No lifting for 6 weeks   Post-operative opioid taper instructions:   Complete by: As directed    POST-OPERATIVE OPIOID TAPER INSTRUCTIONS: It is important to wean off of your opioid medication as soon as possible. If you do not need pain medication after your surgery it is ok to stop day one. Opioids include: Codeine, Hydrocodone(Norco, Vicodin), Oxycodone(Percocet, oxycontin) and hydromorphone amongst others.  Long term and even short term use of opiods can cause: Increased pain response Dependence Constipation Depression Respiratory depression And more.  Withdrawal symptoms can include Flu like symptoms Nausea, vomiting And more Techniques to manage these symptoms Hydrate well Eat regular healthy meals Stay  active Use relaxation techniques(deep breathing, meditating, yoga) Do Not substitute Alcohol to help with tapering If you have been on opioids for less than two weeks and do not have pain than it is ok to stop all together.  Plan to wean off of opioids This plan should start within one week post op of your joint replacement. Maintain the same interval or time between taking each dose and first decrease the dose.  Cut the total daily intake of opioids by one tablet each day Next start to increase the time between doses. The last dose that should be eliminated is the evening dose.            Follow-up Information     [facility] Follow up.   Specialty: Rehabilitation Why: Referral made for outpatient PT. Please call office to arrange appointment time. Contact information: [address]  [address] [id number] [facility] [place] [address] [phone]                Discharge Plan:  discharge to home  Disposition:     Signed: Zonia Kief  07/10/2021, 4:25 PM

## 2021-07-10 NOTE — Discharge Summary (Signed)
Patient ID: Victor Huang MRN: [id number] DOB/AGE: 05/03/1976 45 y.o.  Admit date: 06/18/2021 Discharge date: 06/19/2021  Admission Diagnoses:  Principal Problem:   Other spondylosis with radiculopathy, cervical region Active Problems:   Pseudarthrosis after fusion or arthrodesis   Status post cervical spinal fusion   Discharge Diagnoses:  Principal Problem:   Other spondylosis with radiculopathy, cervical region Active Problems:   Pseudarthrosis after fusion or arthrodesis   Status post cervical spinal fusion  status post Procedure(s): POSTERIOR CERVICAL FUSION CERVICAL FIVE THROUGH SIX WITH TRIPLE WIRE TECHNIQUE FIXATION AND RIGHT ILIAC CREST BONE GRAFT HARVEST, LEFT CERVIACL SIX THROUGH SEVEN CERVICAL FORAMINAL LAMINOPLASTY  Past Medical History:  Diagnosis Date   Asthma    as a child   GERD (gastroesophageal reflux disease)    Headache    Pneumonia     Surgeries: Procedure(s): POSTERIOR CERVICAL FUSION CERVICAL FIVE THROUGH SIX WITH TRIPLE WIRE TECHNIQUE FIXATION AND RIGHT ILIAC CREST BONE GRAFT HARVEST, LEFT CERVIACL SIX THROUGH SEVEN CERVICAL FORAMINAL LAMINOPLASTY on 06/18/2021   Consultants:   Discharged Condition: Improved  Hospital Course: Victor Huang is an 45 y.o. male who was admitted 06/18/2021 for operative treatment of Other spondylosis with radiculopathy, cervical region. Patient failed conservative treatments (please see the history and physical for the specifics) and had severe unremitting pain that affects sleep, daily activities and work/hobbies. After pre-op clearance, the patient was taken to the operating room on 06/18/2021 and underwent  Procedure(s): POSTERIOR CERVICAL FUSION CERVICAL FIVE THROUGH SIX WITH TRIPLE WIRE TECHNIQUE FIXATION AND RIGHT ILIAC CREST BONE GRAFT HARVEST, LEFT CERVIACL SIX THROUGH SEVEN CERVICAL FORAMINAL LAMINOPLASTY.    Patient was given perioperative antibiotics:  Anti-infectives (From admission, onward)    Start      Dose/Rate Route Frequency Ordered Stop   06/18/21 2000  ceFAZolin (ANCEF) IVPB 2g/100 mL premix        2 g 200 mL/hr over 30 Minutes Intravenous Every 8 hours 06/18/21 1415 06/19/21 0531   06/18/21 0630  ceFAZolin (ANCEF) IVPB 2g/100 mL premix        2 g 200 mL/hr over 30 Minutes Intravenous On call to O.R. 06/18/21 0620 06/18/21 1205   06/18/21 0626  ceFAZolin (ANCEF) 2-4 GM/100ML-% IVPB       Note to Pharmacy: Pauletta Browns   : cabinet override      06/18/21 0626 06/18/21 1829        Patient was given sequential compression devices and early ambulation to prevent DVT.   Patient benefited maximally from hospital stay and there were no complications. At the time of discharge, the patient was urinating/moving their bowels without difficulty, tolerating a regular diet, pain is controlled with oral pain medications and they have been cleared by PT/OT.   Recent vital signs: No data found.   Recent laboratory studies: No results for input(s): WBC, HGB, HCT, PLT, NA, K, CL, CO2, BUN, CREATININE, GLUCOSE, INR, CALCIUM in the last 72 hours.  Invalid input(s): PT, 2   Discharge Medications:   Allergies as of 06/19/2021       Reactions   Ibuprofen Rash        Medication List     STOP taking these medications    HYDROcodone-acetaminophen 5-325 MG tablet Commonly known as: NORCO/VICODIN        Diagnostic Studies: DG Cervical Spine 2 or 3 views  Result Date: 06/18/2021 CLINICAL DATA:  Cervical fusion. EXAM: CERVICAL SPINE - 2-3 VIEW COMPARISON:  MR and prior studies FINDINGS: Intraoperative LATERAL views  of the cervical spine are submitted postoperatively for interpretation. Anterior fusion from C4-C6 again noted. On film 1, a posterior localizer is directed at the C4-5 interspace. On film 2, a posterior localizer is directed at the C5 spinous process. IMPRESSION: Localizers as described. Electronically Signed   By: Harmon Pier M.D.   On: 06/18/2021 16:34   DG Abd 1  View  Result Date: 06/25/2021 CLINICAL DATA:  45 year old male with postoperative pneumobilia EXAM: ABDOMEN - 1 VIEW COMPARISON:  None FINDINGS: There is large amount of stool throughout the colon. No bowel dilatation or evidence of obstruction. No free air or radiopaque calculi. The osseous structures are intact. The soft tissues are unremarkable. IMPRESSION: Constipation. No bowel obstruction. Electronically Signed   By: Elgie Collard M.D.   On: 06/25/2021 20:21   CT CERVICAL SPINE WO CONTRAST  Result Date: 06/18/2021 CLINICAL DATA:  Initial evaluation for cervical radiculopathy, bilateral arm numbness and weakness, prior cervical fusion. EXAM: CT CERVICAL SPINE WITHOUT CONTRAST TECHNIQUE: Multidetector CT imaging of the cervical spine was performed without intravenous contrast. Multiplanar CT image reconstructions were also generated. COMPARISON:  Comparison made with previous CT from 02/14/2021 as well as prior MRI from 03/17/2021. FINDINGS: Alignment: Straightening of the normal cervical lordosis. No listhesis or interval malalignment. Skull base and vertebrae: Visualized skull base intact. Normal C1-2 articulations are preserved. Vertebral body height maintained without acute or interval fracture. No discrete or worrisome osseous lesions. Patient status post ACDF at C4-5 and C5-6. 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 Postoperative changes from recent posterior fusion with cerclage wires about the C5 and C6 spinous processes. Additionally, postoperative changes from recent left-sided foraminotomy at C6-7 and C7-T1. Soft tissues and spinal canal: Few scattered foci of postoperative gas present within the epidural space. No discrete epidural collection or other complication. Additional postoperative stranding and emphysema seen along the midline of the posterior neck. No discrete paraspinous collection. Overlying skin  staples in place. Prevertebral soft tissues demonstrate no acute finding. Disc levels: C2-3: Small right paracentral disc protrusion minimally indents the right ventral thecal sac (series 5, image 43). No significant spinal stenosis. Foramina appear patent. C3-4: Mild disc bulge, eccentric to the right. Mild flattening of the ventral thecal sac with no more than mild spinal stenosis. Mild bilateral uncovertebral hypertrophy with mild left C4 foraminal stenosis. Right neural foramina remains patent. C4-5: Prior fusion.  No residual canal or foraminal stenosis. C5-6: Prior fusion with pseudoarthrosis. Residual endplate and uncovertebral spurring. Posterior endplate spurring mildly indents the ventral thecal sac on the right (series 5, image 73). No significant residual spinal stenosis. Sequelae of interval posterior fusion with cerclage wires in place. Residual mild to moderate left C6 foraminal narrowing. Stable right foraminal patency. C6-7: Postoperative changes from interval left foraminotomy. Few scattered foci of postoperative gas. No spinal stenosis. Left neural foramen appears widely patent. Right-sided uncovertebral spurring with no more than mild right C7 foraminal stenosis. C7-T1: Sequelae of interval left foraminotomy. Few scattered foci of postoperative gas. No spinal stenosis. Left-sided uncovertebral and facet hypertrophy with no more than mild left C8 foraminal stenosis. Right neural foramina remains patent. Upper chest: Visualized upper chest demonstrates no acute finding. Paraseptal emphysema. Other: None. IMPRESSION: 1. Postoperative changes from recent left-sided foraminotomy at C6-7 and C7-T1 without complication. Improved left-sided foraminal patency at these levels. 2. Postoperative changes from interval ACDF at C4-5 and C5-6 with persistent pseudoarthrosis at C5-6, stable. No other hardware complication.  3. Additional mild multilevel spondylosis as above, otherwise grossly stable from recent  MRI from 03/17/2021. Electronically Signed   By: Rise Mu M.D.   On: 06/18/2021 22:34   MR CERVICAL SPINE W WO CONTRAST  Result Date: 06/22/2021 CLINICAL DATA:  Initial evaluation for acute neck pain, right upper extremity weakness, history of recent surgery on 06/18/2021. EXAM: MRI CERVICAL SPINE WITHOUT AND WITH CONTRAST TECHNIQUE: Multiplanar and multiecho pulse sequences of the cervical spine, to include the craniocervical junction and cervicothoracic junction, were obtained without and with intravenous contrast. CONTRAST:  7mL GADAVIST GADOBUTROL 1 MMOL/ML IV SOLN COMPARISON:  Prior CT from 06/18/2021 as well as previous MRI from 03/17/2021. FINDINGS: Alignment: Straightening of the normal cervical lordosis. No listhesis or interval subluxation. Vertebrae: Susceptibility artifact from prior ACDF at C4-5 and C5-6. No pseudoarthrosis at C5-6 better appreciated on prior CT. Additionally, there are postoperative changes from recent posterior fusion at C5-6 as well. Additionally, patient has undergone recent left-sided foraminotomy at C6-7 and C7-T1. Vertebral body height maintained with no acute, subacute, or chronic fracture. Underlying bone marrow signal intensity within normal limits. No evidence for osteomyelitis discitis or septic arthritis. Cord: Abnormal cord signal intensity seen involving the central and right hemicord at the levels of C4-5 and C5-6, suggesting edema, and concerning for possible spinal cord injury (series 8, images 16, 20). Possible small focus of associated hemorrhage at the level of C5 (series 9, image 18). No epidural hematoma or other collection. Mild diffuse dural enhancement about the dorsal epidural space extending from C4 through C7-T1 felt to be within normal limits for normal expected postoperative changes. Otherwise, signal intensity within the cervical spinal cord is within normal limits. Posterior Fossa, vertebral arteries, paraspinal tissues: Visualized brain  and posterior fossa within normal limits. Craniocervical junction normal. Postoperative changes related to recent posterior fusion seen at C5-6. Small residual collection along the posterior midline incision at about the surgical site measures approximately 2.3 x 2.5 x 2.0 cm, felt to be most consistent with a benign postoperative seroma (series 8, image 34). No convincing evidence for CSF leak. Normal expected surrounding postoperative edema and enhancement within the adjacent posterior paraspinous soft tissues. Prevertebral soft tissues are normal. Normal flow voids seen within the vertebral arteries bilaterally. Disc levels: C2-C3: Unremarkable. C3-C4: Mild disc bulge with uncovertebral spurring. Superimposed shallow right paracentral disc protrusion contacts and minimally flattens the right ventral cord. No cord signal changes. Stable mild spinal stenosis. Mild bilateral C4 foraminal narrowing also relatively unchanged. C4-C5:  Prior fusion.  No residual canal or foraminal stenosis. C5-C6: Prior fusion. No residual spinal stenosis. Residual uncovertebral hypertrophy with mild left C6 foraminal narrowing, stable. Stable right foraminal patency. C6-C7: Disc bulge with uncovertebral spurring. Flattening and partial effacement of the ventral thecal sac with no more than mild spinal stenosis. Interval left foraminotomy with no significant residual foraminal stenosis. Mild right C7 foraminal narrowing is relatively stable. C7-T1: Minimal annular disc bulge. Postoperative changes from interval left foraminotomy with improved left foraminal patency. Thecal sac remains patent. No significant right foraminal narrowing. Visualized upper thoracic spine demonstrates no significant finding. IMPRESSION: 1. Postoperative changes related to recent posterior fusion at C5-6, with left-sided foraminotomy at C6-7 and C7-T1. Improved left foraminal patency at these levels. 2. Abnormal cord signal intensity involving the central and  right hemicord at the levels of C4-5 and C5-6, suggesting edema, and concerning for spinal cord injury. Question small focus of associated hemorrhage at the level of C5. 3. 2.3 x 2.5 x  2.0 cm collection along the posterior midline incision, felt to be most consistent with a benign postoperative seroma. 4. Otherwise stable mild multilevel cervical spondylosis as above. Electronically Signed   By: Rise MuBenjamin  McClintock M.D.   On: 06/22/2021 19:37   XR Cervical Spine 2 or 3 views  Result Date: 07/04/2021 Radiographs with hardware present Anterior plate C4 to C6 and posterior interspinous process wiring and graft wiring in good Position and alignment.    Discharge Instructions     Call MD / Call 911   Complete by: As directed    If you experience chest pain or shortness of breath, CALL 911 and be transported to the hospital emergency room.  If you develope a fever above 101 F, pus (white drainage) or increased drainage or redness at the wound, or calf pain, call your surgeon's office.   Constipation Prevention   Complete by: As directed    Drink plenty of fluids.  Prune juice may be helpful.  You may use a stool softener, such as Colace (over the counter) 100 mg twice a day.  Use MiraLax (over the counter) for constipation as needed.   Diet - low sodium heart healthy   Complete by: As directed    Discharge instructions   Complete by: As directed    No lifting greater than 10 lbs. No overhead use of arms. Avoid bending,and twisting neck. Walk in house for first week them may start to get out slowly increasing distance up to one mile by 3 weeks post op. Keep incision dry for 3 days, may then bathe and wet incision when showering. Call if any fevers >101, chills, or increasing numbness or weakness or increased swelling or drainage.   Driving restrictions   Complete by: As directed    No driving for 6 weeks   Increase activity slowly as tolerated   Complete by: As directed    Lifting restrictions    Complete by: As directed    No lifting for 6 weeks   Post-operative opioid taper instructions:   Complete by: As directed    POST-OPERATIVE OPIOID TAPER INSTRUCTIONS: It is important to wean off of your opioid medication as soon as possible. If you do not need pain medication after your surgery it is ok to stop day one. Opioids include: Codeine, Hydrocodone(Norco, Vicodin), Oxycodone(Percocet, oxycontin) and hydromorphone amongst others.  Long term and even short term use of opiods can cause: Increased pain response Dependence Constipation Depression Respiratory depression And more.  Withdrawal symptoms can include Flu like symptoms Nausea, vomiting And more Techniques to manage these symptoms Hydrate well Eat regular healthy meals Stay active Use relaxation techniques(deep breathing, meditating, yoga) Do Not substitute Alcohol to help with tapering If you have been on opioids for less than two weeks and do not have pain than it is ok to stop all together.  Plan to wean off of opioids This plan should start within one week post op of your joint replacement. Maintain the same interval or time between taking each dose and first decrease the dose.  Cut the total daily intake of opioids by one tablet each day Next start to increase the time between doses. The last dose that should be eliminated is the evening dose.           Follow-up Information     Kerrin ChampagneNitka, Maisley Hainsworth E, MD Follow up in 1 week(s).   Specialty: Orthopedic Surgery Why: For wound re-check Contact information: [address] [place] [address] [phone]  Discharge Plan:  discharge to home  Disposition:     Signed: Zonia Kief  07/10/2021, 3:59 PM

## 2021-07-11 ENCOUNTER — Telehealth: Payer: Self-pay | Admitting: Specialist

## 2021-07-11 ENCOUNTER — Other Ambulatory Visit: Payer: Self-pay | Admitting: Specialist

## 2021-07-11 MED ORDER — HYDROCODONE-ACETAMINOPHEN 7.5-325 MG PO TABS: 1.0000 | ORAL_TABLET | Freq: Four times a day (QID) | ORAL | 0 refills | Status: DC | PRN

## 2021-07-11 NOTE — Telephone Encounter (Signed)
Patient called requesting a call back. Patient is asking to switch his oxycodone from capsule to tablets. Pt states the capsules is fasting working but don't last as long as the tablets and would like to switch to tablets. Please send oxycodone tablets to pharmacy on file. Patient phone number is [phone].

## 2021-07-11 NOTE — Telephone Encounter (Signed)
Please see below and advise. Patient is requesting tablets instead of capsules.

## 2021-07-11 NOTE — Telephone Encounter (Signed)
Was unable to talk to him before I left 

## 2021-07-11 NOTE — Telephone Encounter (Signed)
Sending to you to discuss with Dr. Otelia Sergeant. I did not want to enter it as refill request as he is asking for tablets instead of capsules. Thanks.

## 2021-07-12 ENCOUNTER — Emergency Department (HOSPITAL_COMMUNITY): Payer: 59

## 2021-07-12 ENCOUNTER — Other Ambulatory Visit: Payer: Self-pay | Admitting: Specialist

## 2021-07-12 ENCOUNTER — Emergency Department (HOSPITAL_COMMUNITY)
Admission: EM | Admit: 2021-07-12 | Discharge: 2021-07-12 | Discharge status: Against Medical Advice | Payer: 59 | Attending: Student | Admitting: Student

## 2021-07-12 DIAGNOSIS — R42 Dizziness and giddiness: Secondary | ICD-10-CM | POA: Diagnosis not present

## 2021-07-12 DIAGNOSIS — M792 Neuralgia and neuritis, unspecified: Secondary | ICD-10-CM | POA: Insufficient documentation

## 2021-07-12 DIAGNOSIS — R202 Paresthesia of skin: Secondary | ICD-10-CM | POA: Insufficient documentation

## 2021-07-12 DIAGNOSIS — Z5321 Procedure and treatment not carried out due to patient leaving prior to being seen by health care provider: Secondary | ICD-10-CM | POA: Diagnosis not present

## 2021-07-12 DIAGNOSIS — R208 Other disturbances of skin sensation: Secondary | ICD-10-CM | POA: Insufficient documentation

## 2021-07-12 DIAGNOSIS — R0989 Other specified symptoms and signs involving the circulatory and respiratory systems: Secondary | ICD-10-CM | POA: Insufficient documentation

## 2021-07-12 LAB — CBC WITH DIFFERENTIAL/PLATELET
Abs Immature Granulocytes: 0.14 10*3/uL — ABNORMAL HIGH (ref 0.00–0.07)
Basophils Absolute: 0.1 10*3/uL (ref 0.0–0.1)
Basophils Relative: 1 %
Eosinophils Absolute: 0.3 10*3/uL (ref 0.0–0.5)
Eosinophils Relative: 2 %
HCT: 42 % (ref 39.0–52.0)
Hemoglobin: 14.1 g/dL (ref 13.0–17.0)
Immature Granulocytes: 1 %
Lymphocytes Relative: 35 %
Lymphs Abs: 5.5 10*3/uL — ABNORMAL HIGH (ref 0.7–4.0)
MCH: 29.6 pg (ref 26.0–34.0)
MCHC: 33.6 g/dL (ref 30.0–36.0)
MCV: 88.2 fL (ref 80.0–100.0)
Monocytes Absolute: 1.3 10*3/uL — ABNORMAL HIGH (ref 0.1–1.0)
Monocytes Relative: 8 %
Neutro Abs: 8.1 10*3/uL — ABNORMAL HIGH (ref 1.7–7.7)
Neutrophils Relative %: 53 %
Platelets: 425 10*3/uL — ABNORMAL HIGH (ref 150–400)
RBC: 4.76 MIL/uL (ref 4.22–5.81)
RDW: 13.9 % (ref 11.5–15.5)
WBC: 15.4 10*3/uL — ABNORMAL HIGH (ref 4.0–10.5)
nRBC: 0 % (ref 0.0–0.2)

## 2021-07-12 LAB — BASIC METABOLIC PANEL
Anion gap: 7 (ref 5–15)
BUN: 20 mg/dL (ref 6–20)
CO2: 29 mmol/L (ref 22–32)
Calcium: 9.1 mg/dL (ref 8.9–10.3)
Chloride: 102 mmol/L (ref 98–111)
Creatinine, Ser: 0.76 mg/dL (ref 0.61–1.24)
GFR, Estimated: >60 mL/min (ref >60)
Glucose, Bld: 94 mg/dL (ref 70–99)
Potassium: 4.5 mmol/L (ref 3.5–5.1)
Sodium: 138 mmol/L (ref 135–145)

## 2021-07-12 LAB — CORTISOL: Cortisol, Plasma: 1.1 ug/dL

## 2021-07-12 NOTE — ED Triage Notes (Signed)
Pt c/o "complications" from C4-6 surgery 6/20, yesterday last day of steroids, pt endorses burning in lumbar spine, pressure in back muscles, dizziness, nerve pain ("electricity") everywhere, continued R sided numbness. States he is unable to smoke cigarettes due to "heart pumping outside of chest"

## 2021-07-12 NOTE — ED Provider Notes (Signed)
Emergency Medicine Provider Triage Evaluation Note  Victor Huang , a 45 y.o. male  was evaluated in triage.  Pt complains of dizziness, myalgias, lightheadedness, and palpitations that started yesterday. Patient states he finished his prednisone yesterday. 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 Patient was then put on a taper which he finished yesterday. Patient notes he had similar symptoms when he was in an adrenal crisis which resolved after prednisone; however returned yesterday. He also endorses tightness around his neck. No fever or chills. He states he has a burning sensation that radiates up his back to his neck. No upper extremity weakness.   Review of Systems  Positive: Palpitations, lightheadedness Negative: fever  Physical Exam  BP (!) 141/99 (BP Location: Right Arm)   Pulse 97   Temp 98.4 F (36.9 C) (Oral)   Resp 20   SpO2 100%  Gen:   Awake, no distress   Resp:  Normal effort  MSK:   Moves extremities without difficulty  Other:    Medical Decision Making  Medically screening exam initiated at 8:04 PM.  Appropriate orders placed.  Victor Huang was informed that the remainder of the evaluation will be completed by another provider, this initial triage assessment does not replace that evaluation, and the importance of remaining in the ED until their evaluation is complete.  Adrenal crisis labs ordered CT cervical spine   Victor Huang 07/12/21 2010    Victor Dibbles, MD 07/15/21 [phone]

## 2021-07-12 NOTE — ED Notes (Signed)
No response x3, moving OTF.

## 2021-07-12 NOTE — ED Notes (Addendum)
CT tech called pt name and no response. Tech called for vitals, no response. Registration stated they saw pt walk out ad has not returned.

## 2021-07-14 LAB — ACTH: C206 ACTH: <1.5 pg/mL — ABNORMAL LOW (ref 7.2–63.3)

## 2021-07-15 ENCOUNTER — Other Ambulatory Visit: Payer: Self-pay | Admitting: Specialist

## 2021-07-15 ENCOUNTER — Other Ambulatory Visit: Payer: Self-pay | Admitting: Orthopaedic Surgery

## 2021-07-15 DIAGNOSIS — G8383 Posterior cord syndrome: Secondary | ICD-10-CM

## 2021-07-15 DIAGNOSIS — G959 Disease of spinal cord, unspecified: Secondary | ICD-10-CM

## 2021-07-15 DIAGNOSIS — Z981 Arthrodesis status: Secondary | ICD-10-CM

## 2021-07-15 DIAGNOSIS — G971 Other reaction to spinal and lumbar puncture: Secondary | ICD-10-CM

## 2021-07-15 DIAGNOSIS — R0989 Other specified symptoms and signs involving the circulatory and respiratory systems: Secondary | ICD-10-CM

## 2021-07-15 MED ORDER — OXYCODONE HCL ER 20 MG PO T12A: 20.0000 mg | EXTENDED_RELEASE_TABLET | Freq: Two times a day (BID) | ORAL | 0 refills | Status: DC

## 2021-07-15 MED ORDER — PREGABALIN 75 MG PO CAPS: ORAL_CAPSULE | ORAL | 0 refills | Status: DC

## 2021-07-15 MED ORDER — OXYCODONE HCL 5 MG PO CAPS: 5.0000 mg | ORAL_CAPSULE | ORAL | 0 refills | Status: DC | PRN

## 2021-07-17 ENCOUNTER — Ambulatory Visit: Payer: 59 | Admitting: Neurology

## 2021-07-17 ENCOUNTER — Other Ambulatory Visit: Payer: Self-pay

## 2021-07-17 ENCOUNTER — Ambulatory Visit: Payer: 59 | Admitting: Occupational Therapy

## 2021-07-17 ENCOUNTER — Ambulatory Visit: Payer: 59 | Admitting: Rehabilitative and Restorative Service Providers"

## 2021-07-17 ENCOUNTER — Encounter: Payer: Self-pay | Admitting: Neurology

## 2021-07-17 VITALS — BP 129/84 | HR 93 | Ht 69.0 in | Wt 183.0 lb

## 2021-07-17 DIAGNOSIS — G959 Disease of spinal cord, unspecified: Secondary | ICD-10-CM | POA: Diagnosis not present

## 2021-07-17 MED ORDER — ALPRAZOLAM 0.5 MG PO TABS: ORAL_TABLET | ORAL | 0 refills | Status: DC

## 2021-07-17 MED ORDER — TIZANIDINE HCL 4 MG PO TABS: 4.0000 mg | ORAL_TABLET | Freq: Four times a day (QID) | ORAL | 1 refills | Status: DC

## 2021-07-17 NOTE — Progress Notes (Signed)
Reason for visit: Cervical myelopathy  Referring physician: Dr. Kelby Fam is a 45 y.o. male  History of present illness:  Victor Huang is a 45 year old right-handed Hispanic male with a history of prior cervical spine surgery.  The patient has been complaining of chronic left shoulder, neck, and hand discomfort and some weakness in the left hand.  He underwent surgery for this on 18 June 2021.  Unfortunately, during surgery had a complication with a spinal cord injury.  He returned to the hospital 2 days after the surgery with severe pain and sensory alteration in the right upper chest, upper back, and down the right arm.  He had sensory alteration as well into the upper right thigh and groin area.  He has had electric shock sensations and tingling paresthesias into the hand that is persistent.  If he moves his head or neck he may get shock sensations down the right leg as well.  He has a sensation of weakness in the right arm.  He has not had any alteration in his balance.  His bowels and bladder are working fairly well.  He has some discomfort around the right buttocks area with a took the bone graft.  He has been placed on steroids and remains on Medrol.  He has been some concerns about adrenal insufficiency.  The patient may have episodes of increased heart rate and flushing.  He reports that at times his neck and shoulders get quite tight, the use of tizanidine has helped to some.  He is on amitriptyline taking 75 mg at night and he is on Lyrica taking 75 mg 3 times daily.  He is set up for an MRI of the cervical spine tomorrow.  He is sent to this office for further evaluation.  Past Medical History:  Diagnosis Date   Asthma    as a child   GERD (gastroesophageal reflux disease)    Headache    Pneumonia     Past Surgical History:  Procedure Laterality Date   BACK SURGERY     POSTERIOR CERVICAL FUSION/FORAMINOTOMY N/A 06/18/2021   Procedure: POSTERIOR CERVICAL FUSION  CERVICAL FIVE THROUGH SIX WITH TRIPLE WIRE TECHNIQUE FIXATION AND RIGHT ILIAC CREST BONE GRAFT HARVEST, LEFT CERVIACL SIX THROUGH SEVEN CERVICAL FORAMINAL LAMINOPLASTY;  Surgeon: Kerrin Champagne, MD;  Location: [facility] OR;  Service: Orthopedics;  Laterality: N/A;    History reviewed. No pertinent family history.  Social history:  reports that he has been smoking cigarettes. He started smoking about 43 years ago. He has been smoking an average of 1 pack per day. He has never used smokeless tobacco. He reports previous drug use. He reports that he does not drink alcohol.  Medications:  Prior to Admission medications   Medication Sig Start Date End Date Taking? Authorizing Provider  amitriptyline (ELAVIL) 75 MG tablet Take 1 tablet (75 mg total) by mouth at bedtime. 06/26/21  Yes Kerrin Champagne, MD  diclofenac Sodium (VOLTAREN) 1 % GEL Apply 2 g topically 4 (four) times daily. 06/26/21  Yes Kerrin Champagne, MD  docusate sodium (COLACE) 100 MG capsule Take one po four times daily, then decrease to one po bid when bowels become more regular. 06/26/21  Yes Kerrin Champagne, MD  LINZESS 72 MCG capsule TAKE 1 CAPSULE BY MOUTH DAILY BEFORE BREAKFAST. 07/02/21  Yes Kerrin Champagne, MD  magic mouthwash SOLN Take 5 mLs by mouth 4 (four) times daily as needed for mouth pain (swish and  swallow). 06/26/21  Yes Kerrin Champagne, MD  methylPREDNISolone (MEDROL DOSEPAK) 4 MG TBPK tablet Take as directed 6 day dose pak 07/07/21  Yes Kerrin Champagne, MD  oxycodone (OXY-IR) 5 MG capsule Take 1 capsule (5 mg total) by mouth every 4 (four) hours as needed for pain (for breakthrough pain). 07/15/21  Yes Kerrin Champagne, MD  oxyCODONE (OXYCONTIN) 20 mg 12 hr tablet Take 1 tablet (20 mg total) by mouth every 12 (twelve) hours for 7 days. 06/26/21 07/15/22 Yes Kerrin Champagne, MD  oxyCODONE (OXYCONTIN) 20 mg 12 hr tablet Take 1 tablet (20 mg total) by mouth every 12 (twelve) hours. 07/15/21  Yes Kerrin Champagne, MD  pregabalin (LYRICA) 75 MG capsule  Take one capsule today in place of dose of gabapentin and continue with gabapentin remaining doses for one day then take one capsule in the morning and one in the evening and take the gabapentin one time in the afternoon then start on one capsule twice a day 8 AM and 8 PM 07/15/21  Yes Kerrin Champagne, MD  sodium phosphate (FLEET) 7-19 GM/118ML ENEM Place 133 mLs (1 enema total) rectally daily as needed for severe constipation. 06/26/21  Yes Kerrin Champagne, MD  tiZANidine (ZANAFLEX) 4 MG tablet Take 1 tablet (4 mg total) by mouth every 6 (six) hours as needed for muscle spasms. 07/07/21  Yes Kerrin Champagne, MD      Allergies  Allergen Reactions   Ibuprofen Rash    ROS:  Out of a complete 14 system review of symptoms, the patient complains only of the following symptoms, and all other reviewed systems are negative.  Neck pain, right arm discomfort Right buttock pain  Blood pressure 129/84, pulse 93, height 5\' 9"  (1.753 m), weight 183 lb (83 kg).  Physical Exam  General: The patient is alert and cooperative at the time of the examination.  Eyes: Pupils are equal, round, and reactive to light. Discs are flat bilaterally.  Neck: The neck is supple, no carotid bruits are noted.  Respiratory: The respiratory examination is clear.  Cardiovascular: The cardiovascular examination reveals a regular rate and rhythm, no obvious murmurs or rubs are noted.  Skin: Extremities are without significant edema.  Neurologic Exam  Mental status: The patient is alert and oriented x 3 at the time of the examination. The patient has apparent normal recent and remote memory, with an apparently normal attention span and concentration ability.  Cranial nerves: Facial symmetry is present. There is good sensation of the face to pinprick and soft touch bilaterally. The strength of the facial muscles and the muscles to head turning and shoulder shrug are normal bilaterally. Speech is well enunciated, no aphasia or  dysarthria is noted. Extraocular movements are full. Visual fields are full. The tongue is midline, and the patient has symmetric elevation of the soft palate. No obvious hearing deficits are noted.  Motor: The motor testing reveals 5 over 5 strength of all 4 extremities. Good symmetric motor tone is noted throughout.  Sensory: Sensory testing is intact to pinprick, soft touch, vibration sensation, and position sense on all 4 extremities, with exception of some slight decrease in pinprick sensation on the right arm as compared to the left, altered temperature sensation on the right arm. No evidence of extinction is noted.  Coordination: Cerebellar testing reveals good finger-nose-finger and heel-to-shin bilaterally.  Gait and station: Gait is normal. Tandem gait is normal. Romberg is negative. No drift is seen.  Reflexes:  Deep tendon reflexes are symmetric, but are somewhat brisk bilaterally. Toes are downgoing bilaterally.   MRI cervical 06/22/21:  IMPRESSION: 1. Postoperative changes related to recent posterior fusion at C5-6, with left-sided foraminotomy at C6-7 and C7-T1. Improved left foraminal patency at these levels. 2. Abnormal cord signal intensity involving the central and right hemicord at the levels of C4-5 and C5-6, suggesting edema, and concerning for spinal cord injury. Question small focus of associated hemorrhage at the level of C5. 3. 2.3 x 2.5 x 2.0 cm collection along the posterior midline incision, felt to be most consistent with a benign postoperative seroma. 4. Otherwise stable mild multilevel cervical spondylosis as above.  * MRI scan images were reviewed online. I agree with the written report.    Assessment/Plan:  1.  Cervical myelopathy  2.  Neuropathic pain, right upper extremity  The patient is having significant pain and dysesthesias involving the right arm and upper shoulder following a lateral spinal cord injury on the right.  The patient  fortunately does not have any bladder dysfunction or gait dysfunction.  He is having a lot of spasm in the neck.  The Lyrica was just recently added and can be increased over time.  He will be increased on the Zanaflex dose to 4 mg every 6 hours on a scheduled basis.  A prescription was sent in.  The patient will follow up here in 3 months.  He currently is on steroids, he will likely need to be on this for another 2 to 3 weeks and then attempt a slow taper.  MRI of the cervical spine is to be done tomorrow.  Marlan Palau MD 07/17/2021 2:20 PM  [facility] [address] [place], [place] [address]  Phone [phone] Fax [phone]

## 2021-07-18 ENCOUNTER — Ambulatory Visit (HOSPITAL_COMMUNITY)
Admission: RE | Admit: 2021-07-18 | Discharge: 2021-07-18 | Disposition: A | Discharge status: Home / Self Care | Payer: 59 | Source: Ambulatory Visit | Attending: Specialist | Admitting: Specialist

## 2021-07-18 ENCOUNTER — Other Ambulatory Visit: Payer: Self-pay

## 2021-07-18 DIAGNOSIS — R0989 Other specified symptoms and signs involving the circulatory and respiratory systems: Secondary | ICD-10-CM | POA: Diagnosis present

## 2021-07-18 DIAGNOSIS — G8383 Posterior cord syndrome: Secondary | ICD-10-CM

## 2021-07-18 DIAGNOSIS — G959 Disease of spinal cord, unspecified: Secondary | ICD-10-CM | POA: Insufficient documentation

## 2021-07-18 DIAGNOSIS — G971 Other reaction to spinal and lumbar puncture: Secondary | ICD-10-CM

## 2021-07-18 DIAGNOSIS — M5412 Radiculopathy, cervical region: Secondary | ICD-10-CM | POA: Diagnosis present

## 2021-07-18 MED ORDER — GADOBUTROL 1 MMOL/ML IV SOLN
8.5000 mL | Freq: Once | INTRAVENOUS | Status: AC | PRN
  Administered 2021-07-18: 8 mL via INTRAVENOUS

## 2021-07-20 LAB — ALDOSTERONE + RENIN ACTIVITY W/ RATIO
ALDO / PRA Ratio: 2.4 (ref 0.0–30.0)
Aldosterone: 3.8 ng/dL (ref 0.0–30.0)
PRA LC/MS/MS: 1.586 ng/mL/hr (ref 0.167–5.380)

## 2021-07-21 ENCOUNTER — Other Ambulatory Visit: Payer: Self-pay | Admitting: Specialist

## 2021-07-21 MED ORDER — MORPHINE SULFATE ER 15 MG PO T12A: 1.0000 | EXTENDED_RELEASE_TABLET | Freq: Two times a day (BID) | ORAL | 0 refills | Status: DC

## 2021-07-21 MED ORDER — OXYCODONE HCL 5 MG PO CAPS: 5.0000 mg | ORAL_CAPSULE | ORAL | 0 refills | Status: DC | PRN

## 2021-07-25 ENCOUNTER — Other Ambulatory Visit: Payer: Self-pay | Admitting: Specialist

## 2021-07-25 DIAGNOSIS — M4712 Other spondylosis with myelopathy, cervical region: Secondary | ICD-10-CM

## 2021-07-25 DIAGNOSIS — S14155A Other incomplete lesion at C5 level of cervical spinal cord, initial encounter: Secondary | ICD-10-CM

## 2021-07-25 DIAGNOSIS — Z981 Arthrodesis status: Secondary | ICD-10-CM

## 2021-07-25 NOTE — Op Note (Signed)
06/18/2021  2:16 PM  PATIENT:  Victor Huang  45 y.o. 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 Dr. Gloris Manchester.   EBL:150cc  DRAINS: In and Out foley catherization performed.     COMPLICATION: Post fixation radiographs shows fixation of the C6-7 level with interspinous process wiring, the wiring was removed and replaced at the correct level C5-6. Consequently initial foramenotomy was at the C7-T1 level, one level below the expected C6-7 level and a second foramenotomy was performed at the correct level C6-7 left side and this was done without difficulty. 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       PROCEDURE:The patient was met in the holding area, and the appropriate cervical left C6-7 and C5-6 levels identified and marked with "x" and my initials. All questions were answered and informed  consent signed.   The patient was then transported to OR. The patient was then placed under general anesthesia without difficulty and transferred to the operating room table prone position Mayfield horseshoe with chest rolls. All pressure points well-padded PAS stockings.Shoulders were taped to provide better evaluation of the cervical spine on radiograph.  The patient received appropriate preoperative antibiotic prophylaxis.Time-out procedure was called and correct.   Sterile prep with DuraPrep and draped in the usual manner the shoulders were taped downwards and skin traction over the skin of the neck. Following DuraPrep draped in the usual manner. After timeout protocol incision was made approximately C5 to C7 in the midline. This following infiltration of skin and subcutaneous layers with exparel 1.3% 1:1 marcaine 0.25% total of 10 cc. Incision carried through skin and subcutaneous layers using 10 blade scalpel and electrocautery down to the level ligamentum nuchae. Incision made centered on the spinous process of C5-C6 Clamps then placed at the spinous process of expected [employer]Rank.tn Cross table lateral I identified the clamp at the C4-5 levels. Counting down one further spinous process then a single 0 Vicryl suture was used to mark the spinous process of C5. Electrocautery then used to carefully incise the cervical muscles off the bilateral lateral aspect of the spinous process of C5,C6, C7 and T1. Carefully removing spinous muscles off of the inferior aspect lamina at C6 exposing the C 6-T 1 posterior aspect of the interlaminar space. The magnification headlamp were used during this portion procedure. Boss McCollough retractor was inserted. The operating room microscope was draped sterilely and brought into the field. A 20 Gauge cable was then passed superior to the spinous  process of the expected C5 spinous process then circumferentially about the spinous process of C5. The free end of the cable  then passed inferior to the spinous process of the expected C6 level then threaded into the opening on the opposite end of the cable used for crimping and fixing the cable. The cable end then passed through the handle for tightening and crimping the cable. This was then tightened and the cylinder about the cable crimped.  A round cutting burr then used to make a opening into the cortex at the base of the C5 and C6 spinous processes. Right posterior iliac crest bone graft was then harvested through a separate incision. The incision approximately 5cm in length oblique in line with the right posterior medial iliac crest. The skin incised and  the subcutaneous layers divided down to the right posterior iliac crest lateral to the PSIS. The  Periosteum overlying the right iliac crest then incised and the guteal muscle subperiosteally elevated off the superior rim and portion of the iliac crest. Self retaining retractors used to expose the crest and  A quarter inch osteotome used to score the superficial cortex for a 3cm x 4cm area and the superior iliac crest then scored with a straight 1/2 inch osteotome. A curved 1/2 inch osteotome then used to elevate the superficial cortex away from the underlying cancelous Intercortical bone and the inferior cortex of the graft window then divided with a 1/2 curved osteotome. The window flap of corticobone then removed and additional cancellous bone graft obtained from the intercortical cancellous graft exposed using a medium sized currette. This graft then placed in a moistened sponge. Returning to the C5-6 fusion level the openings for the placement of the wire for holding the graft in place was made through and through Using first a towel clip then the concept tray lewen clamp. The 45 G Cable used was then passed through a hole in the corticocancellous  Graft then through the opening at the expected C5 level through the hole in the second portion of corticocancellous graft  then threaded  Through the lower hole in the corticocancelous graft then the hole at the base of the spinous process of the expected C6 then through the hole at the inferior portion of the graft and then threaded through the grommet at the end of the cable for tightening and crimping of the grommet. Cancellous bone was then placed between the corticocancellous graft and the spinous processed expected C5 and C6. The graft then synched into place by tightening the cable via the [employer]. Then the grommet crimped and the cable then divided.  Attention then turned to performing the left C6-7 foramenotomy. High-speed bur was used to remove a small portion of bone from the inferior aspect of lamina of C6 and superior C7, and the medial 20% of the intra-articular process of C6-7. Further thinning the superior aspect of the lamina of C7. A 1 mm Kerrison was then used to remove ligamentum flavum from superior aspect of the lamina C7 and the medial aspect of the inferior articular process of C6 approximately 20% exposing the superior articular process of C7.  A 1 mm Kerrison was used to remove bone off the superior aspect of the lamina of C7 and then resecting 20% of the medial aspect of the superior articular process of C7. Ligamentum flavum then easily lifted andand electrocautery used to cauterize epidural veins deep to  the ligamentum flavum and the 5 vascular leash overlying the C7  nerve root  was then resected.  Under the operating room microscope the epidural vein layer overlying the posterior aspect of the thecal sac and the C7  nerve root was then carefully lifted using a micro-titanium were cauterized using bipolar electrocautery the 15 blade scalpel then used to incise this overlying the C7  nerve root releasing the vascular leash a forward angle 3-0 microcurette then used to remove a small portion of bone off the superior and medial aspect of the pedicle further mobilizing the C 7 nerve root  bipolar electrocautery to control all bleeding within the axillary area and C7  nerve. Bone wax was applied to bleeding cancellus bone surfaces are excellent hemostasis obtained for C7  nerve root was tracked superiorly and laterally nerve hook. The disc explored using a [place]field 4 found to be mass effect was felt to represent uncovertebral spur. Following this then hemostasis was obtained using thrombin-soaked Gelfoam and micro-pledgettes. When complete hemostasis was obtained all trial was removed I nerve hook could be easily passed out the neuroforamen without the lateral aspect of the C7  pedicle demonstrate the C7  neuroforamen completely decompressed. At this point radiographs were repeated to verify the instrumentation And the cross table lateral radiograph demonstrated the posterior spinous wiring to be fixing the C6-7 spinous processes. No decortication of the facets had be performed so that removing the cable and graft and replacing at the C5-6 level with performing the foramenotomy at the correct level left C6-7 was decided. The cable for graft fixation was then divided on both sides of the spinous process and removed. The graft and cancellous graft was  Preserved with moist sponges for replacement. The cerclage cable  About C6-7 was then divided and removed.  A new 20G cable then placed circumferentially about the spinous processs of C5 then threaded inferior to the spinous process of C6. The cable then passed through the grommet on the opposite end of the cable and the cable tightener and crimper then used to tighten the interspinous process cerclage cable. Using a 2.5 mm round cutting burr an opening then made into the spinous process base at C5 and the openings made through with a towel clip the the concept Tray lewen clamp. The 20G wire then passed through the corticocancellous strip superior hole, attempted passage of the cable through the base of the spinous process at C5 was not able to be  done so that the cable was then passed about the superior aspect of the spinous process of C5. Then through the opening in the corticocancellous bone graft strip and then the inferior left corticocancellous strip then through the hole at the base of the C6 spinous process then the right corticocancellous bone graft strip and into the grommet at the end of the cable. This then placed into the  [employer]. The cable then tightened and the grommet  Crimped fixing the cable and graft to the lateral aspects of the spinous process of C5 and C6. Cancelous graft was placed between the bone graft strips and the spinous processes of C5 and C6.  Attention then turned to performing the left foramenotomy at the planned level C6-7. As the post internal fixation radiograph had identified the fixation to be one level below the expected C5-6 so to the previous foramenotomy had been performed at the level below the expected C6-7.  A high speed burr 2.5 mm cutting used to thin the cortex over the inferior and lateral lamina of C6 removing approximately 20%  of the medial inferior articular process of C6.  A 1 mm Kerrison was then used to remove ligamentum flavum from superior aspect of the lamina C7 and the medial aspect of the inferior articular process of C 6 approximately 20% exposing the superior articular process of C7.  A 1 mm Kerrison was used to remove bone off the superior aspect of the lamina of C7 and then resecting 20% of the medial aspect of the superior articular process of C7. Ligamentum flavum then easily lifted andand electrocautery used to cauterize epidural veins deep to  the ligamentum flavum and the 5 vascular leash overlying the C7 nerve root  was then resected. The operating room microscope was draped sterilely and brought into the field. Under the operating room microscope the epidural vein layer overlying the posterior aspect of the thecal sac and the C7  nerve root was then carefully lifted  using a micro-titanium were cauterized using bipolar electrocautery the 15 blade scalpel then used to incise this overlying the C7  nerve root releasing the vascular leash a forward angle 3-0 microcurette then used to remove a small portion of bone off the superior and medial aspect of the pedicle further mobilizing the C7 nerve root bipolar electrocautery to control all bleeding within the axillary area and C7  nerve. Bone wax was applied to bleeding cancellus bone surfaces are excellent hemostasis obtained for C7  nerve root was tracked superiorly and laterally nerve hook. The obvious posterior mass effect was felt to represent uncovertebral spur. Following this then hemostasis was obtained using thrombin-soaked Gelfoam and micro-pledgettes. When complete hemostasis was obtained all trial was removed I nerve hook could be easily passed out the neuroforamen without the lateral aspect of the C7 pedicle demonstrate the C7  neuroforamen completely decompressed. Irrigation was carried out no active bleeding was present. Following further irrigation and the incision was closed by approximating the ligamentum nuchae with 0 Ethibond sutures. The subcutaneous layers approximated with interrupted 0 Vicryl suture more superficial layers with interrupted 2-0 Vicryl sutures and the skin closed with interrupted 4-0 Vicryl sutures. Dermabond was applied then MedPlex bandage. Soft cervical collar all instrument and sponge counts were correct. Patient was then returned to supine position on her stretcher. Returned to recovery room in satisfactory condition.  Physician assistant's responsibilities: Benjiman Core, PA-C perform the duties of assistant physician and surgeon during this case present from the beginning of the case to the end of the case. He assisted with careful retraction of neural structures suctioning about her elements including cervical cord and C7 and C8 nerve root. Performed closure of the incision on the  ligamentum nuchae to the skin and application of dressing. He assisted in positioning the patient had removal the patient from the OR table to the stretcher.        Basil Dess  07/25/2021, 2:16 PM

## 2021-07-27 ENCOUNTER — Other Ambulatory Visit: Payer: Self-pay | Admitting: Specialist

## 2021-07-27 MED ORDER — OXYCODONE HCL 5 MG PO CAPS: 5.0000 mg | ORAL_CAPSULE | ORAL | 0 refills | Status: DC | PRN

## 2021-07-27 MED ORDER — MORPHINE SULFATE ER 15 MG PO T12A: 1.0000 | EXTENDED_RELEASE_TABLET | Freq: Two times a day (BID) | ORAL | 0 refills | Status: AC

## 2021-08-01 ENCOUNTER — Encounter: Payer: 59 | Admitting: Surgery

## 2021-08-04 ENCOUNTER — Other Ambulatory Visit: Payer: Self-pay | Admitting: Specialist

## 2021-08-04 MED ORDER — MORPHINE SULFATE ER 15 MG PO TBCR: 15.0000 mg | EXTENDED_RELEASE_TABLET | Freq: Two times a day (BID) | ORAL | 0 refills | Status: DC

## 2021-08-04 MED ORDER — OXYCODONE HCL 5 MG PO CAPS: 5.0000 mg | ORAL_CAPSULE | ORAL | 0 refills | Status: DC | PRN

## 2021-08-08 ENCOUNTER — Ambulatory Visit: Payer: Self-pay

## 2021-08-08 ENCOUNTER — Encounter: Payer: Self-pay | Admitting: Specialist

## 2021-08-08 ENCOUNTER — Other Ambulatory Visit: Payer: Self-pay

## 2021-08-08 ENCOUNTER — Ambulatory Visit (INDEPENDENT_AMBULATORY_CARE_PROVIDER_SITE_OTHER): Payer: 59 | Admitting: Specialist

## 2021-08-08 VITALS — BP 132/90 | HR 106 | Ht 69.0 in | Wt 183.0 lb

## 2021-08-08 DIAGNOSIS — Z981 Arthrodesis status: Secondary | ICD-10-CM

## 2021-08-08 DIAGNOSIS — G971 Other reaction to spinal and lumbar puncture: Secondary | ICD-10-CM

## 2021-08-08 DIAGNOSIS — R0989 Other specified symptoms and signs involving the circulatory and respiratory systems: Secondary | ICD-10-CM

## 2021-08-08 MED ORDER — ALLOPURINOL 100 MG PO TABS: 100.0000 mg | ORAL_TABLET | Freq: Every day | ORAL | 3 refills | Status: DC

## 2021-08-08 MED ORDER — NIFEDIPINE ER OSMOTIC RELEASE 30 MG PO TB24: 30.0000 mg | ORAL_TABLET | Freq: Every day | ORAL | 3 refills | Status: DC

## 2021-08-08 NOTE — Patient Instructions (Signed)
Avoid overhead lifting and overhead use of the arms. Do not lift greater than 5 lbs. Adjust head rest in vehicle to prevent hyperextension if rear ended. Take extra precautions to avoid falling, including use of a cane if you feel weak.   

## 2021-08-08 NOTE — Progress Notes (Signed)
   Post-Op Visit Note   Patient: Victor Huang           Date of Birth: 06/28/1976           MRN: [id number] Visit Date: 08/08/2021 PCP: Dema Severin, NP   Assessment & Plan:  Chief Complaint:  Chief Complaint  Patient presents with   Neck - Pain   Visit Diagnoses:  1. S/P cervical spinal fusion     Plan: Avoid overhead lifting and overhead use of the arms. Do not lift greater than 5 lbs. Adjust head rest in vehicle to prevent hyperextension if rear ended. Take extra precautions to avoid falling, including use of a cane if you feel weak.  Follow-Up Instructions: No follow-ups on file.   Orders:  Orders Placed This Encounter  Procedures   XR Cervical Spine 2 or 3 views   No orders of the defined types were placed in this encounter.   Imaging: No results found.  PMFS History: Patient Active Problem List   Diagnosis Date Noted   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/2021    Priority: High    Class: Chronic   Other spondylosis with radiculopathy, cervical region 06/18/2021    Priority: High    Class: Chronic   Cervical myelopathy (HCC) 06/22/2021   Status post cervical spinal fusion 06/18/2021   Past Medical History:  Diagnosis Date   Asthma    as a child   GERD (gastroesophageal reflux disease)    Headache    Pneumonia     No family history on file.  Past Surgical History:  Procedure Laterality Date   BACK SURGERY     POSTERIOR CERVICAL FUSION/FORAMINOTOMY N/A 06/18/2021   Procedure: POSTERIOR CERVICAL FUSION CERVICAL FIVE THROUGH SIX WITH TRIPLE WIRE TECHNIQUE FIXATION AND RIGHT ILIAC CREST BONE GRAFT HARVEST, LEFT CERVIACL SIX THROUGH SEVEN CERVICAL FORAMINAL LAMINOPLASTY;  Surgeon: Kerrin Champagne, MD;  Location: [facility] OR;  Service: Orthopedics;  Laterality: N/A;   Social History   Occupational History   Not on file  Tobacco Use   Smoking status: Every Day    Packs/day: 1.00    Types: Cigarettes    Start date: 01/01/1987   Smokeless  tobacco: Never  Vaping Use   Vaping Use: Never used  Substance and Sexual Activity   Alcohol use: Never    Comment: no beer in over 15 years   Drug use: Not Currently   Sexual activity: Yes

## 2021-08-09 ENCOUNTER — Encounter: Payer: 59 | Admitting: Specialist

## 2021-08-10 ENCOUNTER — Other Ambulatory Visit: Payer: Self-pay | Admitting: Specialist

## 2021-08-10 MED ORDER — OXYCODONE HCL 5 MG PO CAPS: 5.0000 mg | ORAL_CAPSULE | ORAL | 0 refills | Status: DC | PRN

## 2021-08-10 MED ORDER — MORPHINE SULFATE ER 15 MG PO TBCR: 15.0000 mg | EXTENDED_RELEASE_TABLET | Freq: Two times a day (BID) | ORAL | 0 refills | Status: DC

## 2021-08-10 MED ORDER — PREGABALIN 75 MG PO CAPS: 75.0000 mg | ORAL_CAPSULE | Freq: Three times a day (TID) | ORAL | 0 refills | Status: DC

## 2021-08-15 ENCOUNTER — Other Ambulatory Visit: Payer: Self-pay | Admitting: Specialist

## 2021-08-15 MED ORDER — PREGABALIN 100 MG PO CAPS: 100.0000 mg | ORAL_CAPSULE | Freq: Three times a day (TID) | ORAL | 0 refills | Status: DC

## 2021-08-17 ENCOUNTER — Other Ambulatory Visit: Payer: Self-pay | Admitting: Specialist

## 2021-08-17 ENCOUNTER — Telehealth: Payer: Self-pay | Admitting: Specialist

## 2021-08-17 DIAGNOSIS — S14105D Unspecified injury at C5 level of cervical spinal cord, subsequent encounter: Secondary | ICD-10-CM

## 2021-08-17 DIAGNOSIS — M4712 Other spondylosis with myelopathy, cervical region: Secondary | ICD-10-CM

## 2021-08-17 MED ORDER — OXYCODONE HCL 5 MG PO CAPS: 5.0000 mg | ORAL_CAPSULE | ORAL | 0 refills | Status: DC | PRN

## 2021-08-17 MED ORDER — MORPHINE SULFATE ER 15 MG PO TBCR: 15.0000 mg | EXTENDED_RELEASE_TABLET | Freq: Two times a day (BID) | ORAL | 0 refills | Status: AC

## 2021-08-17 NOTE — Telephone Encounter (Signed)
Patient called needing to pick up his paperwork after it is finished by Dr Otelia Sergeant. Patient asked for a call back as soon as possible. The number to contact patient is [phone]

## 2021-08-21 ENCOUNTER — Telehealth: Payer: Self-pay | Admitting: Specialist

## 2021-08-21 NOTE — Telephone Encounter (Signed)
[employer] called and states for the prior auth they need to know if its capsule or tablet? And if you had read the CDC guidelines?   CB [phone] Nicholos Johns

## 2021-08-21 NOTE — Telephone Encounter (Signed)
Received call from Candida with [facility] concerning the Rx for Oxycodone, She asked if patient will be taking the pain medication more than 7 days. Candida said if the patient is going to take the medication more than 7 days the  the doctor will need to increase the quantity so it was stop getting denied. The number to contact Candida is [phone] Ext: [phone]

## 2021-08-22 ENCOUNTER — Other Ambulatory Visit: Payer: Self-pay | Admitting: Surgery

## 2021-08-22 MED ORDER — OXYCODONE HCL 5 MG PO TABS: 5.0000 mg | ORAL_TABLET | ORAL | 0 refills | Status: DC | PRN

## 2021-08-22 NOTE — Telephone Encounter (Signed)
I called and lmom for Candida to call me back.

## 2021-08-22 NOTE — Telephone Encounter (Signed)
I spoke with [facility] they are saying that the Capsules are not covered however the tablets would be. I asked Fayrene Fearing to change the rx from capsules to Tablets, this was done and sent to the pharmacy

## 2021-08-22 NOTE — Telephone Encounter (Signed)
Candida called and lmom, states that Dr. Otelia Sergeant needs to write for larger quantity of pills so that he can keep his meds, she states that the plan only allows for 2 rxs of pain meds in a 29 day period. ---rx has been changed to tablets

## 2021-08-23 ENCOUNTER — Telehealth: Payer: Self-pay | Admitting: Specialist

## 2021-08-23 ENCOUNTER — Encounter: Payer: Self-pay | Admitting: Physical Medicine and Rehabilitation

## 2021-08-23 NOTE — Telephone Encounter (Signed)
I called and spoke with patient, he was asking if we had completed the paperwork that was sent over, I advised that it was sent in on 08/15/21

## 2021-08-23 NOTE — Telephone Encounter (Signed)
Pt called and needs to talk to christy about the paper for his financial assistance   CB [phone]

## 2021-08-25 ENCOUNTER — Other Ambulatory Visit: Payer: Self-pay | Admitting: Specialist

## 2021-08-25 MED ORDER — DOCUSATE SODIUM 100 MG PO CAPS: ORAL_CAPSULE | ORAL | 3 refills | Status: DC

## 2021-08-25 MED ORDER — MORPHINE SULFATE ER 15 MG PO T12A: 1.0000 | EXTENDED_RELEASE_TABLET | Freq: Two times a day (BID) | ORAL | 0 refills | Status: DC

## 2021-08-25 MED ORDER — DULCOLAX 5 MG PO TBEC: 5.0000 mg | DELAYED_RELEASE_TABLET | Freq: Every day | ORAL | 0 refills | Status: DC | PRN

## 2021-08-29 ENCOUNTER — Ambulatory Visit: Payer: Self-pay

## 2021-08-29 ENCOUNTER — Other Ambulatory Visit: Payer: Self-pay

## 2021-08-29 ENCOUNTER — Encounter: Payer: Self-pay | Admitting: Specialist

## 2021-08-29 ENCOUNTER — Ambulatory Visit (INDEPENDENT_AMBULATORY_CARE_PROVIDER_SITE_OTHER): Payer: 59 | Admitting: Specialist

## 2021-08-29 VITALS — BP 133/87 | HR 101 | Ht 69.0 in | Wt 183.0 lb

## 2021-08-29 DIAGNOSIS — Z981 Arthrodesis status: Secondary | ICD-10-CM

## 2021-08-29 DIAGNOSIS — S14105D Unspecified injury at C5 level of cervical spinal cord, subsequent encounter: Secondary | ICD-10-CM

## 2021-08-29 MED ORDER — PREGABALIN 150 MG PO CAPS: 150.0000 mg | ORAL_CAPSULE | Freq: Every day | ORAL | 3 refills | Status: DC
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
## 2021-08-31 ENCOUNTER — Other Ambulatory Visit: Payer: Self-pay | Admitting: Specialist

## 2021-08-31 MED ORDER — MORPHINE SULFATE ER 15 MG PO T12A: 1.0000 | EXTENDED_RELEASE_TABLET | Freq: Two times a day (BID) | ORAL | 0 refills | Status: AC

## 2021-08-31 MED ORDER — OXYCODONE HCL 5 MG PO TABS: 5.0000 mg | ORAL_TABLET | ORAL | 0 refills | Status: DC | PRN

## 2021-09-08 ENCOUNTER — Other Ambulatory Visit: Payer: Self-pay | Admitting: Specialist

## 2021-09-08 MED ORDER — OXYCODONE HCL 5 MG PO TABS: 5.0000 mg | ORAL_TABLET | ORAL | 0 refills | Status: DC | PRN

## 2021-09-08 MED ORDER — MORPHINE SULFATE ER 15 MG PO TBCR: 15.0000 mg | EXTENDED_RELEASE_TABLET | Freq: Two times a day (BID) | ORAL | 0 refills | Status: DC

## 2021-09-10 ENCOUNTER — Telehealth: Payer: Self-pay

## 2021-09-10 NOTE — Telephone Encounter (Signed)
[facility] called stating that patient's wife would like to know if medications could be written for more than a 7 day supply to prevent from going to the pharmacy so much?  CB# for pharmacy is [phone].  Patient's CB# [phone].  Please advise.  Thank you.

## 2021-09-10 NOTE — Telephone Encounter (Signed)
I called and advised that it is state law that he can only write a 7 day supply with his license, however once he gets into pain management they can do that for him. He states that he understands this now.

## 2021-09-14 ENCOUNTER — Other Ambulatory Visit: Payer: Self-pay | Admitting: Specialist

## 2021-09-14 MED ORDER — MORPHINE SULFATE ER 15 MG PO TBCR: 15.0000 mg | EXTENDED_RELEASE_TABLET | Freq: Two times a day (BID) | ORAL | 0 refills | Status: DC

## 2021-09-14 MED ORDER — OXYCODONE HCL 5 MG PO TABS: 5.0000 mg | ORAL_TABLET | ORAL | 0 refills | Status: DC | PRN

## 2021-09-21 ENCOUNTER — Other Ambulatory Visit: Payer: Self-pay | Admitting: Specialist

## 2021-09-21 MED ORDER — OXYCODONE HCL 5 MG PO TABS: 5.0000 mg | ORAL_TABLET | ORAL | 0 refills | Status: DC | PRN

## 2021-09-21 MED ORDER — MORPHINE SULFATE ER 15 MG PO TBCR: 15.0000 mg | EXTENDED_RELEASE_TABLET | Freq: Two times a day (BID) | ORAL | 0 refills | Status: DC

## 2021-09-25 ENCOUNTER — Other Ambulatory Visit: Payer: Self-pay

## 2021-09-25 DIAGNOSIS — F419 Anxiety disorder, unspecified: Secondary | ICD-10-CM | POA: Insufficient documentation

## 2021-09-25 DIAGNOSIS — R519 Headache, unspecified: Secondary | ICD-10-CM | POA: Insufficient documentation

## 2021-09-25 DIAGNOSIS — J189 Pneumonia, unspecified organism: Secondary | ICD-10-CM | POA: Insufficient documentation

## 2021-09-25 DIAGNOSIS — F32A Depression, unspecified: Secondary | ICD-10-CM | POA: Insufficient documentation

## 2021-09-25 DIAGNOSIS — I10 Essential (primary) hypertension: Secondary | ICD-10-CM | POA: Insufficient documentation

## 2021-09-25 DIAGNOSIS — J45909 Unspecified asthma, uncomplicated: Secondary | ICD-10-CM | POA: Insufficient documentation

## 2021-09-25 DIAGNOSIS — K219 Gastro-esophageal reflux disease without esophagitis: Secondary | ICD-10-CM | POA: Insufficient documentation

## 2021-09-26 ENCOUNTER — Encounter: Payer: 59 | Admitting: Specialist

## 2021-09-28 ENCOUNTER — Telehealth: Payer: Self-pay | Admitting: Specialist

## 2021-09-28 ENCOUNTER — Encounter: Payer: 59 | Admitting: Specialist

## 2021-09-28 ENCOUNTER — Other Ambulatory Visit: Payer: Self-pay | Admitting: Specialist

## 2021-09-28 MED ORDER — MORPHINE SULFATE ER 15 MG PO TBCR: 15.0000 mg | EXTENDED_RELEASE_TABLET | Freq: Two times a day (BID) | ORAL | 0 refills | Status: AC

## 2021-09-28 MED ORDER — OXYCODONE HCL 5 MG PO TABS: 5.0000 mg | ORAL_TABLET | ORAL | 0 refills | Status: DC | PRN
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
## 2021-10-02 ENCOUNTER — Telehealth: Payer: Self-pay | Admitting: Specialist

## 2021-10-02 NOTE — Telephone Encounter (Signed)
Patient called. He would like Christy to call him. [phone]

## 2021-10-03 NOTE — Telephone Encounter (Signed)
I called and spoke with patient and his wife, they states that the forms that we completed by Dr. Otelia Sergeant were not completed correctly --one spot was marked no and another spot marked yes for the same questions. I printed it out and I advised that I would have to speak with Dr. Otelia Sergeant about it once he returns next week.

## 2021-10-04 ENCOUNTER — Encounter: Payer: Self-pay | Admitting: Cardiology

## 2021-10-04 ENCOUNTER — Ambulatory Visit (INDEPENDENT_AMBULATORY_CARE_PROVIDER_SITE_OTHER): Payer: 59 | Admitting: Cardiology

## 2021-10-04 ENCOUNTER — Other Ambulatory Visit: Payer: Self-pay

## 2021-10-04 VITALS — BP 120/86 | HR 107 | Ht 69.0 in | Wt 188.2 lb

## 2021-10-04 DIAGNOSIS — I10 Essential (primary) hypertension: Secondary | ICD-10-CM | POA: Diagnosis not present

## 2021-10-04 DIAGNOSIS — R072 Precordial pain: Secondary | ICD-10-CM

## 2021-10-04 DIAGNOSIS — R079 Chest pain, unspecified: Secondary | ICD-10-CM

## 2021-10-04 DIAGNOSIS — F17210 Nicotine dependence, cigarettes, uncomplicated: Secondary | ICD-10-CM

## 2021-10-04 DIAGNOSIS — Z981 Arthrodesis status: Secondary | ICD-10-CM | POA: Diagnosis not present

## 2021-10-04 DIAGNOSIS — R002 Palpitations: Secondary | ICD-10-CM

## 2021-10-04 HISTORY — DX: Palpitations: R00.2

## 2021-10-04 HISTORY — DX: Chest pain, unspecified: R07.9

## 2021-10-04 HISTORY — DX: Nicotine dependence, cigarettes, uncomplicated: F17.210

## 2021-10-04 MED ORDER — METOPROLOL SUCCINATE ER 25 MG PO TB24: 25.0000 mg | ORAL_TABLET | Freq: Every day | ORAL | 6 refills | Status: DC | PRN

## 2021-10-04 NOTE — Patient Instructions (Addendum)
Medication Instructions:  Your physician has recommended you make the following change in your medication:   Take Metoprolol tartrate 25 mg as needed for palpations.  *If you need a refill on your cardiac medications before your next appointment, please call your pharmacy*   Lab Work: None ordered If you have labs (blood work) drawn today and your tests are completely normal, you will receive your results only by: MyChart Message (if you have MyChart) OR A paper copy in the mail If you have any lab test that is abnormal or we need to change your treatment, we will call you to review the results.   Testing/Procedures: Your physician has requested that you have a lexiscan myoview. For further information please visit [email or web address] Please follow instruction sheet, as given.  The test will take approximately 3 to 4 hours to complete; you may bring reading material.  If someone comes with you to your appointment, they will need to remain in the main lobby due to limited space in the testing area.   How to prepare for your Myocardial Perfusion Test: Do not eat or drink 3 hours prior to your test, except you may have water. Do not consume products containing caffeine (regular or decaffeinated) 12 hours prior to your test. (ex: coffee, chocolate, sodas, tea). Do bring a list of your current medications with you.  If not listed below, you may take your medications as normal. Do wear comfortable clothes (no dresses or overalls) and walking shoes, tennis shoes preferred (No heels or open toe shoes are allowed). Do NOT wear cologne, perfume, aftershave, or lotions (deodorant is allowed). If these instructions are not followed, your test will have to be rescheduled.    Follow-Up: At [facility], you and your health needs are our priority.  As part of our continuing mission to provide you with exceptional heart care, we have created designated Provider Care Teams.  These Care Teams  include your primary Cardiologist (physician) and Advanced Practice Providers (APPs -  Physician Assistants and Nurse Practitioners) who all work together to provide you with the care you need, when you need it.  We recommend signing up for the patient portal called "MyChart".  Sign up information is provided on this After Visit Summary.  MyChart is used to connect with patients for Virtual Visits (Telemedicine).  Patients are able to view lab/test results, encounter notes, upcoming appointments, etc.  Non-urgent messages can be sent to your provider as well.   To learn more about what you can do with MyChart, go to [email or web address].    Your next appointment:   6 month(s)  The format for your next appointment:   In Person  Provider:   Belva Crome, MD   Other Instructions Cardiac Nuclear Scan A cardiac nuclear scan is a test that is done to check the flow of blood to your heart. It is done when you are resting and when you are exercising. The test looks for problems such as: Not enough blood reaching a portion of the heart. The heart muscle not working as it should. You may need this test if: You have heart disease. You have had lab results that are not normal. You have had heart surgery or a balloon procedure to open up blocked arteries (angioplasty). You have chest pain. You have shortness of breath. In this test, a special dye (tracer) is put into your bloodstream. The tracer will travel to your heart. A camera will then take pictures of your heart to see  how the tracer moves through your heart. This test is usually done at a hospital and takes 2-4 hours. Tell a doctor about: Any allergies you have. All medicines you are taking, including vitamins, herbs, eye drops, creams, and over-the-counter medicines. Any problems you or family members have had with anesthetic medicines. Any blood disorders you have. Any surgeries you have had. Any medical conditions you  have. Whether you are pregnant or may be pregnant. What are the risks? Generally, this is a safe test. However, problems may occur, such as: Serious chest pain and heart attack. This is only a risk if the stress portion of the test is done. Rapid heartbeat. A feeling of warmth in your chest. This feeling usually does not last long. Allergic reaction to the tracer. What happens before the test? Ask your doctor about changing or stopping your normal medicines. This is important. Follow instructions from your doctor about what you cannot eat or drink. Remove your jewelry on the day of the test. What happens during the test? An IV tube will be inserted into one of your veins. Your doctor will give you a small amount of tracer through the IV tube. You will wait for 20-40 minutes while the tracer moves through your bloodstream. Your heart will be monitored with an electrocardiogram (ECG). You will lie down on an exam table. Pictures of your heart will be taken for about 15-20 minutes. You may also have a stress test. For this test, one of these things may be done: You will be asked to exercise on a treadmill or a stationary bike. You will be given medicines that will make your heart work harder. This is done if you are unable to exercise. When blood flow to your heart has peaked, a tracer will again be given through the IV tube. After 20-40 minutes, you will get back on the exam table. More pictures will be taken of your heart. Depending on the tracer that is used, more pictures may need to be taken 3-4 hours later. Your IV tube will be removed when the test is over. The test may vary among doctors and hospitals. What happens after the test? Ask your doctor: Whether you can return to your normal schedule, including diet, activities, and medicines. Whether you should drink more fluids. This will help to remove the tracer from your body. Drink enough fluid to keep your pee (urine) pale  yellow. Ask your doctor, or the department that is doing the test: When will my results be ready? How will I get my results? Summary A cardiac nuclear scan is a test that is done to check the flow of blood to your heart. Tell your doctor whether you are pregnant or may be pregnant. Before the test, ask your doctor about changing or stopping your normal medicines. This is important. Ask your doctor whether you can return to your normal activities. You may be asked to drink more fluids. This information is not intended to replace advice given to you by your health care provider. Make sure you discuss any questions you have with your health care provider. Document Revised: 04/07/2019 Document Reviewed: 06/01/2018 Elsevier Patient Education  2021 Elsevier Inc.    

## 2021-10-04 NOTE — Progress Notes (Signed)
Cardiology Office Note:    Date:  10/04/2021   ID:  Victor Huang, DOB Mar 16, 1976, MRN [id number]  PCP:  Dema Severin, NP  Cardiologist:  Garwin Brothers, MD   Referring MD: Dema Severin, NP    ASSESSMENT:    1. Essential (primary) hypertension   2. Status post cervical spinal fusion   3. Chest pain of uncertain etiology   4. Palpitations   5. Cigarette smoker    PLAN:    In order of problems listed above:  Primary prevention stressed with the patient.  Importance of compliance with diet medication stressed any vocalized understanding. Essential hypertension: Blood pressure stable and diet is emphasized.  It took some of his blood pressure issues at times related to his pain. Cigarette smoker: I spent 5 minutes with the patient discussing solely about smoking. Smoking cessation was counseled. I suggested to the patient also different medications and pharmacological interventions. Patient is keen to try stopping on its own at this time. He will get back to me if he needs any further assistance in this matter. Palpitations: I reviewed monitoring and it was unremarkable.  I will obtain TSH to see if there is any issue there.  I will also prescribe metoprolol tartrate 25 mg to be used on a as needed basis for palpitations.  Benefits risks explained and he understood.  I reviewed monitor report extensively and discussed the report and records from primary care with the patient at length. Chest pain atypical for coronary etiology however in view of risk factors we will do a Lexiscan sestamibi.  He knows to go to the nearest emergency room for any concerning symptoms. Patient will be seen in follow-up appointment in 6 months or earlier if the patient has any concerns    Medication Adjustments/Labs and Tests Ordered: Current medicines are reviewed at length with the patient today.  Concerns regarding medicines are outlined above.  No orders of the defined types were placed in this  encounter.  No orders of the defined types were placed in this encounter.    History of Present Illness:    Victor Huang is a 45 y.o. male who is being seen today for the evaluation of chest pain and palpitations at the request of York, Regina F, NP.  Patient is a pleasant 45 year old male.  He has past medical history of recently diagnosed hypertension.  He underwent cervical fusion surgery and mentions to me that he has been experiencing significant pain with neck movement after the surgery.  Patient mentions to me that he has pain in the neck and when that happens he has palpitations which lasted quite some time.  No orthopnea or PND.  He underwent monitoring with primary care and that was unrevealing. 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    Pneumonia    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/2021   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   Status post cervical spinal fusion 06/18/2021    Past Surgical History:  Procedure Laterality Date   BACK SURGERY     POSTERIOR CERVICAL FUSION/FORAMINOTOMY N/A 06/18/2021   Procedure: POSTERIOR CERVICAL FUSION CERVICAL FIVE THROUGH SIX WITH TRIPLE WIRE TECHNIQUE FIXATION  AND RIGHT ILIAC CREST BONE GRAFT HARVEST, LEFT CERVIACL SIX THROUGH SEVEN CERVICAL FORAMINAL LAMINOPLASTY;  Surgeon: Kerrin Champagne, MD;  Location: [facility] OR;  Service: Orthopedics;  Laterality: N/A;    Current Medications: Current Meds  Medication Sig   amitriptyline (ELAVIL) 75 MG tablet Take 1 tablet (75 mg total) by mouth at bedtime.   bisacodyl (DULCOLAX) 5 MG EC tablet Take 1 tablet (5 mg total) by mouth daily as needed for moderate constipation.   LINZESS 145 MCG CAPS capsule Take 145 mcg by  mouth daily as needed for constipation.   morphine (MS CONTIN) 15 MG 12 hr tablet Take 1 tablet (15 mg total) by mouth every 12 (twelve) hours for 7 days.   oxyCODONE (ROXICODONE) 5 MG immediate release tablet Take 1 tablet (5 mg total) by mouth every 4 (four) hours as needed.   pregabalin (LYRICA) 75 MG capsule Take 75 mg by mouth daily.   tiZANidine (ZANAFLEX) 4 MG tablet Take 1 tablet (4 mg total) by mouth every 6 (six) hours.     Allergies:   Ibuprofen   Social History   Socioeconomic History   Marital status: Married    Spouse name: Not on file   Number of children: Not on file   Years of education: Not on file   Highest education level: Not on file  Occupational History   Not on file  Tobacco Use   Smoking status: Every Day    Packs/day: 1.00    Types: Cigarettes    Start date: 01/01/1987   Smokeless tobacco: Never  Vaping Use   Vaping Use: Never used  Substance and Sexual Activity   Alcohol use: Never    Comment: no beer in over 15 years   Drug use: Not Currently   Sexual activity: Yes  Other Topics Concern   Not on file  Social History Narrative   Not on file   Social Determinants of Health   Financial Resource Strain: Not on file  Food Insecurity: Not on file  Transportation Needs: Not on file  Physical Activity: Not on file  Stress: Not on file  Social Connections: Not on file     Family History: The patient's family history includes Diabetes in his mother; Hypertension in his mother.  ROS:   Please see the history of present illness.    All other systems reviewed and are negative.  EKGs/Labs/Other Studies Reviewed:    The following studies were reviewed today: EKG reveals sinus rhythm and nonspecific ST changes   Recent Labs: 06/14/2021: ALT 32 07/12/2021: BUN 20; Creatinine, Ser 0.76; Hemoglobin 14.1; Platelets 425; Potassium 4.5; Sodium 138  Recent Lipid Panel No results found for: CHOL, TRIG, HDL, CHOLHDL, VLDL, LDLCALC, LDLDIRECT  Physical  Exam:    VS:  BP 120/86   Pulse (!) 107   Ht 5\' 9"  (1.753 m)   Wt 188 lb 3.2 oz (85.4 kg)   SpO2 97%   BMI 27.79 kg/m     Wt Readings from Last 3 Encounters:  10/04/21 188 lb 3.2 oz (85.4 kg)  08/29/21 183 lb (83 kg)  08/08/21 183 lb (83 kg)     GEN: Patient is in no acute distress HEENT: Normal NECK: No JVD; No carotid bruits LYMPHATICS: No lymphadenopathy CARDIAC: S1 S2 regular, 2/6 systolic murmur at the apex. RESPIRATORY:  Clear to auscultation without rales, wheezing or rhonchi  ABDOMEN: Soft, non-tender, non-distended MUSCULOSKELETAL:  No edema; No deformity  SKIN: Warm and dry NEUROLOGIC:  Alert and  oriented x 3 PSYCHIATRIC:  Normal affect    Signed, Garwin Brothers, MD  10/04/2021 4:05 PM    [facility]

## 2021-10-05 LAB — TSH: TSH: 1.57 u[IU]/mL (ref 0.450–4.500)

## 2021-10-06 ENCOUNTER — Other Ambulatory Visit: Payer: Self-pay | Admitting: Orthopaedic Surgery

## 2021-10-06 MED ORDER — PREGABALIN 50 MG PO CAPS: ORAL_CAPSULE | ORAL | 1 refills | Status: DC

## 2021-10-06 MED ORDER — MORPHINE SULFATE ER 15 MG PO TBEA: 15.0000 | EXTENDED_RELEASE_TABLET | Freq: Two times a day (BID) | ORAL | 0 refills | Status: DC | PRN

## 2021-10-06 MED ORDER — OXYCODONE HCL 5 MG PO TABS: 5.0000 mg | ORAL_TABLET | ORAL | 0 refills | Status: DC | PRN

## 2021-10-06 NOTE — Progress Notes (Signed)
Refills sent in

## 2021-10-09 NOTE — Addendum Note (Signed)
Addended by: Eleonore Chiquito on: 10/09/2021 03:05 PM   Modules accepted: Orders

## 2021-10-09 NOTE — Addendum Note (Signed)
Addended by: Trinetta Alemu R on: 10/09/2021 03:06 PM   Modules accepted: Orders  

## 2021-10-09 NOTE — Telephone Encounter (Signed)
10/11/21 @1015 

## 2021-10-11 ENCOUNTER — Other Ambulatory Visit: Payer: Self-pay

## 2021-10-11 ENCOUNTER — Ambulatory Visit (INDEPENDENT_AMBULATORY_CARE_PROVIDER_SITE_OTHER): Payer: 59 | Admitting: Specialist

## 2021-10-11 ENCOUNTER — Encounter: Payer: Self-pay | Admitting: Specialist

## 2021-10-11 ENCOUNTER — Ambulatory Visit: Payer: Self-pay

## 2021-10-11 VITALS — BP 127/89 | HR 91 | Ht 69.0 in | Wt 188.0 lb

## 2021-10-11 DIAGNOSIS — S14155A Other incomplete lesion at C5 level of cervical spinal cord, initial encounter: Secondary | ICD-10-CM | POA: Diagnosis not present

## 2021-10-11 DIAGNOSIS — Z981 Arthrodesis status: Secondary | ICD-10-CM | POA: Diagnosis not present

## 2021-10-11 DIAGNOSIS — M7918 Myalgia, other site: Secondary | ICD-10-CM

## 2021-10-11 DIAGNOSIS — S14105D Unspecified injury at C5 level of cervical spinal cord, subsequent encounter: Secondary | ICD-10-CM

## 2021-10-11 DIAGNOSIS — G9589 Other specified diseases of spinal cord: Secondary | ICD-10-CM
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
## 2021-10-11 NOTE — Progress Notes (Signed)
Office Visit Note   Patient: Victor Huang           Date of Birth: June 12, 1976           MRN: [id number] Visit Date: 10/11/2021              Requested by: Dema Severin, NP [address] [place],  [place] [address] PCP: Dema Severin, NP   Assessment & Plan: Visit Diagnoses:  1. Spinal cord injury at C5-C7 level without injury of spinal bone, subsequent encounter (HCC)   2. S/P cervical spinal fusion     Plan: Avoid overhead lifting and overhead use of the arms. Do not lift greater than 5 lbs. Adjust head rest in vehicle to prevent hyperextension if rear ended. Take extra precautions to avoid falling.your current symptoms or signs.   Follow-Up Instructions: No follow-ups on file.   Orders:  Orders Placed This Encounter  Procedures  . XR Cervical Spine 2 or 3 views   No orders of the defined types were placed in this encounter.     Procedures: No procedures performed   Clinical Data: No additional findings.   Subjective: Chief Complaint  Patient presents with  . Neck - Follow-up    45 year old right handed male post posterior wiring and fusion C5-6 with left C6-7 and C7-T1 foramenotomies.  Complaints of pain with full stomach, straining at bathroom with painful. He has spasms in the neck and pain that is like a needle poking him over the right posterior neck with certain movements. He has some constipation, straining is irritating. Take linzess and dulcolax and miralax. He is to see Dr. Anne Hahn tomorrow and [facility] for evaluation by the spine rehab center and pain management. Still requires narcotic for pain control and he is limited to lying down much of the time and is gaining weight.    Review of Systems  Constitutional: Negative.   HENT: Negative.    Eyes: Negative.   Respiratory: Negative.    Cardiovascular: Negative.   Gastrointestinal: Negative.   Endocrine: Negative.   Genitourinary: Negative.   Musculoskeletal: Negative.   Skin: Negative.    Allergic/Immunologic: Negative.   Neurological: Negative.   Hematological: Negative.   Psychiatric/Behavioral: Negative.      Objective: Vital Signs: BP 127/89 (BP Location: Left Arm, Patient Position: Sitting)   Pulse 91   Ht 5\' 9"  (1.753 m)   Wt 188 lb (85.3 kg)   BMI 27.76 kg/m   Physical Exam Constitutional:      Appearance: He is well-developed.  HENT:     Head: Normocephalic and atraumatic.  Eyes:     Pupils: Pupils are equal, round, and reactive to light.  Pulmonary:     Effort: Pulmonary effort is normal.     Breath sounds: Normal breath sounds.  Abdominal:     General: Bowel sounds are normal.     Palpations: Abdomen is soft.  Musculoskeletal:     Cervical back: Normal range of motion and neck supple.     Lumbar back: Negative right straight leg raise test.  Skin:    General: Skin is warm and dry.  Neurological:     Mental Status: He is alert and oriented to person, place, and time.  Psychiatric:        Behavior: Behavior normal.        Thought Content: Thought content normal.        Judgment: Judgment normal.   Back Exam  Tenderness  The patient is experiencing tenderness in the cervical.  Range of Motion  Extension:  abnormal  Flexion:  abnormal  Lateral bend right:  normal  Lateral bend left:  normal  Rotation right:  normal  Rotation left:  normal   Muscle Strength  Right Quadriceps:  5/5  Left Quadriceps:  5/5  Right Hamstrings:  5/5  Left Hamstrings:  5/5   Tests  Straight leg raise right: negative  Other  Toe walk: normal Heel walk: normal Gait: normal  Erythema: no back redness Scars: present    Specialty Comments:  No specialty comments available.  Imaging: No results found.   PMFS History: Patient Active Problem List   Diagnosis Date Noted  . 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/2021    Priority: 1.    Class: Chronic  . Other spondylosis with radiculopathy, cervical region 06/18/2021    Priority: 1.     Class: Chronic  . Chest pain of uncertain etiology 10/04/2021  . Palpitations 10/04/2021  . Cigarette smoker 10/04/2021  . Anxiety disorder 09/25/2021  . Asthma 09/25/2021  . Depression 09/25/2021  . Essential (primary) hypertension 09/25/2021  . GERD (gastroesophageal reflux disease) 09/25/2021  . Headache 09/25/2021  . Pneumonia 09/25/2021  . Cervical myelopathy (HCC) 06/22/2021  . Status post cervical spinal fusion 06/18/2021   Past Medical History:  Diagnosis Date  . Anxiety disorder   . Asthma    as a child  . Cervical myelopathy (HCC) 06/22/2021  . Depression   . Essential (primary) hypertension   . GERD (gastroesophageal reflux disease)   . Headache   . Other spondylosis with radiculopathy, cervical region 06/18/2021   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   . Pneumonia   . 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/2021   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  . Status post cervical spinal fusion 06/18/2021    Family History  Problem Relation Age of Onset  . Hypertension Mother   . Diabetes Mother     Past Surgical History:  Procedure Laterality Date  . BACK SURGERY    . POSTERIOR CERVICAL FUSION/FORAMINOTOMY N/A 06/18/2021   Procedure: POSTERIOR CERVICAL FUSION CERVICAL FIVE THROUGH SIX WITH TRIPLE WIRE TECHNIQUE FIXATION AND RIGHT ILIAC CREST BONE GRAFT HARVEST, LEFT CERVIACL SIX THROUGH SEVEN CERVICAL FORAMINAL LAMINOPLASTY;  Surgeon: Kerrin Champagne, MD;  Location: [facility] OR;  Service: Orthopedics;  Laterality: N/A;   Social History   Occupational History  . Not on file  Tobacco Use  . Smoking status: Every Day    Packs/day: 1.00    Types: Cigarettes    Start date: 01/01/1987  . Smokeless tobacco: Never  Vaping Use  . Vaping Use: Never used  Substance and Sexual Activity  . Alcohol use: Never    Comment: no beer in over 15 years  . Drug use: Not Currently  . Sexual activity: Yes

## 2021-10-12 ENCOUNTER — Encounter: Payer: Self-pay | Admitting: Neurology

## 2021-10-12 ENCOUNTER — Other Ambulatory Visit: Payer: Self-pay | Admitting: Specialist

## 2021-10-12 ENCOUNTER — Ambulatory Visit (INDEPENDENT_AMBULATORY_CARE_PROVIDER_SITE_OTHER): Payer: 59 | Admitting: Neurology

## 2021-10-12 VITALS — BP 124/85 | HR 91 | Ht 69.0 in | Wt 191.0 lb

## 2021-10-12 DIAGNOSIS — G959 Disease of spinal cord, unspecified: Secondary | ICD-10-CM | POA: Diagnosis not present

## 2021-10-12 MED ORDER — OXYCODONE HCL 5 MG PO TABS: 5.0000 mg | ORAL_TABLET | ORAL | 0 refills | Status: DC | PRN

## 2021-10-12 MED ORDER — MORPHINE SULFATE ER 15 MG PO TBEA: 15.0000 | EXTENDED_RELEASE_TABLET | Freq: Two times a day (BID) | ORAL | 0 refills | Status: DC | PRN

## 2021-10-12 MED ORDER — BACLOFEN 10 MG PO TABS: ORAL_TABLET | ORAL | 2 refills | Status: DC

## 2021-10-12 NOTE — Progress Notes (Signed)
Reason for visit: Cervical myelopathy  Victor Huang is an 45 y.o. male  History of present illness:  Victor Huang is a 45 year old right-handed Hispanic male with a history of a cervical myelopathy.  The patient underwent cervical spine surgery on 18 June 2021 and unfortunately had a complication with severe edema and hemorrhage into the spinal cord following the surgery.  The hemorrhage occurred around the C5 level.  A repeat MRI in July 2022 showed significant improvement in the spinal cord edema, with residual lesion in the lateral cord at the C5 level.  The patient has been left with severe neck stiffness and decreased mobility.  He has numbness of the right hand and neuropathic discomfort in the upper arm on the right with paresthesias and lancinating pains.  He occasionally may have some troubles on the left side as well but the right arm is the primary issue.  If he flexes his head, he may have some sensory alteration down into the legs.  The patient is also having episodes where he will have a buildup of pressure in the neck with onset of increased heart rate into the 135 range.  The patient may have flushing of the face and increased discomfort down the right arm with these events.  The events may be brought on by prolonged sitting or standing, he feels better when he is lying down.  Eating a large meal may also bring on an episode.  The patient has been seen through cardiology, he will be set up for a chemical stress test.  The patient does get some benefit from the tizanidine.  He is on amitriptyline chronically for sleep, he runs an elevated heart rate because of this.  He has recently been set up for physical therapy, this will be done in the near future.  He is on Lyrica and also takes opiate medications on a regular basis without complete control of the pain issue.  The patient returns to the office today for an evaluation.  He denies issues controlling the bladder, he has chronic  constipation on the medications.  He denies any balance issues or falls.  He has good strength in both arms.  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    Pneumonia    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/2021   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   Status post cervical spinal fusion 06/18/2021    Past Surgical History:  Procedure Laterality Date   BACK SURGERY     POSTERIOR CERVICAL FUSION/FORAMINOTOMY N/A 06/18/2021   Procedure: POSTERIOR CERVICAL FUSION CERVICAL FIVE THROUGH SIX WITH TRIPLE WIRE TECHNIQUE FIXATION AND RIGHT ILIAC CREST BONE GRAFT HARVEST, LEFT CERVIACL SIX THROUGH SEVEN CERVICAL FORAMINAL LAMINOPLASTY;  Surgeon: Kerrin Champagne, MD;  Location: [facility] OR;  Service: Orthopedics;  Laterality: N/A;    Family History  Problem Relation Age of Onset   Hypertension Mother    Diabetes Mother     Social history:  reports that he has been smoking cigarettes. He started smoking about 34 years ago. He has been smoking an average of 1 pack per day. He has never used smokeless tobacco. He reports that he does not currently use  drugs. He reports that he does not drink alcohol.    Allergies  Allergen Reactions   Ibuprofen Rash    Medications:  Prior to Admission medications   Medication Sig Start Date End Date Taking? Authorizing Provider  amitriptyline (ELAVIL) 75 MG tablet Take 1 tablet (75 mg total) by mouth at bedtime. 06/26/21  Yes Kerrin Champagne, MD  bisacodyl (DULCOLAX) 5 MG EC tablet Take 1 tablet (5 mg total) by mouth daily as needed for moderate constipation. 08/25/21  Yes Kerrin Champagne, MD  LINZESS 145 MCG CAPS capsule Take 145 mcg by mouth daily as needed for constipation. 09/04/21  Yes  [provider]  metoprolol succinate (TOPROL XL) 25 MG 24 hr tablet Take 1 tablet (25 mg total) by mouth daily as needed (palpations). 10/04/21  Yes Revankar, Aundra Dubin, MD  Morphine Sulfate ER 15 MG TBEA Take 15 tablets by mouth every 12 (twelve) hours as needed. 10/06/21  Yes Eldred Manges, MD  oxyCODONE (ROXICODONE) 5 MG immediate release tablet Take 1 tablet (5 mg total) by mouth every 4 (four) hours as needed. 10/06/21 10/06/22 Yes Eldred Manges, MD  pregabalin (LYRICA) 50 MG capsule Take 100mg  po morning and at night  . 10/06/21  Yes 12/06/21, MD  pregabalin (LYRICA) 75 MG capsule Take 75 mg by mouth daily. 08/15/21  Yes [provider]  tiZANidine (ZANAFLEX) 4 MG tablet Take 1 tablet (4 mg total) by mouth every 6 (six) hours. 07/17/21  Yes 07/19/21, MD    ROS:  Out of a complete 14 system review of symptoms, the patient complains only of the following symptoms, and all other reviewed systems are negative.  Neck stiffness and pain Right arm numbness and discomfort Episodes of elevated heart rate  Blood pressure 124/85, pulse 91, height 5\' 9"  (1.753 m), weight 191 lb (86.6 kg).  Physical Exam  General: The patient is alert and cooperative at the time of the examination.  Neuromuscular: The patient is only able to generate 5 or 10 degrees of lateral rotation of the cervical spine bilaterally and has significant restriction of flexion-extension movements of the neck.  Skin: No significant peripheral edema is noted.   Neurologic Exam  Mental status: The patient is alert and oriented x 3 at the time of the examination. The patient has apparent normal recent and remote memory, with an apparently normal attention span and concentration ability.   Cranial nerves: Facial symmetry is present. Speech is normal, no aphasia or dysarthria is noted. Extraocular movements are full. Visual fields are full.  Motor: The patient has good strength in all 4  extremities.  Sensory examination: Soft touch sensation is decreased and altered on the entirety of the right arm, particularly on the right hand as compared to the left.  Soft touch sensation on the face and legs is symmetric.  Coordination: The patient has good finger-nose-finger and heel-to-shin bilaterally.  Gait and station: The patient has a normal gait. Tandem gait is normal. Romberg is negative. No drift is seen.  Reflexes: Deep tendon reflexes are symmetric, but are slightly increased in the arms.   MRI cervical 07/18/21:  IMPRESSION: 1. No acute abnormality of the cervical spine. 2. C4-6 ACDF without residual spinal canal stenosis. 3. Unchanged mild bilateral C5-6 neural foraminal stenosis.   * MRI scan images were reviewed online. I agree with the written report.    Assessment/Plan:  1.  Spinal cord injury, cervical myelopathy  2.  Chronic neuropathic  pain  3.  Episodes of tachycardia  The patient is undergoing a cardiology evaluation.  It is possible the patient may be having episodes of increased sympathetic outflow associated with the spinal cord injury.  The patient has been given metoprolol to take if needed.  He will be undergoing physical therapy.  He will continue the tizanidine, I will add baclofen to this beginning at 5 mg twice daily for 1 week and then go to 5 mg 3 times daily.  We will continue to increase this medication as tolerated.  He will follow-up here in 3 months, he will be seen through Dr. Epimenio Foot.  Marlan Palau MD 10/12/2021 8:34 AM  [facility] [address] [place], [place] [address]  Phone [phone] Fax [phone]

## 2021-10-15 ENCOUNTER — Telehealth: Payer: Self-pay

## 2021-10-15 MED ORDER — MORPHINE SULFATE ER 15 MG PO TBEA: 1.0000 | EXTENDED_RELEASE_TABLET | Freq: Two times a day (BID) | ORAL | 0 refills | Status: DC

## 2021-10-15 NOTE — Telephone Encounter (Signed)
Dr. Otelia Sergeant spoke with pharmacy. New rx sent in.

## 2021-10-15 NOTE — Telephone Encounter (Signed)
Mardella Layman with [facility] would like a call back to clarify RX (15 tablets) that was sent over on Friday, 10/12/2021 for patient.  Cb# [phone].  Please advise.  Thank you.

## 2021-10-15 NOTE — Addendum Note (Signed)
Addended by: [name] on: 10/15/2021 03:44 PM   Modules accepted: Orders

## 2021-10-17 ENCOUNTER — Telehealth: Payer: Self-pay | Admitting: *Deleted

## 2021-10-17 ENCOUNTER — Encounter: Payer: Self-pay | Admitting: *Deleted

## 2021-10-17 NOTE — Telephone Encounter (Signed)
Patient given detailed instructions per Myocardial Perfusion Study Information Sheet for the test on 10/24/21 at 1100. Patient notified to arrive 15 minutes early and that it is imperative to arrive on time for appointment to keep from having the test rescheduled.  If you need to cancel or reschedule your appointment, please call the office within 24 hours of your appointment. . Patient verbalized understanding.Victor Huang, Victor Huang Mychart letter sent

## 2021-10-19 ENCOUNTER — Other Ambulatory Visit: Payer: Self-pay | Admitting: Neurology

## 2021-10-19 ENCOUNTER — Other Ambulatory Visit: Payer: Self-pay | Admitting: Specialist

## 2021-10-19 MED ORDER — OXYCODONE HCL 5 MG PO TABS: 5.0000 mg | ORAL_TABLET | ORAL | 0 refills | Status: DC | PRN

## 2021-10-19 MED ORDER — MORPHINE SULFATE ER 15 MG PO TBEA: 1.0000 | EXTENDED_RELEASE_TABLET | Freq: Two times a day (BID) | ORAL | 0 refills | Status: DC

## 2021-10-24 ENCOUNTER — Telehealth: Payer: Self-pay | Admitting: Specialist

## 2021-10-24 ENCOUNTER — Ambulatory Visit (INDEPENDENT_AMBULATORY_CARE_PROVIDER_SITE_OTHER): Payer: 59

## 2021-10-24 ENCOUNTER — Other Ambulatory Visit: Payer: Self-pay

## 2021-10-24 DIAGNOSIS — R072 Precordial pain: Secondary | ICD-10-CM

## 2021-10-24 DIAGNOSIS — R079 Chest pain, unspecified: Secondary | ICD-10-CM | POA: Diagnosis not present

## 2021-10-24 LAB — MYOCARDIAL PERFUSION IMAGING
LV dias vol: 99 mL (ref 62–150)
LV sys vol: 38 mL
Nuc Stress EF: 61 %
Peak HR: 122 {beats}/min
Rest HR: 83 {beats}/min
Rest Nuclear Isotope Dose: 10.1 mCi
SDS: 3
SRS: 2
SSS: 5
Stress Nuclear Isotope Dose: 31.4 mCi
TID: 0.97

## 2021-10-24 MED ORDER — TECHNETIUM TC 99M TETROFOSMIN IV KIT
31.4000 | PACK | Freq: Once | INTRAVENOUS | Status: AC | PRN
  Administered 2021-10-24: 31.4 via INTRAVENOUS

## 2021-10-24 MED ORDER — TECHNETIUM TC 99M TETROFOSMIN IV KIT
10.1000 | PACK | Freq: Once | INTRAVENOUS | Status: AC | PRN
  Administered 2021-10-24: 10.1 via INTRAVENOUS

## 2021-10-24 MED ORDER — REGADENOSON 0.4 MG/5ML IV SOLN
0.4000 mg | Freq: Once | INTRAVENOUS | Status: AC
  Administered 2021-10-24: 0.4 mg via INTRAVENOUS

## 2021-10-24 NOTE — Telephone Encounter (Signed)
Pt called and stated they did not receive forms from Dr. Otelia Sergeant and wanted to speak to the nurse as she is the one they gave the forms to at the previous appt. The best call back number is [phone].

## 2021-10-26 ENCOUNTER — Other Ambulatory Visit: Payer: Self-pay | Admitting: Specialist

## 2021-10-26 MED ORDER — MORPHINE SULFATE ER 15 MG PO TBEA: 1.0000 | EXTENDED_RELEASE_TABLET | Freq: Two times a day (BID) | ORAL | 0 refills | Status: DC

## 2021-10-26 MED ORDER — OXYCODONE HCL 5 MG PO TABS: 5.0000 mg | ORAL_TABLET | ORAL | 0 refills | Status: DC | PRN

## 2021-10-29 NOTE — Telephone Encounter (Signed)
Holding for Christy. 

## 2021-10-30 NOTE — Telephone Encounter (Signed)
Form faxed to attention Colin Mulders, Pts wife is aware that I am faxing them in for them.

## 2021-11-02 ENCOUNTER — Encounter: Payer: 59 | Attending: Physical Medicine and Rehabilitation | Admitting: Physical Medicine and Rehabilitation

## 2021-11-02 ENCOUNTER — Encounter: Payer: Self-pay | Admitting: Physical Medicine and Rehabilitation

## 2021-11-02 ENCOUNTER — Other Ambulatory Visit: Payer: Self-pay

## 2021-11-02 ENCOUNTER — Other Ambulatory Visit: Payer: Self-pay | Admitting: Specialist

## 2021-11-02 VITALS — BP 128/88 | HR 100 | Temp 97.8°F | Ht 69.0 in | Wt 187.4 lb

## 2021-11-02 DIAGNOSIS — G8250 Quadriplegia, unspecified: Secondary | ICD-10-CM | POA: Insufficient documentation

## 2021-11-02 DIAGNOSIS — M792 Neuralgia and neuritis, unspecified: Secondary | ICD-10-CM | POA: Diagnosis present

## 2021-11-02 DIAGNOSIS — Z79891 Long term (current) use of opiate analgesic: Secondary | ICD-10-CM | POA: Insufficient documentation

## 2021-11-02 DIAGNOSIS — Z5181 Encounter for therapeutic drug level monitoring: Secondary | ICD-10-CM | POA: Insufficient documentation

## 2021-11-02 DIAGNOSIS — R252 Cramp and spasm: Secondary | ICD-10-CM

## 2021-11-02 DIAGNOSIS — G894 Chronic pain syndrome: Secondary | ICD-10-CM | POA: Insufficient documentation

## 2021-11-02 HISTORY — DX: Quadriplegia, unspecified: G82.50

## 2021-11-02 HISTORY — DX: Chronic pain syndrome: G89.4

## 2021-11-02 HISTORY — DX: Cramp and spasm: R25.2

## 2021-11-02 MED ORDER — PREGABALIN 150 MG PO CAPS: 150.0000 mg | ORAL_CAPSULE | Freq: Four times a day (QID) | ORAL | 5 refills | Status: DC

## 2021-11-02 MED ORDER — TIZANIDINE HCL 4 MG PO TABS
6.0000 mg | ORAL_TABLET | Freq: Four times a day (QID) | ORAL | 5 refills | Status: DC
Start: 2021-11-02 — End: 2022-04-01

## 2021-11-02 MED ORDER — OXYCODONE HCL 5 MG PO TABS: 5.0000 mg | ORAL_TABLET | ORAL | 0 refills | Status: DC | PRN

## 2021-11-02 MED ORDER — MORPHINE SULFATE ER 15 MG PO TBEA
1.0000 | EXTENDED_RELEASE_TABLET | Freq: Two times a day (BID) | ORAL | 0 refills | Status: DC
Start: 2021-11-02 — End: 2021-11-09

## 2021-11-02 NOTE — Patient Instructions (Addendum)
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  He now has chronic nerve and chronic pain since  surgery as well. 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  Is a C2 R hemi/SCI/quadriplegia- ASIA D.   Is here for evaluation of SCI.   Bowels- just got Linzess increased to 290 mcg/day- I suggest trying Senokot/Senna- 1-2 tabs 2x/day- can max out Miralax 2 doses/day. Can spread out if wants smaller multiple BM's.    2. Nerve pain- Will increase Lyrica to 150 mg 3x/day-  x 1 week, THEN increase to 150 mg 4x/day- if cannot get the 4th dose in- do 150/150/300 mg for the 3x/day.  - call me in 3-4 weeks and we can discuss adding something else like Cymbalta- Would not interact with Amitriptyline.   3. Dysautonomia- there is a cardiologist upstairs that helps with that. 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 Goes away when fixes the painful stimulus.   4. Chronic pain- Will get UDS/opiate contract- And can take over MS Contin 15 mg BID and Oxycodone 5 mg 4x/day as needed- will right once get UDS back.   5. Can pre-treat for pain- don't wait if you know when it's going to hit!  6.  Spasticity- Went over options of increasing Tizandine vs adding Dantrolene. Will call me in 1 month, and could add Dantrolene at that time.   7. Spasticity- Will increase Tizandine to 6 mg 4x/day x 1 week, then if need be, can increase to 8 mg 4x/day. If has too much sedation, then call me and let me know.    8.  Will wait on PT for now- and do once pain is more controlled.    9.  Went over prognosis- has 1 year ot hit maximal improvement.    10. F/U double visit- in 3 months/SCI  11. Might need to send to GI for stomach issues- will see at next appt.   12. 12. Suggest that metoprolol be tiniest dose and short acting for autonomic  symptoms- ue to severe tachycardia when these episodes occur. .  Sometimes I give sublingual Nitroglycerin for AD symptoms- autonomic dysreflexia. If BP gets too high-

## 2021-11-02 NOTE — Progress Notes (Addendum)
Subjective:    Patient ID: Victor Huang, male    DOB: 07-09-1976, 45 y.o.   MRN: [id number]  HPI  Pt is a 45 yr old male with hx of incomplete quadriplegia and s/p C5/7 posterior cervical fusion by Dr Otelia Sergeant- 06/18/21.  He now has chronic nerve and chronic pain since  surgery as well.    Is here for evaluation of SCI.   Had cervical fusion due to pain on L side.  Also hx of anterior cervical fusion 2019.  On Lyrica 100 in AM, and mid day 150 mg and at night 100 mg- from Dr Otelia Sergeant.  MS contin 15 mg BID and Oxycodone 5 mg q 4 hours prn- takes ~ 4x/day.   Describes pain as electrical and stabbing.   Also has muscle spasms- has jerking muscle spasms- and then also neck with tighten up and "lock up".  Has episodes of both-   Bowels- is constipated-   Also whenever eats something, and stomach gets even a little full- then has spasm episode and gets "heart going".   On Linzess and Dulcolax BID and Miralax qday. Just increased Linzess to 290 mcg daily last week.   Bladder- going normally like was before surgery.    Feels like meds are helping a lot- and knows what occurs if he doesn't take on time- it takes 2-3 days to get under control again.  NO side effects from Lyrica- has had intermittent Swelling but elevation helps.  Taking Zanaflex 4 mg 3-4x/day.  Usually 3x during day and 1 at night.   Tried: Gabapentin- wasn't helping as much Robaxin- took off and put on Zanaflex.  Tried baclofen- made his BP/HR go too low and couldn't sleep for 2 days. Interacted with Metoprolol.  Intimacy- getting erections- but not having intimacy- the aftermath is not worth it.  Has a spasticity reaction and increase in pain.   Cannot sit or stand for long Has to sleep in recliner- not bed due to specific angle required- can stand 15-20 minutes max and then has ot lay down.  Sitting worse than standing.  Sitting up straight makes things much worse. And will trigger spasticity. And pressure  buildup in neck. Horrific pain.   Almost everything occurs on R side, but occ will get sharp pains in L side/shoulder, etc. Also with R side, mainly RUE/chest, etc, can shoot down to R toe intermittently.  Per wife, will turn white and episoe progresses- HR will go to 130s/140s and BP will go to 140s/90s at the highest then will turn bright red and R hand gets ice cold and get splotchy red patches.     Pain Inventory Average Pain 5 Pain Right Now 4 My pain is sharp, burning, dull, stabbing, tingling, and aching  LOCATION OF PAIN  neck shoulder elbow wrist hand fingers  BOWEL Number of stools per week: 3 Oral laxative use Yes  Type of laxative linzess dulcolax miralax   BLADDER Normal    Mobility walk without assistance ability to climb steps?  yes do you drive?  no  Function not employed: date last employed 06/18/21 I need assistance with the following:  dressing, bathing, meal prep, household duties, and shopping  Neuro/Psych weakness numbness tingling spasms dizziness  Prior Studies Any changes since last visit?  no  Physicians involved in your care Orthopedist Nitka   Family History  Problem Relation Age of Onset   Hypertension Mother    Diabetes Mother    Social History  Socioeconomic History   Marital status: Married    Spouse name: Not on file   Number of children: Not on file   Years of education: Not on file   Highest education level: Not on file  Occupational History   Not on file  Tobacco Use   Smoking status: Every Day    Packs/day: 1.00    Types: Cigarettes    Start date: 01/01/1987   Smokeless tobacco: Never  Vaping Use   Vaping Use: Never used  Substance and Sexual Activity   Alcohol use: Never    Comment: no beer in over 15 years   Drug use: Not Currently   Sexual activity: Yes  Other Topics Concern   Not on file  Social History Narrative   Not on file   Social Determinants of Health   Financial Resource Strain: Not on  file  Food Insecurity: Not on file  Transportation Needs: Not on file  Physical Activity: Not on file  Stress: Not on file  Social Connections: Not on file   Past Surgical History:  Procedure Laterality Date   BACK SURGERY     POSTERIOR CERVICAL FUSION/FORAMINOTOMY N/A 06/18/2021   Procedure: POSTERIOR CERVICAL FUSION CERVICAL FIVE THROUGH SIX WITH TRIPLE WIRE TECHNIQUE FIXATION AND RIGHT ILIAC CREST BONE GRAFT HARVEST, LEFT CERVIACL SIX THROUGH SEVEN CERVICAL FORAMINAL LAMINOPLASTY;  Surgeon: Kerrin Champagne, MD;  Location: [facility] OR;  Service: Orthopedics;  Laterality: N/A;   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    Pneumonia    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/2021   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   Status post cervical spinal fusion 06/18/2021   BP 128/88   Pulse 100   Temp 97.8 F (36.6 C)   Ht 5\' 9"  (1.753 m)   Wt 187 lb 6.4 oz (85 kg)   SpO2 99%   BMI 27.67 kg/m   Opioid Risk Score:   Fall Risk Score:  `1  Depression screen PHQ 2/9  Depression screen PHQ 2/9 11/02/2021  Decreased Interest 0  Down, Depressed, Hopeless 3  PHQ - 2 Score 3  Altered sleeping 3  Tired, decreased energy 0  Change in appetite 0  Feeling bad or failure about yourself  0  Trouble concentrating 0  Moving slowly or fidgety/restless 0  Suicidal thoughts 0  PHQ-9 Score 6     Review of Systems  Constitutional: Negative.  Negative for appetite change.  HENT: Negative.    Eyes: Negative.   Respiratory: Negative.    Cardiovascular: Negative.   Gastrointestinal:  Positive for constipation.  Endocrine: Negative.   Genitourinary: Negative.   Musculoskeletal:  Positive for neck pain.  Skin: Negative.    Allergic/Immunologic: Negative.   Neurological:  Positive for weakness and numbness.       Tingling  Hematological: Negative.   Psychiatric/Behavioral: Negative.    All other systems reviewed and are negative.     Objective:   Physical Exam Awake, alert, has to lay at 45 degrees or so and legs elevated; accompanied by wife, NAD  MS: RUE biceps 4-/5; Triceps 4-/5; WE 4+/5; grip 5-/5 and FA 5-5 LUE- 5/5 except biceps 5-/5 RLE- 5/5 in HF/KE/DF  and PF LLE- 5/5 in same muscles   Neuro: Hoffman's brisk in RUE and less so on LUE Clonus 4-5 beats B/L LE's MAS of 1+ in elbows and shoulder on R: LUE- MAS of 1 to 1+ in elbow less so in wrists B/L  Sensation to light touch decreased at C2 on R down to T 6/7- where it becomes normal again down to S5 Sensation to light touch intact on L side.   Shaking when so tired when sitting up.        Assessment & Plan:   Pt is a 45 yr old male with hx of incomplete quadriplegia  (C2 R hemi/quadriplegia ASIA D)  and s/p C5/7 posterior cervical fusion by Dr Otelia Sergeant- 06/18/21.  He now has chronic nerve and chronic pain since  surgery as well. 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  Is a C2 R hemi/SCI/quadriplegia- ASIA D.   Is here for evaluation of SCI.   Bowels- just got Linzess increased to 290 mcg/day- I suggest trying Senokot/Senna- 1-2 tabs 2x/day- can max out Miralax 2 doses/day. Can spread out if wants smaller multiple BM's.    2. Nerve pain- Will increase Lyrica to 150 mg 3x/day-  x 1 week, THEN increase to 150 mg 4x/day- if cannot get the 4th dose in- do 150/150/300 mg for the 3x/day.  - call me in 3-4 weeks and we can discuss adding something else like Cymbalta- Would not interact with Amitriptyline.   3. Dysautonomia- there is a cardiologist upstairs that helps with that. 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 Goes away when fixes the painful stimulus.    4. Chronic pain- Will get UDS/opiate contract- And can take over MS Contin 15 mg BID and Oxycodone 5 mg 4x/day as needed- will right once get UDS back.   5. Can pre-treat for pain- don't wait if you know when it's going to hit!  6.  Spasticity- Went over options of increasing Tizandine vs adding Dantrolene. Will call me in 1 month, and could add Dantrolene at that time.   7. Spasticity- Will increase Tizandine to 6 mg 4x/day x 1 week, then if need be, can increase to 8 mg 4x/day. If has too much sedation, then call me and let me know.    8.  Will wait on PT for now- and do once pain is more controlled.    9.  Went over prognosis- has 1 year ot hit maximal improvement.    10. F/U double visit- in 3 months/SCI  11. Might need to send to GI for stomach issues- will see at next appt.   12. Suggest that metoprolol be tiniest dose and short acting for autonomic symptoms.    I spent a total of 63 minutes on visit- spending time on prognosis, education on AD, dysautonomia, and bowel as well as spasticity   Screen cae back- is appropriate= will send in 1 month supply of Oxycodone 5 mg q4 hours prn #180 and MS Contin 15 mg BID- #60

## 2021-11-09 ENCOUNTER — Ambulatory Visit (INDEPENDENT_AMBULATORY_CARE_PROVIDER_SITE_OTHER): Payer: 59

## 2021-11-09 ENCOUNTER — Other Ambulatory Visit: Payer: Self-pay

## 2021-11-09 ENCOUNTER — Encounter: Payer: Self-pay | Admitting: Specialist

## 2021-11-09 ENCOUNTER — Telehealth: Payer: Self-pay

## 2021-11-09 ENCOUNTER — Ambulatory Visit (INDEPENDENT_AMBULATORY_CARE_PROVIDER_SITE_OTHER): Payer: 59 | Admitting: Specialist

## 2021-11-09 VITALS — BP 121/81 | HR 92 | Ht 69.0 in | Wt 187.4 lb

## 2021-11-09 DIAGNOSIS — Z981 Arthrodesis status: Secondary | ICD-10-CM | POA: Diagnosis not present

## 2021-11-09 DIAGNOSIS — G9589 Other specified diseases of spinal cord: Secondary | ICD-10-CM

## 2021-11-09 DIAGNOSIS — R0989 Other specified symptoms and signs involving the circulatory and respiratory systems: Secondary | ICD-10-CM

## 2021-11-09 DIAGNOSIS — M5030 Other cervical disc degeneration, unspecified cervical region: Secondary | ICD-10-CM

## 2021-11-09 DIAGNOSIS — S14105D Unspecified injury at C5 level of cervical spinal cord, subsequent encounter: Secondary | ICD-10-CM

## 2021-11-09 DIAGNOSIS — M542 Cervicalgia: Secondary | ICD-10-CM

## 2021-11-09 DIAGNOSIS — G8383 Posterior cord syndrome: Secondary | ICD-10-CM

## 2021-11-09 DIAGNOSIS — R292 Abnormal reflex: Secondary | ICD-10-CM

## 2021-11-09 LAB — TOXASSURE SELECT,+ANTIDEPR,UR

## 2021-11-09 MED ORDER — OXYCODONE HCL 5 MG PO TABS: 5.0000 mg | ORAL_TABLET | ORAL | 0 refills | Status: DC | PRN

## 2021-11-09 MED ORDER — MORPHINE SULFATE ER 15 MG PO TBEA: 1.0000 | EXTENDED_RELEASE_TABLET | Freq: Two times a day (BID) | ORAL | 0 refills | Status: AC

## 2021-11-09 NOTE — Addendum Note (Signed)
Addended by: Genice Rouge on: 11/09/2021 09:24 AM   Modules accepted: Orders

## 2021-11-09 NOTE — Progress Notes (Signed)
Post-Op Visit Note   Patient: Victor Huang           Date of Birth: Dec 25, 1976           MRN: [id number] Visit Date: 11/09/2021 PCP: Dema Severin, NP   Assessment & Plan:  Chief Complaint:  Chief Complaint  Patient presents with   Neck - Follow-up   Visit Diagnoses:  1. S/P cervical spinal fusion   2. Cervical cord myelomalacia (HCC)   3. Vasomotor phenomenon   4. Posterior cord syndrome (HCC)   5. Generalized hyperreflexia   6. Degenerative disc disease, cervical   7. Cervicalgia   8. Spinal cord injury at C5-C7 level without injury of spinal bone, subsequent encounter [facility])     Plan: Avoid overhead lifting and overhead use of the arms. Do not lift greater than 5 lbs. Adjust head rest in vehicle to prevent hyperextension if rear ended. Take extra precautions to avoid falling.  Follow-Up Instructions: Return in about 4 weeks (around 12/07/2021).   Orders:  Orders Placed This Encounter  Procedures   XR Cervical Spine 2 or 3 views   No orders of the defined types were placed in this encounter.   Imaging: XR Cervical Spine 2 or 3 views  Result Date: 11/09/2021 AP and flexion and extension radiographs show less than No flowsheet data found.  mm of motion between similar points over the posterior facets C5-6 . The anterior cervical fusions no longer show lucency.   PMFS History: Patient Active Problem List   Diagnosis Date Noted   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/2021    Priority: High    Class: Chronic   Other spondylosis with radiculopathy, cervical region 06/18/2021    Priority: High    Class: Chronic   Acute incomplete quadriplegia (HCC) 11/02/2021   Spasticity 11/02/2021   Chronic pain syndrome 11/02/2021   Chest pain of uncertain etiology 10/04/2021   Palpitations 10/04/2021   Cigarette smoker 10/04/2021   Anxiety disorder 09/25/2021   Asthma 09/25/2021   Depression 09/25/2021   Essential (primary) hypertension 09/25/2021   GERD  (gastroesophageal reflux disease) 09/25/2021   Headache 09/25/2021   Pneumonia 09/25/2021   Cervical myelopathy (HCC) 06/22/2021   Status post cervical spinal fusion 06/18/2021   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    Pneumonia    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/2021   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   Status post cervical spinal fusion 06/18/2021    Family History  Problem Relation Age of Onset   Hypertension Mother    Diabetes Mother     Past Surgical History:  Procedure Laterality Date   BACK SURGERY     POSTERIOR CERVICAL FUSION/FORAMINOTOMY N/A 06/18/2021   Procedure: POSTERIOR CERVICAL FUSION CERVICAL FIVE THROUGH SIX WITH TRIPLE WIRE TECHNIQUE FIXATION AND RIGHT ILIAC CREST BONE GRAFT HARVEST, LEFT CERVIACL SIX THROUGH SEVEN CERVICAL FORAMINAL LAMINOPLASTY;  Surgeon: Kerrin Champagne, MD;  Location: [facility] OR;  Service: Orthopedics;  Laterality: N/A;   Social History   Occupational History   Not on file  Tobacco Use   Smoking status: Every Day    Packs/day: 1.00    Types: Cigarettes  Start date: 01/01/1987   Smokeless tobacco: Never  Vaping Use   Vaping Use: Never used  Substance and Sexual Activity   Alcohol use: Never    Comment: no beer in over 15 years   Drug use: Not Currently   Sexual activity: Yes

## 2021-11-09 NOTE — Telephone Encounter (Signed)
Called to inform patient that his screen came back and his medication was sent in to [facility] [place]. Verbalized understanding

## 2021-11-09 NOTE — Patient Instructions (Signed)
Avoid overhead lifting and overhead use of the arms. Do not lift greater than 5 lbs. Adjust head rest in vehicle to prevent hyperextension if rear ended. Take extra precautions to avoid falling.  

## 2021-11-14 ENCOUNTER — Telehealth: Payer: Self-pay | Admitting: *Deleted

## 2021-11-14 NOTE — Telephone Encounter (Signed)
Urine drug screen for this encounter is consistent for prescribed medication 

## 2021-11-29 ENCOUNTER — Encounter: Payer: Self-pay | Admitting: Physical Medicine and Rehabilitation

## 2021-12-05 ENCOUNTER — Telehealth: Payer: Self-pay

## 2021-12-05 MED ORDER — POLYETHYLENE GLYCOL 3350 17 G PO PACK
17.0000 g | PACK | Freq: Every day | ORAL | 5 refills | Status: AC
Start: 2021-12-05 — End: ?

## 2021-12-05 NOTE — Telephone Encounter (Signed)
Patient called for a refill request on Oxycodone #180 last filled 11/09/21, Morphine #60 last filled 11/09/21 and Sennakot.

## 2021-12-07 ENCOUNTER — Encounter: Payer: Self-pay | Admitting: Physical Medicine and Rehabilitation

## 2021-12-07 ENCOUNTER — Telehealth: Payer: Self-pay

## 2021-12-07 MED ORDER — MORPHINE SULFATE ER 15 MG PO TBCR: 15.0000 mg | EXTENDED_RELEASE_TABLET | Freq: Two times a day (BID) | ORAL | 0 refills | Status: DC

## 2021-12-07 MED ORDER — OXYCODONE HCL 5 MG PO TABS: 5.0000 mg | ORAL_TABLET | ORAL | 0 refills | Status: DC | PRN

## 2021-12-07 NOTE — Telephone Encounter (Signed)
They told him they have 51 tablets but he will need a new Rx to get the remainder when it comes in. I have told him to go ahead and take the 51 tablets, and when he is down to the last couple of days call back and we will send in the additional 9 tablets.

## 2021-12-07 NOTE — Telephone Encounter (Signed)
See telephone message dealing with this issue.

## 2021-12-07 NOTE — Telephone Encounter (Signed)
Patient called stating that pharmacy don't have enough meds to give him. What to do?

## 2021-12-11 ENCOUNTER — Telehealth: Payer: Self-pay

## 2021-12-11 NOTE — Telephone Encounter (Signed)
Patient is calling stating he needs a note for jury duty stating why he cannot sit to be there for jury duty and what his diagnosis is . It needs to be faxed to [phone] Attn: Jury Section [employer] #[id number].

## 2021-12-12 NOTE — Telephone Encounter (Signed)
I wrote letter and signed- let pt know he will likely get in trouble with jury duty -ML

## 2021-12-12 NOTE — Telephone Encounter (Signed)
Letter was faxed to number provided by patient .

## 2021-12-19 ENCOUNTER — Ambulatory Visit (INDEPENDENT_AMBULATORY_CARE_PROVIDER_SITE_OTHER): Payer: 59 | Admitting: Specialist

## 2021-12-19 ENCOUNTER — Ambulatory Visit (INDEPENDENT_AMBULATORY_CARE_PROVIDER_SITE_OTHER): Payer: 59

## 2021-12-19 ENCOUNTER — Encounter: Payer: Self-pay | Admitting: Specialist

## 2021-12-19 ENCOUNTER — Other Ambulatory Visit: Payer: Self-pay

## 2021-12-19 VITALS — BP 123/81 | HR 101 | Ht 69.0 in | Wt 188.0 lb

## 2021-12-19 DIAGNOSIS — Z981 Arthrodesis status: Secondary | ICD-10-CM

## 2021-12-19 DIAGNOSIS — G9589 Other specified diseases of spinal cord: Secondary | ICD-10-CM | POA: Diagnosis not present

## 2021-12-19 NOTE — Progress Notes (Signed)
Office Visit Note   Patient: Victor Huang           Date of Birth: January 25, 1976           MRN: [id number] Visit Date: 12/19/2021              Requested by: Dema Severin, NP [address] [place],  [place] [address] PCP: Dema Severin, NP   Assessment & Plan: Visit Diagnoses:  1. S/P cervical spinal fusion     Plan:    Follow-Up Instructions: No follow-ups on file.   Orders:  Orders Placed This Encounter  Procedures   XR Cervical Spine 2 or 3 views   No orders of the defined types were placed in this encounter.     Procedures: No procedures performed   Clinical Data: No additional findings.   Subjective: Chief Complaint  Patient presents with   Neck - Follow-up    45 year old right handed male with history of previous anterior cervical fusion post left C5-6 and C6-7 and C7-T1 foramenotomies and fusion posteriorly at C5-6. He is working with rehab for pain management and  With Dr. Lolita Patella and she is working with him to control the different autonomic reflexia and various GI and cardiac and UGI and congestion problems.Presently having    Review of Systems   Objective: Vital Signs: BP 123/81 (BP Location: Left Arm, Patient Position: Sitting)    Pulse (!) 101    Ht 5\' 9"  (1.753 m)    Wt 188 lb (85.3 kg)    BMI 27.76 kg/m   Physical Exam  Ortho Exam  Specialty Comments:  No specialty comments available.  Imaging: No results found.   PMFS History: Patient Active Problem List   Diagnosis Date Noted   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/2021    Priority: High    Class: Chronic   Other spondylosis with radiculopathy, cervical region 06/18/2021    Priority: High    Class: Chronic   Acute incomplete quadriplegia (HCC) 11/02/2021   Spasticity 11/02/2021   Chronic pain syndrome 11/02/2021   Chest pain of uncertain etiology 10/04/2021   Palpitations 10/04/2021   Cigarette smoker 10/04/2021   Anxiety disorder 09/25/2021   Asthma 09/25/2021    Depression 09/25/2021   Essential (primary) hypertension 09/25/2021   GERD (gastroesophageal reflux disease) 09/25/2021   Headache 09/25/2021   Pneumonia 09/25/2021   Cervical myelopathy (HCC) 06/22/2021   Status post cervical spinal fusion 06/18/2021   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    Pneumonia    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/2021   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   Status post cervical spinal fusion 06/18/2021    Family History  Problem Relation Age of Onset   Hypertension Mother    Diabetes Mother     Past Surgical History:  Procedure Laterality Date   BACK SURGERY     POSTERIOR CERVICAL FUSION/FORAMINOTOMY N/A 06/18/2021   Procedure: POSTERIOR CERVICAL FUSION CERVICAL FIVE THROUGH SIX WITH TRIPLE WIRE TECHNIQUE FIXATION AND RIGHT ILIAC CREST BONE GRAFT HARVEST, LEFT CERVIACL SIX THROUGH SEVEN CERVICAL FORAMINAL LAMINOPLASTY;  Surgeon: 06/20/2021, MD;  Location: [facility] OR;  Service: Orthopedics;  Laterality: N/A;   Social History   Occupational History   Not on file  Tobacco Use   Smoking status: Every Day    Packs/day: 1.00    Types: Cigarettes    Start date: 01/01/1987   Smokeless tobacco: Never  Vaping Use   Vaping Use: Never used  Substance and Sexual Activity   Alcohol use: Never    Comment: no beer in over 15 years   Drug use: Not Currently   Sexual activity: Yes

## 2021-12-19 NOTE — Patient Instructions (Addendum)
Avoid overhead lifting and overhead use of the arms. Do not lift greater than 5 lbs. Adjust head rest in vehicle to prevent hyperextension if rear ended. Take extra precautions to avoid falling.  

## 2021-12-26 ENCOUNTER — Telehealth: Payer: Self-pay

## 2021-12-26 ENCOUNTER — Other Ambulatory Visit: Payer: Self-pay | Admitting: Physical Medicine and Rehabilitation

## 2021-12-26 NOTE — Telephone Encounter (Signed)
Patient called and stated he needs a prescription for the remainder of the prescription of his morphine 15 mg. Per PMP, he was dispensed 50 on 12/07/21 instead of 60 tablets.

## 2021-12-27 MED ORDER — MORPHINE SULFATE ER 15 MG PO TBCR: 15.0000 mg | EXTENDED_RELEASE_TABLET | Freq: Two times a day (BID) | ORAL | 0 refills | Status: DC

## 2021-12-27 NOTE — Telephone Encounter (Signed)
Left voicemail that prescription has been sent to pharmacy for 1 month supply

## 2021-12-27 NOTE — Telephone Encounter (Signed)
Sent in another regular dosing- #60- I think the pharmacy didn't have enough pills- thanks- ML

## 2022-01-02 ENCOUNTER — Telehealth: Payer: Self-pay | Admitting: *Deleted

## 2022-01-02 NOTE — Telephone Encounter (Signed)
Mr Robar called for refill on his oxycodone , pregabalin, and linzess. His next appt is 02/04/22.  Filled  Written  ID  Drug  QTY  Days  Prescriber  RX #  Dispenser  Refill  Daily Dose*  Pymt Type  PMP  12/31/2021 Morphine Sulf Er 15 Mg Tablet #60.00 30 Me Lov [id number] Nor ([id number]) 0/0  12/07/2021      Oxycodone Hcl (Ir) 5 Mg Tablet #180.00 30 Me Lov [id number] Nor ([id number]) 0/0 45.00  11/02/2021 Pregabalin 150 Mg Capsule #120.00 30 Me Lov [id number] Nor ([id number]) 0/5 4.02 LME

## 2022-01-03 MED ORDER — PREGABALIN 150 MG PO CAPS: 150.0000 mg | ORAL_CAPSULE | Freq: Four times a day (QID) | ORAL | 5 refills | Status: DC

## 2022-01-03 MED ORDER — OXYCODONE HCL 5 MG PO TABS: 5.0000 mg | ORAL_TABLET | ORAL | 0 refills | Status: DC | PRN

## 2022-01-03 MED ORDER — LINZESS 145 MCG PO CAPS: 145.0000 ug | ORAL_CAPSULE | Freq: Every day | ORAL | 5 refills | Status: DC | PRN

## 2022-01-03 NOTE — Telephone Encounter (Signed)
Notified. 

## 2022-01-05 ENCOUNTER — Encounter: Payer: Self-pay | Admitting: Physical Medicine and Rehabilitation

## 2022-01-17 MED ORDER — LINACLOTIDE 290 MCG PO CAPS: 290.0000 ug | ORAL_CAPSULE | Freq: Every day | ORAL | 5 refills | Status: DC

## 2022-01-28 ENCOUNTER — Ambulatory Visit: Payer: 59 | Admitting: Neurology

## 2022-01-30 ENCOUNTER — Other Ambulatory Visit: Payer: Self-pay | Admitting: Physical Medicine and Rehabilitation

## 2022-01-30 ENCOUNTER — Telehealth: Payer: Self-pay | Admitting: *Deleted

## 2022-01-30 ENCOUNTER — Other Ambulatory Visit: Payer: Self-pay | Admitting: Specialist

## 2022-01-30 ENCOUNTER — Encounter: Payer: Self-pay | Admitting: Physical Medicine and Rehabilitation

## 2022-01-30 MED ORDER — OXYCODONE HCL 5 MG PO TABS: 5.0000 mg | ORAL_TABLET | ORAL | 0 refills | Status: DC | PRN

## 2022-01-30 MED ORDER — MORPHINE SULFATE ER 15 MG PO TBCR: 15.0000 mg | EXTENDED_RELEASE_TABLET | Freq: Two times a day (BID) | ORAL | 0 refills | Status: DC

## 2022-01-30 NOTE — Telephone Encounter (Signed)
Victor Huang is asking for a refill on his pain medication. Last refill per PMP is 01/05/22 and he is requesting to chang his appt per MyChart to a video visit 02/04/22.

## 2022-01-31 ENCOUNTER — Telehealth: Payer: Self-pay

## 2022-01-31 MED ORDER — DULCOLAX 5 MG PO TBEC: 5.0000 mg | DELAYED_RELEASE_TABLET | Freq: Every day | ORAL | 0 refills | Status: DC | PRN

## 2022-01-31 NOTE — Telephone Encounter (Signed)
[facility] [place] [facility], he would like a prescription sent to [facility]. [facility].

## 2022-02-01 MED ORDER — MORPHINE SULFATE ER 15 MG PO TBCR: 15.0000 mg | EXTENDED_RELEASE_TABLET | Freq: Two times a day (BID) | ORAL | 0 refills | Status: DC

## 2022-02-01 MED ORDER — OXYCODONE HCL 5 MG PO TABS: 5.0000 mg | ORAL_TABLET | ORAL | 0 refills | Status: DC | PRN

## 2022-02-04 ENCOUNTER — Encounter: Payer: Self-pay | Admitting: Physical Medicine and Rehabilitation

## 2022-02-04 ENCOUNTER — Encounter
Payer: Medicaid Other | Attending: Physical Medicine and Rehabilitation | Admitting: Physical Medicine and Rehabilitation

## 2022-02-04 VITALS — BP 117/83 | HR 105 | Temp 97.9°F | Ht 69.0 in | Wt 197.5 lb

## 2022-02-04 DIAGNOSIS — G894 Chronic pain syndrome: Secondary | ICD-10-CM | POA: Diagnosis not present

## 2022-02-04 DIAGNOSIS — R252 Cramp and spasm: Secondary | ICD-10-CM | POA: Diagnosis not present

## 2022-02-04 DIAGNOSIS — G8250 Quadriplegia, unspecified: Secondary | ICD-10-CM | POA: Diagnosis not present

## 2022-02-04 DIAGNOSIS — K592 Neurogenic bowel, not elsewhere classified: Secondary | ICD-10-CM

## 2022-02-04 HISTORY — DX: Neurogenic bowel, not elsewhere classified: K59.2

## 2022-02-04 MED ORDER — METOPROLOL TARTRATE 25 MG PO TABS: 12.5000 mg | ORAL_TABLET | Freq: Two times a day (BID) | ORAL | 1 refills | Status: DC | PRN

## 2022-02-04 MED ORDER — FLUOXETINE HCL 20 MG PO CAPS: 20.0000 mg | ORAL_CAPSULE | Freq: Every day | ORAL | 3 refills | Status: DC

## 2022-02-04 NOTE — Progress Notes (Signed)
Subjective:    Patient ID: Victor Huang, male    DOB: January 02, 1976, 46 y.o.   MRN: [id number]  HPI  An audio/video tele-health visit is felt to be the most appropriate encounter for this patient at this time. This is a follow up tele-visit via phone. The patient is at home. MD is at office. Prior to scheduling this appointment, our staff discussed the limitations of evaluation and management by telemedicine and the availability of in-person appointments. The patient expressed understanding and agreed to proceed. Pt has COVID.    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  He now has chronic nerve and chronic pain since  surgery as well. 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  Is a C2 R hemi/SCI/quadriplegia- ASIA D.   Pt reports getting over COVID right now. Still coughing- treated with albuterol and an PO BX. Pain- taking Lyrica but only 150 mg BID- canot tolerate a higher dose- makes him feel weird/heart rate issues. Says his heart rate drops.   Pain wise-  he's stable otherwise. Taking his meds as prescribed . Bowel- has had 4-5 accidents- usually after taking "more laxatives". Usually when sleeping- doesn't feel it; doesn't wake him.  Takes a suppository if cannot go x 24-48 hou-  Doesn't do dig stim.   Also has hd sever abd distension-and had CT (not in Epic)- showed thickening of bowel wall/possible colitis- pt a wife concerned due to suppositories. Sees PCP-Regina York tomorrow in f/u about bowels.   ABX were for COVID, not colitis.  Mood- feel like "less of a man"-says can feel depressed when thinks about things- per wife very irritable- and agitated frequently.  Also waking up around 1-2 am almost every night for 2-3 weeks-  Cannot go back to sleep- also doesn't sleep in bed- in recliner- due to not being able to "get the right angle in  the bed"- for a few months.   Also c/o pain of C7- gets painful/Swollen" and irritated.  Not constant, but intermittent- but frustrating as well.   Dr Louanne Skye said could take back to surgery and remove hardware, but pt loath to do so- scared of more surgery.    Pain Inventory Average Pain 3 Pain Right Now 3 My pain is burning, stabbing, tingling, and numbness, swelling  LOCATION OF PAIN  neck, back, arms  BOWEL Number of stools per week: 7 Oral laxative use Yes  Type of laxative Linzess, Miralax Enema or suppository use Yes     BLADDER Normal     Mobility walk without assistance ability to climb steps?  no do you drive?  no  Function not employed: date last employed 06/18/21 I need assistance with the following:  dressing, bathing, and meal prep  Neuro/Psych bowel control problems weakness numbness tremor tingling spasms dizziness depression  Prior Studies Any changes since last visit?  no  Physicians involved in your care Any changes since last visit?  no   Family History  Problem Relation Age of Onset   Hypertension Mother    Diabetes Mother    Social History   Socioeconomic History   Marital status: Married    Spouse name: Not on file   Number of children: Not on file   Years of education: Not on file   Highest education level: Not on file  Occupational History  Not on file  Tobacco Use   Smoking status: Every Day    Packs/day: 1.00    Types: Cigarettes    Start date: 01/01/1987   Smokeless tobacco: Never  Vaping Use   Vaping Use: Never used  Substance and Sexual Activity   Alcohol use: Never    Comment: no beer in over 15 years   Drug use: Not Currently   Sexual activity: Yes  Other Topics Concern   Not on file  Social History Narrative   Not on file   Social Determinants of Health   Financial Resource Strain: Not on file  Food Insecurity: Not on file  Transportation Needs: Not on file  Physical Activity: Not on file   Stress: Not on file  Social Connections: Not on file   Past Surgical History:  Procedure Laterality Date   BACK SURGERY     POSTERIOR CERVICAL FUSION/FORAMINOTOMY N/A 06/18/2021   Procedure: POSTERIOR CERVICAL FUSION CERVICAL FIVE THROUGH SIX WITH TRIPLE WIRE TECHNIQUE FIXATION AND RIGHT ILIAC CREST BONE GRAFT HARVEST, LEFT [place] CERVICAL FORAMINAL LAMINOPLASTY;  Surgeon: Jessy Oto, MD;  Location: [facility];  Service: Orthopedics;  Laterality: N/A;   Past Medical History:  Diagnosis Date   Anxiety disorder    Asthma    as a child   Cervical myelopathy ([facility]) 06/22/2021   Depression    Essential (primary) hypertension    GERD (gastroesophageal reflux disease)    Headache    Other spondylosis with radiculopathy, cervical region 06/18/2021   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    Pneumonia    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/2021   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   Status post cervical spinal fusion 06/18/2021   BP 117/83 Comment: per patient   Pulse (!) 105 Comment: per patient   Temp 97.9 F (36.6 C) Comment: per patient   Ht 5\' 9"  (1.753 m)    Wt 197 lb 8 oz (89.6 kg) Comment: per patient   SpO2 98% Comment: per patient   BMI 29.17 kg/m   Opioid Risk Score:   Fall Risk Score:  `1  Depression screen PHQ 2/9  Depression screen [name] 2/9 02/04/2022 11/02/2021  Decreased Interest 0 0  Down, Depressed, Hopeless 0 3  PHQ - 2 Score 0 3  Altered sleeping - 3  Tired, decreased energy - 0  Change in appetite - 0  Feeling bad or failure about yourself  - 0  Trouble concentrating - 0  Moving slowly or fidgety/restless - 0  Suicidal thoughts - 0  PHQ-9 Score - 6     Review of Systems  Constitutional:  Positive for unexpected weight change.  HENT: Negative.    Eyes: Negative.   Respiratory: Negative.    Cardiovascular: Negative.   Gastrointestinal:  Positive for constipation.  Endocrine: Negative.   Genitourinary: Negative.   Musculoskeletal:  Positive for  back pain and neck pain.  Skin: Negative.   Allergic/Immunologic: Negative.   Neurological:  Positive for dizziness, tremors, weakness and numbness.  Hematological: Negative.   Psychiatric/Behavioral:  Positive for dysphoric mood and sleep disturbance.       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  He now has chronic nerve and  chronic pain since  surgery as well. 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  Is a C2 R hemi/SCI/quadriplegia- ASIA D. Now depressed with sleep issues as a result and neurogenic bowel/incontinence.   1. Bowel program for SCI patients (BP)   Use Dulcolax suppositories Start with doing bowel program within 1 hour after a meal- breakfast or dinner Take bowels meds -Po/oral at opposite time doing bowel program- if BP (bowel program) in evening, give PO meds in AM; and vice versa. Wait on this   Start with digital stimulation- up to 2nd knuckle- stimulate rectum x 30 seconds Place suppository- wait 10 minutes Do Dig stim/Digital stimulation) every 10-15 minutes until empty If gets severely constipated, >3 days, can use Magnesium Citrate and follow in 4 hours with suppository or any type of enema.  Takes 6 weeks or so to get bowel to have a habit.   3. Getting GI referral from PCP- however explained will need to make sure they know about neurogenic bowel as well as constipation since birth/issues. I will handle neurogenic bowel, but they need to handle ileus/colitis/constipation.   4. Will add Prozac 20 mg daily to list- for mood and to help with sleep.   5. Change metoprolol to tartate and ONLY take if Heart Rate >110 for 30 minutes or more- can take the smallest dose of it.    6. Suggest lidocaine patches  OTC- over the counter- for cervical irritation.    7. If that's not effective, will do trigger point injections at next visit.   8. Don't discuss more surgery until see if can treat cervical pain.   9. F/u in 3 months- double visit- SCI. And trigger point injections most likely.   I spent a total of  43  minutes on total care today- >50% coordination of care- due to discussing/educating on bowel program, discussing options for mood and how to treat elevated heart rate.  Also discussed how Zanaflex can cause low BP and make him feel bad- will see at next visit if have to change that.

## 2022-02-12 DIAGNOSIS — Z0289 Encounter for other administrative examinations: Secondary | ICD-10-CM

## 2022-02-13 ENCOUNTER — Telehealth: Payer: Self-pay | Admitting: Neurology

## 2022-02-13 ENCOUNTER — Telehealth (INDEPENDENT_AMBULATORY_CARE_PROVIDER_SITE_OTHER): Payer: 59 | Admitting: Neurology

## 2022-02-13 ENCOUNTER — Encounter: Payer: Self-pay | Admitting: Neurology

## 2022-02-13 DIAGNOSIS — G959 Disease of spinal cord, unspecified: Secondary | ICD-10-CM

## 2022-02-13 DIAGNOSIS — G894 Chronic pain syndrome: Secondary | ICD-10-CM | POA: Diagnosis not present

## 2022-02-13 DIAGNOSIS — K592 Neurogenic bowel, not elsewhere classified: Secondary | ICD-10-CM | POA: Diagnosis not present

## 2022-02-13 MED ORDER — LAMOTRIGINE 25 MG PO TABS: ORAL_TABLET | ORAL | 3 refills | Status: DC

## 2022-02-13 NOTE — Telephone Encounter (Signed)
..   Pt understands that although there may be some limitations with this type of visit, we will take all precautions to reduce any security or privacy concerns.  Pt understands that this will be treated like an in office visit and we will file with pt's insurance, and there may be a patient responsible charge related to this service. ? ?

## 2022-02-13 NOTE — Progress Notes (Signed)
Reason for visit: Cervical myelopathy  Victor Huang is an 46 y.o. male with a spinal cord injury  Virtual Visit via Video Note I connected with [name]  on 02/13/22 at  1:30 PM EST by a video enabled telemedicine application and verified that I am speaking with the correct person.  I discussed the limitations of evaluation and management by telemedicine and the availability of in person appointments. The patient expressed understanding and agreed to proceed.  Patient was in his home.  Provider was in the office. History of Present Illness:    Observations/Objective:   Assessment and Plan:  History of present illness: UPDATE 02/13/2022 He feels he has been stable.  He notes no new weakness or numbness.  He has constant right upper arm numbness and intermittent worsening right arm numbness, right leg numbness and sometimes left sided.   The intermittent numbness will be neck down.   He has mild right arm weakness.  Using the right arm sets off the autonomic dysreflexia.  Pain also triggers the dysreflexia.   He is on morphine, pregabalin, tizanidine and amitriptyline to help with pain issues related with his spinal cord hemorrhage.  Higher dose of pregabalin caused BP drop and he needed to drop the dose back.      Strength is fairly normal.  Gait is fine though balance is sometimes a little off.  Bladder function is fine.  He has had constipation.  If this occurs, his neurologic symptoms worsen.  He takes Linzess, Miralax, colace and occasionally uses a suppository.   Due to these symptoms, he only eats small portions.   If he over-eats he gets BP swings.       Limited physical examination showed the head was normocephalic and atraumatic.  Extraocular muscles were intact.  Facial strength was symmetric.  He was able to move both arms and hands.  Images reviewed today MRI of the cervical spine 06/22/2021 shows an acute hemorrhagic focus within the spinal cord towards the right  adjacent to C5.Marland Kitchen  C4-C6 ACDF.  MRI cervical 07/18/21 shows a small focus of chronic hemorrhage in the right posterolateral spinal cord adjacent to C5.  History of C5 hemorrhage and from Dr. Anne Hahn: Mr. Postlewaite is a right-handed Hispanic male with a history of a cervical myelopathy.  The patient underwent cervical spine surgery on 18 June 2021 and unfortunately had a complication with severe edema and hemorrhage into the spinal cord following the surgery.  The hemorrhage occurred around the C5 level.  A repeat MRI in July 2022 showed significant improvement in the spinal cord edema, with residual lesion in the lateral cord at the C5 level.  The patient has been left with severe neck stiffness and decreased mobility.  He has numbness of the right hand and neuropathic discomfort in the upper arm on the right with paresthesias and lancinating pains.  He occasionally may have some troubles on the left side as well but the right arm is the primary issue.  If he flexes his head, he may have some sensory alteration down into the legs.  The patient is also having episodes where he will have a buildup of pressure in the neck with onset of increased heart rate into the 135 range.  The patient may have flushing of the face and increased discomfort down the right arm with these events.  The events may be brought on by prolonged sitting or standing, he feels better when he is lying down.  Eating a large meal may also bring on an episode.  The patient has been seen through cardiology, he will be set up for a chemical stress test.  The patient does get some benefit from the tizanidine.  He is on amitriptyline chronically for sleep, he runs an elevated heart rate because of this.  He has recently been set up for physical therapy, this will be done in the near future.  He is on Lyrica and also takes opiate medications on a regular basis without complete control of the pain issue.    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   Status post cervical spinal fusion 06/18/2021    Past Surgical History:  Procedure Laterality Date   BACK SURGERY     POSTERIOR CERVICAL FUSION/FORAMINOTOMY N/A 06/18/2021   Procedure: POSTERIOR CERVICAL FUSION CERVICAL FIVE THROUGH SIX WITH TRIPLE WIRE TECHNIQUE FIXATION AND RIGHT ILIAC CREST BONE GRAFT HARVEST, LEFT CERVIACL SIX THROUGH SEVEN CERVICAL FORAMINAL LAMINOPLASTY;  Surgeon: Kerrin Champagne, MD;  Location: [facility];  Service: Orthopedics;  Laterality: N/A;    Family History  Problem Relation Age of Onset   Hypertension Mother    Diabetes Mother     Social history:  reports that he has been smoking cigarettes. He started smoking about 35 years ago. He has been smoking an average of 1 pack per day. He has never used smokeless tobacco. He reports that he does not currently use drugs. He reports that he does not drink alcohol.    Allergies  Allergen Reactions   Ibuprofen Rash    Medications:  Prior to Admission medications   Medication Sig Start Date End Date Taking? Authorizing Provider  amitriptyline (ELAVIL) 75 MG tablet Take 1 tablet (75 mg total) by mouth at bedtime. 06/26/21  Yes Kerrin Champagne, MD  bisacodyl (DULCOLAX) 5 MG EC tablet Take 1 tablet (5 mg total) by mouth daily as needed for moderate constipation. 08/25/21  Yes Kerrin Champagne, MD  LINZESS 145 MCG CAPS capsule Take 145 mcg by mouth daily as needed for constipation. 09/04/21  Yes [provider]  metoprolol succinate (TOPROL XL) 25 MG 24 hr tablet Take 1 tablet (25 mg total) by mouth daily as needed (palpations). 10/04/21  Yes  Revankar, Aundra Dubin, MD  Morphine Sulfate ER 15 MG TBEA Take 15 tablets by mouth every 12 (twelve) hours as needed. 10/06/21  Yes Eldred Manges, MD  oxyCODONE (ROXICODONE) 5 MG immediate release tablet Take 1 tablet (5 mg total) by mouth every 4 (four) hours as needed. 10/06/21 10/06/22 Yes Eldred Manges, MD  pregabalin (LYRICA) 50 MG capsule Take 100mg  po morning and at night  . 10/06/21  Yes Eldred Manges, MD  pregabalin (LYRICA) 75 MG capsule Take 75 mg by mouth daily. 08/15/21  Yes [provider]  tiZANidine (ZANAFLEX) 4 MG tablet Take 1 tablet (4 mg total) by mouth every 6 (six) hours. 07/17/21  Yes York Spaniel, MD    ROS:  Out of a complete 14 system review of symptoms, the patient complains  only of the following symptoms, and all other reviewed systems are negative.  Neck stiffness and pain Right arm numbness and discomfort Episodes of elevated heart rate    ________________________________________  Cervical myelopathy ([facility])  Chronic pain syndrome  Neurogenic bowel  He has increased autonomic symptoms associated with pain, eating larger meals and constipation.  I will add lamotrigine 25 mg p.o. twice daily.  Hopefully this will help with the pain which may allow [place] to reduce either or both of the amitriptyline and morphine medications that may be making the constipation worse. Continue other medications. Return in 4 months or sooner if there are new or worsening neurologic symptoms.  Follow Up Instructions: I discussed the assessment and treatment plan with the patient. The patient was provided an opportunity to ask questions and all were answered. The patient agreed with the plan and demonstrated an understanding of the instructions.    The patient was advised to call back or seek an in-person evaluation if the symptoms worsen or if the condition fails to improve as anticipated.  I provided 27 minutes of non-face-to-face time during this encounter.

## 2022-02-19 DIAGNOSIS — Z0271 Encounter for disability determination: Secondary | ICD-10-CM

## 2022-02-25 ENCOUNTER — Encounter: Payer: Self-pay | Admitting: Physical Medicine and Rehabilitation

## 2022-02-27 ENCOUNTER — Other Ambulatory Visit: Payer: Self-pay | Admitting: Physical Medicine and Rehabilitation

## 2022-02-27 MED ORDER — LINACLOTIDE 290 MCG PO CAPS: 290.0000 ug | ORAL_CAPSULE | Freq: Every day | ORAL | 5 refills | Status: DC

## 2022-02-28 ENCOUNTER — Telehealth: Payer: Self-pay | Admitting: *Deleted

## 2022-02-28 MED ORDER — MORPHINE SULFATE ER 15 MG PO TBCR: 15.0000 mg | EXTENDED_RELEASE_TABLET | Freq: Two times a day (BID) | ORAL | 0 refills | Status: DC

## 2022-02-28 MED ORDER — OXYCODONE HCL 5 MG PO TABS: 5.0000 mg | ORAL_TABLET | ORAL | 0 refills | Status: DC | PRN

## 2022-02-28 NOTE — Telephone Encounter (Signed)
Notified of refill. 

## 2022-02-28 NOTE — Telephone Encounter (Signed)
Mr Platz is requesting a refill on his morphine and oxycodone. He wants to be sure they are sent to [facility] and not the [facility]. Last fill date was 02/01/22. He doe not have the next appt scheduled but by last video vist it is Return in about 3 months (around 05/04/2022) for double visit- SCI/trigger point injections.I will ask adm to schedule. ?

## 2022-03-07 ENCOUNTER — Ambulatory Visit: Payer: 59 | Admitting: Neurology

## 2022-03-13 ENCOUNTER — Encounter: Payer: Self-pay | Admitting: Physician Assistant

## 2022-03-20 ENCOUNTER — Ambulatory Visit: Payer: 59 | Admitting: Specialist

## 2022-03-23 ENCOUNTER — Other Ambulatory Visit: Payer: Self-pay | Admitting: Physical Medicine and Rehabilitation

## 2022-03-24 ENCOUNTER — Encounter (HOSPITAL_COMMUNITY): Payer: Self-pay | Admitting: Student

## 2022-03-24 ENCOUNTER — Emergency Department (HOSPITAL_COMMUNITY): Payer: Medicaid Other

## 2022-03-24 ENCOUNTER — Emergency Department (HOSPITAL_COMMUNITY)
Admission: EM | Admit: 2022-03-24 | Discharge: 2022-03-25 | Disposition: A | Discharge status: Home / Self Care | Payer: Medicaid Other | Attending: Emergency Medicine | Admitting: Emergency Medicine

## 2022-03-24 DIAGNOSIS — K219 Gastro-esophageal reflux disease without esophagitis: Secondary | ICD-10-CM | POA: Insufficient documentation

## 2022-03-24 DIAGNOSIS — R4789 Other speech disturbances: Secondary | ICD-10-CM | POA: Diagnosis not present

## 2022-03-24 DIAGNOSIS — Z79899 Other long term (current) drug therapy: Secondary | ICD-10-CM | POA: Insufficient documentation

## 2022-03-24 DIAGNOSIS — R519 Headache, unspecified: Secondary | ICD-10-CM | POA: Insufficient documentation

## 2022-03-24 DIAGNOSIS — Z20822 Contact with and (suspected) exposure to covid-19: Secondary | ICD-10-CM | POA: Diagnosis not present

## 2022-03-24 DIAGNOSIS — R002 Palpitations: Secondary | ICD-10-CM | POA: Diagnosis not present

## 2022-03-24 DIAGNOSIS — M5000 Cervical disc disorder with myelopathy, unspecified cervical region: Secondary | ICD-10-CM | POA: Insufficient documentation

## 2022-03-24 DIAGNOSIS — J45909 Unspecified asthma, uncomplicated: Secondary | ICD-10-CM | POA: Diagnosis not present

## 2022-03-24 DIAGNOSIS — R0602 Shortness of breath: Secondary | ICD-10-CM | POA: Diagnosis not present

## 2022-03-24 DIAGNOSIS — Z79891 Long term (current) use of opiate analgesic: Secondary | ICD-10-CM | POA: Insufficient documentation

## 2022-03-24 DIAGNOSIS — I10 Essential (primary) hypertension: Secondary | ICD-10-CM | POA: Insufficient documentation

## 2022-03-24 DIAGNOSIS — R1084 Generalized abdominal pain: Secondary | ICD-10-CM | POA: Insufficient documentation

## 2022-03-24 LAB — I-STAT CHEM 8, ED
BUN: 16 mg/dL (ref 6–20)
Calcium, Ion: 0.99 mmol/L — ABNORMAL LOW (ref 1.15–1.40)
Chloride: 107 mmol/L (ref 98–111)
Creatinine, Ser: 0.6 mg/dL — ABNORMAL LOW (ref 0.61–1.24)
Glucose, Bld: 111 mg/dL — ABNORMAL HIGH (ref 70–99)
HCT: 42 % (ref 39.0–52.0)
Hemoglobin: 14.3 g/dL (ref 13.0–17.0)
Potassium: 4.6 mmol/L (ref 3.5–5.1)
Sodium: 139 mmol/L (ref 135–145)
TCO2: 25 mmol/L (ref 22–32)

## 2022-03-24 LAB — URINALYSIS, ROUTINE W REFLEX MICROSCOPIC
Bilirubin Urine: NEGATIVE
Glucose, UA: NEGATIVE mg/dL
Hgb urine dipstick: NEGATIVE
Ketones, ur: NEGATIVE mg/dL
Leukocytes,Ua: NEGATIVE
Nitrite: NEGATIVE
Protein, ur: NEGATIVE mg/dL
Specific Gravity, Urine: 1.01 (ref 1.005–1.030)
pH: 6 (ref 5.0–8.0)

## 2022-03-24 LAB — COMPREHENSIVE METABOLIC PANEL
ALT: 29 U/L (ref 0–44)
AST: 24 U/L (ref 15–41)
Albumin: 3.7 g/dL (ref 3.5–5.0)
Alkaline Phosphatase: 64 U/L (ref 38–126)
Anion gap: 10 (ref 5–15)
BUN: 12 mg/dL (ref 6–20)
CO2: 21 mmol/L — ABNORMAL LOW (ref 22–32)
Calcium: 9 mg/dL (ref 8.9–10.3)
Chloride: 107 mmol/L (ref 98–111)
Creatinine, Ser: 0.73 mg/dL (ref 0.61–1.24)
GFR, Estimated: >60 mL/min (ref >60)
Glucose, Bld: 112 mg/dL — ABNORMAL HIGH (ref 70–99)
Potassium: 4.3 mmol/L (ref 3.5–5.1)
Sodium: 138 mmol/L (ref 135–145)
Total Bilirubin: 0.5 mg/dL (ref 0.3–1.2)
Total Protein: 6.7 g/dL (ref 6.5–8.1)

## 2022-03-24 LAB — RESP PANEL BY RT-PCR (FLU A&B, COVID) ARPGX2
Influenza A by PCR: NEGATIVE
Influenza B by PCR: NEGATIVE
SARS Coronavirus 2 by RT PCR: NEGATIVE

## 2022-03-24 LAB — CBC
HCT: 45.2 % (ref 39.0–52.0)
Hemoglobin: 15 g/dL (ref 13.0–17.0)
MCH: 28 pg (ref 26.0–34.0)
MCHC: 33.2 g/dL (ref 30.0–36.0)
MCV: 84.5 fL (ref 80.0–100.0)
Platelets: 372 10*3/uL (ref 150–400)
RBC: 5.35 MIL/uL (ref 4.22–5.81)
RDW: 14 % (ref 11.5–15.5)
WBC: 21 10*3/uL — ABNORMAL HIGH (ref 4.0–10.5)
nRBC: 0 % (ref 0.0–0.2)

## 2022-03-24 LAB — RAPID URINE DRUG SCREEN, HOSP PERFORMED
Amphetamines: NOT DETECTED
Barbiturates: NOT DETECTED
Benzodiazepines: NOT DETECTED
Cocaine: NOT DETECTED
Opiates: POSITIVE — AB
Tetrahydrocannabinol: NOT DETECTED

## 2022-03-24 LAB — DIFFERENTIAL
Abs Immature Granulocytes: 0.11 10*3/uL — ABNORMAL HIGH (ref 0.00–0.07)
Basophils Absolute: 0.1 10*3/uL (ref 0.0–0.1)
Basophils Relative: 1 %
Eosinophils Absolute: 0.1 10*3/uL (ref 0.0–0.5)
Eosinophils Relative: 0 %
Immature Granulocytes: 1 %
Lymphocytes Relative: 17 %
Lymphs Abs: 3.5 10*3/uL (ref 0.7–4.0)
Monocytes Absolute: 1 10*3/uL (ref 0.1–1.0)
Monocytes Relative: 5 %
Neutro Abs: 16.2 10*3/uL — ABNORMAL HIGH (ref 1.7–7.7)
Neutrophils Relative %: 76 %

## 2022-03-24 LAB — PROTIME-INR
INR: 1 (ref 0.8–1.2)
Prothrombin Time: 13 seconds (ref 11.4–15.2)

## 2022-03-24 LAB — TROPONIN I (HIGH SENSITIVITY): Troponin I (High Sensitivity): 5 ng/L (ref <18)

## 2022-03-24 LAB — LIPASE, BLOOD: Lipase: 36 U/L (ref 11–51)

## 2022-03-24 LAB — ETHANOL: Alcohol, Ethyl (B): <10 mg/dL (ref <10)

## 2022-03-24 LAB — APTT: aPTT: 30 seconds (ref 24–36)

## 2022-03-24 MED ORDER — ONDANSETRON HCL 4 MG/2ML IJ SOLN
4.0000 mg | Freq: Once | INTRAMUSCULAR | Status: AC
  Administered 2022-03-24: 4 mg via INTRAVENOUS
  Filled 2022-03-24: qty 2

## 2022-03-24 MED ORDER — MORPHINE SULFATE (PF) 4 MG/ML IV SOLN
4.0000 mg | Freq: Once | INTRAVENOUS | Status: AC
  Administered 2022-03-24: 4 mg via INTRAVENOUS
  Filled 2022-03-24: qty 1

## 2022-03-24 MED ORDER — SODIUM CHLORIDE 0.9 % IV BOLUS
1000.0000 mL | Freq: Once | INTRAVENOUS | Status: AC
  Administered 2022-03-24: 1000 mL via INTRAVENOUS

## 2022-03-24 NOTE — ED Triage Notes (Signed)
Pt BIB [facility], per report pt had sudden onset of L sided headache. Pt took home pain medication w/ no relief and worsening neck pain and bilat leg tremors. Pt has no hx of htn, initial bp 188/110, HR of 124. Home cuff showed systolic of 200. Pt has aggressive bowel regimen, pt reports abd distension and no bm in 2-3 days. No urinary incontinence. ?

## 2022-03-24 NOTE — ED Notes (Signed)
Pt's family called RN into room for tachycardia and pt reporting a "shooting, shocking pain" through his chest. MD Silverio Lay aware, labwork obtained, EKG taken. Pt reports subsiding of pain as this RN is assessing him. ?

## 2022-03-24 NOTE — ED Provider Notes (Signed)
?[facility] ?Provider Note ? ? ?CSN: [id number] ?Arrival date & time: 03/24/22  2144 ? ?  ? ?History ? ?Chief Complaint  ?Patient presents with  ? Headache  ? ? ?Victor Huang is a 46 y.o. male with a hx of cervical myelopathy w/ RUE paresthesias, neurogenic bowel, spasticity, and chronic pain syndrome as well as hypertension, GERD, asthma, depression, & anxiety who presents to the ED with his wife via EMS for evaluation of headache that began @ 1800 tonight.  Patient reports that he developed a left temporal headache earlier this evening, he took his nighttime medication including his extended release morphine, immediate release oxycodone, amitriptyline, and tizanidine without much relief.  He went to have a bowel movement and started to get new pain in his neck that was severe.  With this worsened pain when walking back to the living room he started to feel dizzy as if he might pass out with slow speech, and limb shakiness in both the upper/lower extremities.  He did not have full loss of consciousness, however did seem to be nodding off at times per his wife.  She called EMS and checked his blood pressure and heart rate and noted him to be hypertensive and tachycardic.  Overall his symptoms have improved, speech feels back to normal  He still states that he has a 3/10 headache and 4/10 neck pain, his mouth feels very dry, he feels a bit short of breath, and overall fatigue.  He also notes some abdominal pain, his last bowel movement was 2 to 3 days ago, he has been passing gas, passing very small amount of stool earlier tonight, has not had any associated nausea or vomiting.  He denies fever, emesis, melena, hematochezia, new numbness/focal weakness, chest pain, or cough.  Recently started fluoxetine a few weeks ago, no other new meds. ? ? ?HPI ? ?  ? ?Home Medications ?Prior to Admission medications   ?Medication Sig Start Date End Date Taking? Authorizing Provider   ?FLUoxetine (PROZAC) 20 MG capsule TAKE 1 CAPSULE BY MOUTH EVERY DAY 02/27/22   Lovorn, Aundra MilletMegan, MD  ?amitriptyline (ELAVIL) 75 MG tablet Take 1 tablet (75 mg total) by mouth at bedtime. 06/26/21   Kerrin ChampagneNitka, [name] E, MD  ?bisacodyl (DULCOLAX) 5 MG EC tablet Take 1 tablet (5 mg total) by mouth daily as needed for moderate constipation. 01/31/22   Kerrin ChampagneNitka, [name] E, MD  ?lamoTRIgine (LAMICTAL) 25 MG tablet For 5 days take one pill po qd, then take one pill po bid 02/13/22   Sater, Pearletha Furlichard A, MD  ?linaclotide Karlene Einstein(LINZESS) 290 MCG CAPS capsule Take 1 capsule (290 mcg total) by mouth daily before breakfast. 02/27/22   Lovorn, Aundra MilletMegan, MD  ?metoprolol succinate (TOPROL XL) 25 MG 24 hr tablet Take 1 tablet (25 mg total) by mouth daily as needed (palpations). 10/04/21   Revankar, Aundra Dubinajan R, MD  ?metoprolol tartrate (LOPRESSOR) 25 MG tablet TAKE 0.5 TABLETS (12.5 MG TOTAL) BY MOUTH 2 (TWO) TIMES DAILY AS NEEDED (FOR FAST HEART RATE-). 02/28/22   Genice RougeLovorn, Megan, MD  ?morphine (MS CONTIN) 15 MG 12 hr tablet Take 1 tablet (15 mg total) by mouth every 12 (twelve) hours. 02/28/22   Lovorn, Aundra MilletMegan, MD  ?oxyCODONE (ROXICODONE) 5 MG immediate release tablet Take 1 tablet (5 mg total) by mouth every 4 (four) hours as needed. 02/28/22 02/28/23  Lovorn, Aundra MilletMegan, MD  ?polyethylene glycol (MIRALAX) 17 g packet Take 17 g by mouth daily. 12/05/21   Genice RougeLovorn, Megan, MD  ?  pregabalin (LYRICA) 150 MG capsule Take 1 capsule (150 mg total) by mouth 4 (four) times daily. 01/03/22   Lovorn, Aundra Millet, MD  ?tiZANidine (ZANAFLEX) 4 MG tablet Take 1.5 tablets (6 mg total) by mouth every 6 (six) hours. X 1 week, then can increase to 8mg /2 tabs 4x/day- for spasticity 11/02/21   13/4/22, MD  ?   ? ?Allergies    ?Ibuprofen   ? ?Review of Systems   ?Review of Systems  ?Constitutional:  Negative for chills and fever.  ?Respiratory:  Positive for shortness of breath. Negative for cough.   ?Cardiovascular:  Negative for chest pain.  ?Gastrointestinal:  Positive for abdominal pain and  constipation. Negative for blood in stool, nausea and vomiting.  ?Genitourinary:  Negative for difficulty urinating and dysuria.  ?Musculoskeletal:  Positive for neck pain.  ?Neurological:  Positive for dizziness, speech difficulty and headaches. Negative for syncope.  ?All other systems reviewed and are negative. ? ?Physical Exam ?Updated Vital Signs ?BP (!) 156/99 (BP Location: Left Arm)   Pulse 96   Resp 15   SpO2 99%  ?Physical Exam ?Vitals and nursing note reviewed.  ?Constitutional:   ?   General: He is not in acute distress. ?   Appearance: He is well-developed. He is not toxic-appearing.  ?HENT:  ?   Head: Normocephalic and atraumatic.  ?   Mouth/Throat:  ?   Comments: Dry mucous membranes. Uvula midline.  ?Eyes:  ?   General:     ?   Right eye: No discharge.     ?   Left eye: No discharge.  ?   Extraocular Movements: Extraocular movements intact.  ?   Conjunctiva/sclera: Conjunctivae normal.  ?   Comments: PERRL. Vision grossly intact.   ?Neck:  ?   Comments: Surgical scar to posterior midline C spine.  ?Cardiovascular:  ?   Rate and Rhythm: Normal rate and regular rhythm.  ?   Pulses:     ?     Radial pulses are 2+ on the right side and 2+ on the left side.  ?     Posterior tibial pulses are 2+ on the right side and 2+ on the left side.  ?Pulmonary:  ?   Effort: No respiratory distress.  ?   Breath sounds: Normal breath sounds. No wheezing or rales.  ?Abdominal:  ?   General: There is no distension.  ?   Palpations: Abdomen is soft.  ?   Tenderness: There is abdominal tenderness (mild generalized). There is no guarding.  ?Musculoskeletal:  ?   Cervical back: Neck supple. Spinous process tenderness and muscular tenderness present.  ?   Comments: No LE edema.   ?Skin: ?   General: Skin is warm and dry.  ?Neurological:  ?   Mental Status: He is alert.  ?   Comments: Clear speech. No obvious facial droop. Patient able to detect light touch x 4, however reports some decreased sensation to the RUE which he  states is baseline. 5/5 strength with grip strength, elbow flexion/extension, and ankle plantar/dorsiflexion. No pronator drift or LE drift noted. Intact finger to nose.   ?Psychiatric:     ?   Behavior: Behavior normal.  ? ? ?ED Results / Procedures / Treatments   ?Labs ?(all labs ordered are listed, but only abnormal results are displayed) ?Labs Reviewed  ?CBC - Abnormal; Notable for the following components:  ?    Result Value  ? WBC 21.0 (*)   ? All other  components within normal limits  ?DIFFERENTIAL - Abnormal; Notable for the following components:  ? Neutro Abs 16.2 (*)   ? Abs Immature Granulocytes 0.11 (*)   ? All other components within normal limits  ?COMPREHENSIVE METABOLIC PANEL - Abnormal; Notable for the following components:  ? CO2 21 (*)   ? Glucose, Bld 112 (*)   ? All other components within normal limits  ?RAPID URINE DRUG SCREEN, HOSP PERFORMED - Abnormal; Notable for the following components:  ? Opiates POSITIVE (*)   ? All other components within normal limits  ?I-STAT CHEM 8, ED - Abnormal; Notable for the following components:  ? Creatinine, Ser 0.60 (*)   ? Glucose, Bld 111 (*)   ? Calcium, Ion 0.99 (*)   ? All other components within normal limits  ?RESP PANEL BY RT-PCR (FLU A&B, COVID) ARPGX2  ?ETHANOL  ?PROTIME-INR  ?APTT  ?URINALYSIS, ROUTINE W REFLEX MICROSCOPIC  ?LIPASE, BLOOD  ?TROPONIN I (HIGH SENSITIVITY)  ?TROPONIN I (HIGH SENSITIVITY)  ? ? ?EKG ?EKG Interpretation ? ?Date/Time:  Sunday March 24 2022 21:54:01 EDT ?Ventricular Rate:  99 ?PR Interval:  132 ?QRS Duration: 99 ?QT Interval:  347 ?QTC Calculation: 446 ?R Axis:   64 ?Text Interpretation: Sinus rhythm No significant change was found Confirmed by Richardean Canal [phone]) on 03/24/2022 10:30:09 PM ? ?Radiology ?CT ANGIO HEAD NECK W WO CM ? ?Result Date: 03/25/2022 ?CLINICAL DATA:  Neuro deficit, stroke suspected, left-sided headache, worsening neck pain EXAM: CT ANGIOGRAPHY HEAD AND NECK TECHNIQUE: Multidetector CT imaging of the  head and neck was performed using the standard protocol during bolus administration of intravenous contrast. Multiplanar CT image reconstructions and MIPs were obtained to evaluate the vascular anatomy. Carotid stenosis meas

## 2022-03-25 ENCOUNTER — Telehealth: Payer: Self-pay | Admitting: *Deleted

## 2022-03-25 ENCOUNTER — Emergency Department (HOSPITAL_COMMUNITY): Payer: Medicaid Other

## 2022-03-25 ENCOUNTER — Encounter: Payer: Self-pay | Admitting: Physical Medicine and Rehabilitation

## 2022-03-25 LAB — TROPONIN I (HIGH SENSITIVITY): Troponin I (High Sensitivity): 5 ng/L (ref <18)

## 2022-03-25 MED ORDER — IOHEXOL 350 MG/ML SOLN
150.0000 mL | Freq: Once | INTRAVENOUS | Status: AC | PRN
  Administered 2022-03-25: 150 mL via INTRAVENOUS

## 2022-03-25 MED ORDER — ALUM & MAG HYDROXIDE-SIMETH 200-200-20 MG/5ML PO SUSP
30.0000 mL | Freq: Once | ORAL | Status: AC
  Administered 2022-03-25: 30 mL via ORAL
  Filled 2022-03-25: qty 30

## 2022-03-25 MED ORDER — IOHEXOL 350 MG/ML SOLN: 100.0000 mL | Freq: Once | INTRAVENOUS | Status: DC | PRN

## 2022-03-25 NOTE — Telephone Encounter (Signed)
[name] needs to speak with Dr Berline Chough about Victor Huang's ED visit last night. ?

## 2022-03-25 NOTE — Discharge Instructions (Addendum)
You were seen in the emergency department today for a headache, dizziness, chest pain, and palpitations.  Your emergency department work-up was overall reassuring.  Your blood work did show that your white blood cell count was elevated as it somewhat has been in the past, please continue to have this monitored by your primary care provider.  The CT scan of your head and neck did not show any significant/large areas of blood vessel narrowing or bleeding.  The CT scan of your chest did not show any significant acute problems.  The CT scan of your chest did show some changes in your lungs as well as possible nodule, this will need repeat imaging, please discuss with your primary care provider.  Please do not drive or operate heavy machinery until you see your neurologist. ? ? ?Please stay well-hydrated.  Please get plenty of rest.  Please follow-up with primary care, neurology, and cardiology as soon as possible.  Return to the ER for new or worsening symptoms including but not limited to new or worsening pain, passing out, inability to keep fluids down, fever, new numbness/weakness, change in speech, facial droop, seizure activity, or any other concerns. ?

## 2022-03-26 ENCOUNTER — Ambulatory Visit: Payer: Medicaid Other | Admitting: Physician Assistant

## 2022-03-26 NOTE — Telephone Encounter (Signed)
Spoke to pt's wife- he had SEVERE autonomic dysreflexia- and ended up appropriately ED with BP [id number] systolic-  ?I went over with wife again about AD Sx's and signs and how to try and treat- ?Don't think he needs nitroglycerin pills for high BP yet- and that's the only medicine I feel comfortable giving in AD, to bring BP down, because only lasts 15 minutes. Thanks- ML

## 2022-03-27 ENCOUNTER — Other Ambulatory Visit: Payer: Self-pay | Admitting: Physical Medicine and Rehabilitation

## 2022-03-28 MED ORDER — OXYCODONE HCL 5 MG PO TABS
5.0000 mg | ORAL_TABLET | ORAL | 0 refills | Status: DC | PRN
Start: 2022-03-28 — End: 2022-04-19

## 2022-03-28 MED ORDER — NITROGLYCERIN 0.4 MG SL SUBL: 0.4000 mg | SUBLINGUAL_TABLET | SUBLINGUAL | 11 refills | Status: DC | PRN

## 2022-03-28 MED ORDER — MORPHINE SULFATE ER 15 MG PO TBCR: 15.0000 mg | EXTENDED_RELEASE_TABLET | Freq: Two times a day (BID) | ORAL | 0 refills | Status: DC

## 2022-03-28 NOTE — Telephone Encounter (Signed)
Went over Nitroglycerin- for AD- will send to pharmacy.  ? ? ?Use if 160/110- only use the Nitroglycerin for elevated BP, since it can drop BP pretty fast.  ? ?It will make him have bad Headache.  ? ?It/BP will continue to rise until fix the underlying cause.  ? ?So, don't try to treat high BP- try to treat the autonomic dysreflexia. ?Other signs other than high BP are severe Headache; goose bumps above level of lesion, nasal congestion and sense of impending doom.  ? ?Is having more frequent BM's- since wife is making him take Miralax.  ?Maybe less symptoms with abd pain since then.  ? ?Might look into Valium for spasticity? Can also help Autonomic dysreflexia that's too frequent.  ?Neck locks up on him, but explained that's not spasticity- that's muscle tightness- ? ?Only taking lyrica 150 mg 2x/day- since it lowered BP.  ? ?  ? ? ?

## 2022-03-29 ENCOUNTER — Encounter: Payer: Self-pay | Admitting: *Deleted

## 2022-03-29 ENCOUNTER — Telehealth: Payer: Self-pay | Admitting: *Deleted

## 2022-03-29 NOTE — Telephone Encounter (Signed)
Prior auth submitted to [facility] via CoverMyMeds for Morphine Sulfate ER 15 mg #60. ?

## 2022-03-29 NOTE — Telephone Encounter (Signed)
Approval Timeframe: Start Date 03/29/2022 End Date 06/28/2022  Pharmacy and patient notified. ?

## 2022-03-31 ENCOUNTER — Other Ambulatory Visit: Payer: Self-pay | Admitting: Physical Medicine and Rehabilitation

## 2022-04-04 ENCOUNTER — Ambulatory Visit (INDEPENDENT_AMBULATORY_CARE_PROVIDER_SITE_OTHER): Payer: Medicaid Other | Admitting: Internal Medicine

## 2022-04-04 ENCOUNTER — Encounter: Payer: Self-pay | Admitting: Internal Medicine

## 2022-04-04 VITALS — BP 120/80 | HR 88 | Ht 69.0 in | Wt 198.2 lb

## 2022-04-04 DIAGNOSIS — R1013 Epigastric pain: Secondary | ICD-10-CM

## 2022-04-04 DIAGNOSIS — M6289 Other specified disorders of muscle: Secondary | ICD-10-CM

## 2022-04-04 DIAGNOSIS — F1190 Opioid use, unspecified, uncomplicated: Secondary | ICD-10-CM

## 2022-04-04 DIAGNOSIS — K5900 Constipation, unspecified: Secondary | ICD-10-CM

## 2022-04-04 DIAGNOSIS — K219 Gastro-esophageal reflux disease without esophagitis: Secondary | ICD-10-CM

## 2022-04-04 MED ORDER — OMEPRAZOLE 40 MG PO CPDR: 40.0000 mg | DELAYED_RELEASE_CAPSULE | Freq: Every day | ORAL | 3 refills | Status: DC

## 2022-04-04 NOTE — Progress Notes (Signed)
? ?Chief Complaint: Abdominal pain, constipation ? ?HPI : 46 year old male with history of GERD, headache, autonomic dysreflexia presents with abdominal pain and constipation ? ?Patient states that ever since his cervical neck fusion surgery that was performed in 05/2021, he has continued to have issues with abdominal distention.  He has struggled with constipation issues.  If he does not have a bowel movement, this can cause severe dysautonomia reactions in the patient.  This usually manifests as fluctuations in his blood pressure.  Patient is currently on Linzess to 290 mcg daily, Dulcolax, MiraLAX along with suppositories and enemas as needed.  Unfortunately when he takes laxatives, he will sometimes have fecal incontinence due to lack of sensation of stool coming out.  Denies blood in stools.  Endorses epigastric abdominal pain that gets better after he has a bowel movement.  Eating seems to make his pain worse.  Denies prior colonoscopy or EGD.  He has gained about 55 pounds since 07/2021.  He was told recently that he has an umbilical hernia.  He does take oxycodone 5 to 6 tablets/day along with morphine ER 15 mg twice daily.  Denies dysphagia.  Has had issues with GERD ever since his surgery.  Endorses nausea. Denies vomiting. Her children have EoE along with food allergies. Denies other fam hx of GI cancers.  He has difficulty with extended physical activity, and has been trying to be better about drinking more water ? ?Past Medical History:  ?Diagnosis Date  ? Anxiety disorder   ? Asthma   ? as a child  ? Autonomic dysreflexia   ? Cervical myelopathy (HCC) 06/22/2021  ? Depression   ? Diverticulosis   ? Essential (primary) hypertension   ? GERD (gastroesophageal reflux disease)   ? Headache   ? 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 Pneumonia   ? 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 Status post  cervical spinal fusion 06/18/2021  ? ? ? ?Past Surgical History:  ?Procedure Laterality Date  ? POSTERIOR CERVICAL FUSION/FORAMINOTOMY N/A 06/18/2021  ? Procedure: POSTERIOR CERVICAL FUSION CERVICAL FIVE THROUGH SIX WITH TRIPLE WIRE TECHNIQUE FIXATION AND RIGHT ILIAC CREST BONE GRAFT HARVEST, LEFT CERVIACL SIX THROUGH SEVEN CERVICAL FORAMINAL LAMINOPLASTY;  Surgeon: Kerrin ChampagneNitka, [name] E, MD;  Location: [facility] OR;  Service: Orthopedics;  Laterality: N/A;  ? ?Family History  ?Problem Relation Age of Onset  ? Hypertension Mother   ? Diabetes Mother   ? Cervical cancer Mother   ? Heart attack Father   ? Asthma Maternal Grandmother   ? Food Allergy Son   ? Seizures Son   ? Allergies Son   ? Colitis Son   ? GER disease Son   ? Irritable bowel syndrome Son   ? Asthma Son   ? Food Allergy Son   ? Allergies Son   ? Other Son   ?     eosinophilic esophagitis  ? Colitis Son   ? GER disease Son   ? Asthma Son   ? Colon cancer Neg Hx   ? Esophageal cancer Neg Hx   ? Stomach cancer Neg Hx   ? Pancreatic cancer Neg Hx   ? Colon polyps Neg Hx   ? ?Social History  ? ?Tobacco Use  ? Smoking status: Every Day  ?  Packs/day: 1.00  ?  Types: Cigarettes  ?  Start date: 01/01/1987  ? Smokeless tobacco: Never  ?Vaping  Use  ? Vaping Use: Never used  ?Substance Use Topics  ? Alcohol use: Never  ?  Comment: no beer in over 15 years  ? Drug use: Not Currently  ? ?Current Outpatient Medications  ?Medication Sig Dispense Refill  ? amitriptyline (ELAVIL) 75 MG tablet Take 1 tablet (75 mg total) by mouth at bedtime. 30 tablet 3  ? bisacodyl (DULCOLAX) 5 MG EC tablet Take 1 tablet (5 mg total) by mouth daily as needed for moderate constipation. 30 tablet 0  ? FLUoxetine (PROZAC) 20 MG capsule TAKE 1 CAPSULE BY MOUTH EVERY DAY (Patient not taking: Reported on 04/04/2022) 90 capsule 2  ? linaclotide (LINZESS) 290 MCG CAPS capsule Take 1 capsule (290 mcg total) by mouth daily before breakfast. 30 capsule 5  ? metoprolol succinate (TOPROL XL) 25 MG 24 hr tablet Take  1 tablet (25 mg total) by mouth daily as needed (palpations). 30 tablet 6  ? metoprolol tartrate (LOPRESSOR) 25 MG tablet TAKE 0.5 TABLETS (12.5 MG TOTAL) BY MOUTH 2 (TWO) TIMES DAILY AS NEEDED (FOR FAST HEART RATE-). 30 tablet 1  ? morphine (MS CONTIN) 15 MG 12 hr tablet Take 1 tablet (15 mg total) by mouth every 12 (twelve) hours. 60 tablet 0  ? nitroGLYCERIN (NITROSTAT) 0.4 MG SL tablet Place 1 tablet (0.4 mg total) under the tongue every 15 (fifteen) minutes as needed (for autonomic dysreflexia- max 3 tab/day). 12 tablet 11  ? oxyCODONE (ROXICODONE) 5 MG immediate release tablet Take 1 tablet (5 mg total) by mouth every 4 (four) hours as needed. 180 tablet 0  ? polyethylene glycol (MIRALAX) 17 g packet Take 17 g by mouth daily. 28 each 5  ? pregabalin (LYRICA) 150 MG capsule Take 1 capsule (150 mg total) by mouth 4 (four) times daily. 120 capsule 5  ? tiZANidine (ZANAFLEX) 4 MG tablet Take 2 tablets (8 mg total) by mouth every 6 (six) hours. 720 tablet 1  ? lamoTRIgine (LAMICTAL) 25 MG tablet For 5 days take one pill po qd, then take one pill po bid (Patient not taking: Reported on 04/04/2022) 60 tablet 3  ? ?No current facility-administered medications for this visit.  ? ?Allergies  ?Allergen Reactions  ? Ibuprofen Rash  ? ? ? ?Review of Systems: ?All systems reviewed and negative except where noted in HPI.  ? ?Physical Exam: ?BP 120/80 (BP Location: Left Arm, Patient Position: Sitting, Cuff Size: Normal)   Pulse 88   Ht 5\' 9"  (1.753 m)   Wt 198 lb 4 oz (89.9 kg)   BMI 29.28 kg/m?  ?Constitutional: Pleasant,well-developed, male in no acute distress. ?HEENT: Normocephalic and atraumatic. Conjunctivae are normal. No scleral icterus. ?Cardiovascular: Normal rate, regular rhythm.  ?Pulmonary/chest: Effort normal and breath sounds normal. No wheezing, rales or rhonchi. ?Abdominal: Soft, nondistended, tender in the epigastric area. Umbilical hernia present that is soft and reducible. Bowel sounds active throughout.  There are no masses palpable. No hepatomegaly. ?Extremities: Trace BLE edema ?Neurological: Alert and oriented to person place and time. ?Skin: Skin is warm and dry. No rashes noted. ?Psychiatric: Normal mood and affect. Behavior is normal. ? ?Labs 02/2022: CBC with elevated WBC of 21 (persistent over the last several months). CMP unremarkable. Lipase normal.  ? ?CTA head and neck 03/25/22: ?IMPRESSION: ?1.  No acute intracranial process. ?2.  No hemodynamically significant stenosis in the neck. ?3. No intracranial large vessel occlusion. Mild stenosis in the left ?V4 segment. No other significant intracranial stenosis. ?4. For findings in the chest, please  see same-day CT chest abdomen ?pelvis. ? ?CTA C/A/P 03/25/22: ?IMPRESSION: ?1. No acute intrathoracic, abdominal, or pelvic pathology. No aortic ?dissection or aneurysm. ?2. No pulmonary artery embolus identified. ?3. Paraseptal emphysema. Diffuse hazy density throughout the lungs ?may represent atelectasis, edema, or related to chronic lung disease ?or pneumonitis. ?4. Faint ground-glass nodules versus confluence of interstitial ?markings/edema. These measure up to 9 mm in the left upper lobe. ?Attention on follow-up imaging recommended. ?5. Aortic Atherosclerosis (ICD10-I70.0) and Emphysema (ICD10-J43.9). ? ?ASSESSMENT AND PLAN: ?Constipation ?Epigastric abdominal pain ?Opioid dependence ?GERD ?Patient presents with constipation that has been on ever since his neck surgery in 2022.  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 Will attempt to institute some conservative measures to help with his constipation symptoms.  We will plan for a colonoscopy for further evaluation, which will also allow for a bowel clean out. Plan for pelvic floor PT referral.  Will start him on PPI therapy to help with acid reflux ?- Encourage 8 drinking cups  of water per day ?- Increase physical activity if possible ?- Start omeprazole 40 mg QD, take 30 minutes before eating ?- EGD/colonoscopy. With his history of dysautonomia may be safer to perform his procedur

## 2022-04-04 NOTE — Patient Instructions (Signed)
If you are age 44 or older, your body mass index should be between 23-30. Your Body mass index is 29.28 kg/m?Marland Kitchen If this is out of the aforementioned range listed, please consider follow up with your Primary Care Provider. ? ?If you are age 67 or younger, your body mass index should be between 19-25. Your Body mass index is 29.28 kg/m?Marland Kitchen If this is out of the aformentioned range listed, please consider follow up with your Primary Care Provider.  ? ?We will contact you regarding scheduling hospital procedure. ? ?We have sent a referral to pelvic floor therapy and the will contact you to make an appointment. ? ?Drink 8 cups of water daily. ? ?Increase Physical activity. ? ?We have sent the following medications to your pharmacy for you to pick up at your convenience: Prilosec 40 mg daily before eating.  ? ? ?The [facility] providers would like to encourage you to use [facility] to communicate with providers for non-urgent requests or questions.  Due to long hold times on the telephone, sending your provider a message by [facility] may be a faster and more efficient way to get a response.  Please allow 48 business hours for a response.  Please remember that this is for non-urgent requests.  ? ?It was a pleasure to see you today! ? ?Thank you for trusting me with your gastrointestinal care!   ? ?Eulah Pont, MD  ? ?

## 2022-04-06 NOTE — Anesthesia Preprocedure Evaluation (Addendum)
Anesthesia Evaluation  ?Patient identified by MRN, date of birth, ID band ?Patient awake ? ? ? ?Reviewed: ?Allergy & Precautions, NPO status , Patient's Chart, lab work & pertinent test results ? ?Airway ?Mallampati: II ? ?TM Distance: >3 FB ?Neck ROM: Limited ? ? ? Dental ? ?(+) Teeth Intact, Dental Advisory Given ?  ?Pulmonary ?neg pulmonary ROS, asthma , Current Smoker and Patient abstained from smoking.,  ?  ?Pulmonary exam normal ? ? ? ? ? ? ? Cardiovascular ?hypertension, Pt. on medications and Pt. on home beta blockers ?negative cardio ROS ? ? ?Rhythm:Regular Rate:Normal ? ? ?  ?Neuro/Psych ? Headaches, PSYCHIATRIC DISORDERS Anxiety Depression  Neuromuscular disease negative neurological ROS ? negative psych ROS  ? GI/Hepatic ?negative GI ROS, Neg liver ROS, GERD  Medicated,  ?Endo/Other  ?negative endocrine ROS ? Renal/GU ?negative Renal ROS  ?negative genitourinary ?  ?Musculoskeletal ?negative musculoskeletal ROS ?(+) Arthritis , Osteoarthritis,  Cervical spondylolisthesis   ? Abdominal ?(+)  ?Abdomen: soft. ?Bowel sounds: normal.  ?Peds ?negative pediatric ROS ?(+)  Hematology ?negative hematology ROS ?(+)   ?Anesthesia Other Findings ? ? Reproductive/Obstetrics ?negative OB ROS ? ?  ? ? ? ? ? ? ? ? ? ? ? ? ? ?  ?  ? ? ? ? ? ? ? ?Anesthesia Physical ? ?Anesthesia Plan ? ?ASA: 3 ? ?Anesthesia Plan: MAC  ? ?Post-op Pain Management:   ? ?Induction: Intravenous ? ?PONV Risk Score and Plan: 1 and Propofol infusion ? ?Airway Management Planned: Natural Airway ? ?Additional Equipment: None ? ?Intra-op Plan:  ? ?Post-operative Plan:  ? ?Informed Consent: I have reviewed the patients History and Physical, chart, labs and discussed the procedure including the risks, benefits and alternatives for the proposed anesthesia with the patient or authorized representative who has indicated his/her understanding and acceptance.  ? ? ? ?Dental advisory given ? ?Plan Discussed with:  CRNA ? ?Anesthesia Plan Comments:   ? ? ? ? ? ? ?Anesthesia Quick Evaluation ? ?

## 2022-04-08 ENCOUNTER — Ambulatory Visit (HOSPITAL_COMMUNITY): Payer: Medicaid Other | Admitting: Anesthesiology

## 2022-04-08 ENCOUNTER — Encounter (HOSPITAL_COMMUNITY)
Admission: RE | Disposition: A | Discharge status: Home / Self Care | Payer: Self-pay | Source: Home / Self Care | Attending: Internal Medicine

## 2022-04-08 ENCOUNTER — Encounter (HOSPITAL_COMMUNITY): Payer: Self-pay | Admitting: Internal Medicine

## 2022-04-08 ENCOUNTER — Ambulatory Visit (HOSPITAL_COMMUNITY)
Admission: RE | Admit: 2022-04-08 | Discharge: 2022-04-08 | Disposition: A | Discharge status: Home / Self Care | Payer: Medicaid Other | Attending: Internal Medicine | Admitting: Internal Medicine

## 2022-04-08 ENCOUNTER — Telehealth: Payer: Self-pay

## 2022-04-08 ENCOUNTER — Other Ambulatory Visit: Payer: Self-pay

## 2022-04-08 ENCOUNTER — Ambulatory Visit (HOSPITAL_BASED_OUTPATIENT_CLINIC_OR_DEPARTMENT_OTHER): Payer: Medicaid Other | Admitting: Anesthesiology

## 2022-04-08 DIAGNOSIS — F419 Anxiety disorder, unspecified: Secondary | ICD-10-CM | POA: Diagnosis not present

## 2022-04-08 DIAGNOSIS — K219 Gastro-esophageal reflux disease without esophagitis: Secondary | ICD-10-CM | POA: Diagnosis not present

## 2022-04-08 DIAGNOSIS — K922 Gastrointestinal hemorrhage, unspecified: Secondary | ICD-10-CM | POA: Diagnosis not present

## 2022-04-08 DIAGNOSIS — K2289 Other specified disease of esophagus: Secondary | ICD-10-CM | POA: Diagnosis not present

## 2022-04-08 DIAGNOSIS — K635 Polyp of colon: Secondary | ICD-10-CM

## 2022-04-08 DIAGNOSIS — F32A Depression, unspecified: Secondary | ICD-10-CM | POA: Insufficient documentation

## 2022-04-08 DIAGNOSIS — R1013 Epigastric pain: Secondary | ICD-10-CM | POA: Insufficient documentation

## 2022-04-08 DIAGNOSIS — K319 Disease of stomach and duodenum, unspecified: Secondary | ICD-10-CM | POA: Diagnosis not present

## 2022-04-08 DIAGNOSIS — K5900 Constipation, unspecified: Secondary | ICD-10-CM | POA: Diagnosis not present

## 2022-04-08 DIAGNOSIS — F1190 Opioid use, unspecified, uncomplicated: Secondary | ICD-10-CM

## 2022-04-08 DIAGNOSIS — K648 Other hemorrhoids: Secondary | ICD-10-CM | POA: Insufficient documentation

## 2022-04-08 DIAGNOSIS — I10 Essential (primary) hypertension: Secondary | ICD-10-CM | POA: Diagnosis not present

## 2022-04-08 DIAGNOSIS — M6289 Other specified disorders of muscle: Secondary | ICD-10-CM

## 2022-04-08 DIAGNOSIS — F17210 Nicotine dependence, cigarettes, uncomplicated: Secondary | ICD-10-CM | POA: Insufficient documentation

## 2022-04-08 DIAGNOSIS — Z79899 Other long term (current) drug therapy: Secondary | ICD-10-CM | POA: Insufficient documentation

## 2022-04-08 HISTORY — PX: POLYPECTOMY: SHX5525

## 2022-04-08 HISTORY — PX: BIOPSY: SHX5522

## 2022-04-08 HISTORY — PX: COLONOSCOPY WITH PROPOFOL: SHX5780

## 2022-04-08 HISTORY — PX: ESOPHAGOGASTRODUODENOSCOPY (EGD) WITH PROPOFOL: SHX5813

## 2022-04-08 SURGERY — COLONOSCOPY WITH PROPOFOL
Anesthesia: Monitor Anesthesia Care

## 2022-04-08 MED ORDER — LACTATED RINGERS IV SOLN: INTRAVENOUS | Status: DC

## 2022-04-08 MED ORDER — OMEPRAZOLE 40 MG PO CPDR: 40.0000 mg | DELAYED_RELEASE_CAPSULE | Freq: Two times a day (BID) | ORAL | 3 refills | Status: DC

## 2022-04-08 MED ORDER — SODIUM CHLORIDE 0.9 % IV SOLN: INTRAVENOUS | Status: DC

## 2022-04-08 MED ORDER — PROPOFOL 1000 MG/100ML IV EMUL
INTRAVENOUS | Status: AC
  Filled 2022-04-08: qty 100

## 2022-04-08 MED ORDER — PROPOFOL 500 MG/50ML IV EMUL
INTRAVENOUS | Status: AC
  Filled 2022-04-08: qty 50

## 2022-04-08 MED ORDER — PROPOFOL 10 MG/ML IV BOLUS
INTRAVENOUS | Status: DC | PRN
  Administered 2022-04-08 (×2): 20 mg via INTRAVENOUS

## 2022-04-08 MED ORDER — PROPOFOL 500 MG/50ML IV EMUL
INTRAVENOUS | Status: DC | PRN
  Administered 2022-04-08: 140 ug/kg/min via INTRAVENOUS

## 2022-04-08 SURGICAL SUPPLY — 25 items

## 2022-04-08 NOTE — H&P (Addendum)
? ?GASTROENTEROLOGY PROCEDURE H&P NOTE  ? ?Primary Care Physician: ?Dema SeverinYork, Regina F, NP ? ? ? ?Reason for Procedure:   Epigastric ab pain, constipation ? ?Plan:    EGD/colonoscopy ? ?Patient is appropriate for endoscopic procedure(s) in the hospital setting. ? ?The nature of the procedure, as well as the risks, benefits, and alternatives were carefully and thoroughly reviewed with the patient. Ample time for discussion and questions allowed. The patient understood, was satisfied, and agreed to proceed.  ? ? ? ?HPI: ?Victor Huang is a 46 y.o. male who presents for EGD/colonoscopy for evaluation of epigastric ab pain and constipation .  Patient was most recently seen in the Gastroenterology Clinic on 04/04/22.  No interval change in medical history since that appointment. Please refer to that note for full details regarding GI history and clinical presentation.  ? ?Past Medical History:  ?Diagnosis Date  ? Anxiety disorder   ? Asthma   ? as a child  ? Autonomic dysreflexia   ? Cervical myelopathy (HCC) 06/22/2021  ? Depression   ? Diverticulosis   ? Essential (primary) hypertension   ? GERD (gastroesophageal reflux disease)   ? Headache   ? 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 Pneumonia   ? 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 Status post cervical spinal fusion 06/18/2021  ? ? ?Past Surgical History:  ?Procedure Laterality Date  ? POSTERIOR CERVICAL FUSION/FORAMINOTOMY N/A 06/18/2021  ? Procedure: POSTERIOR CERVICAL FUSION CERVICAL FIVE THROUGH SIX WITH TRIPLE WIRE TECHNIQUE FIXATION AND RIGHT ILIAC CREST BONE GRAFT HARVEST, LEFT CERVIACL SIX THROUGH SEVEN CERVICAL FORAMINAL LAMINOPLASTY;  Surgeon: Kerrin ChampagneNitka, [name] E, MD;  Location: [facility] OR;  Service: Orthopedics;  Laterality: N/A;  ? ? ?Prior to Admission medications   ?Medication Sig Start Date End Date Taking? Authorizing Provider  ?amitriptyline (ELAVIL) 75  MG tablet Take 1 tablet (75 mg total) by mouth at bedtime. 06/26/21  Yes Kerrin ChampagneNitka, [name] E, MD  ?bisacodyl (DULCOLAX) 5 MG EC tablet Take 1 tablet (5 mg total) by mouth daily as needed for moderate constipation. ?Patient taking differently: Take 5 mg by mouth daily. [facility] 01/31/22  Yes Kerrin ChampagneNitka, [name] E, MD  ?FLUoxetine (PROZAC) 20 MG capsule TAKE 1 CAPSULE BY MOUTH EVERY DAY ?Patient not taking: Reported on 04/04/2022 02/27/22   Genice RougeLovorn, Megan, MD  ?linaclotide Karlene Einstein(LINZESS) 290 MCG CAPS capsule Take 1 capsule (290 mcg total) by mouth daily before breakfast. 02/27/22  Yes Lovorn, Aundra MilletMegan, MD  ?morphine (MS CONTIN) 15 MG 12 hr tablet Take 1 tablet (15 mg total) by mouth every 12 (twelve) hours. 03/28/22  Yes Lovorn, Aundra MilletMegan, MD  ?oxyCODONE (ROXICODONE) 5 MG immediate release tablet Take 1 tablet (5 mg total) by mouth every 4 (four) hours as needed. ?Patient taking differently: Take 5 mg by mouth 4 (four) times daily as needed for moderate pain or severe pain. 03/28/22 03/28/23 Yes Lovorn, Aundra MilletMegan, MD  ?polyethylene glycol (MIRALAX) 17 g packet Take 17 g by mouth daily. 12/05/21  Yes Lovorn, Aundra MilletMegan, MD  ?pregabalin (LYRICA) 150 MG capsule Take 1 capsule (150 mg total) by mouth 4 (four) times daily. ?Patient taking differently: Take 150 mg by mouth 2 (two) times daily. 01/03/22  Yes Lovorn, Aundra MilletMegan, MD  ?tiZANidine (ZANAFLEX) 4 MG tablet Take 2 tablets (8 mg total) by mouth every 6 (six) hours. ?Patient taking differently: Take 4 mg by mouth in the morning, at noon, in the evening, and  at bedtime. 04/01/22  Yes Lovorn, Aundra Millet, MD  ?lamoTRIgine (LAMICTAL) 25 MG tablet For 5 days take one pill po qd, then take one pill po bid 02/13/22   Sater, Pearletha Furl, MD  ?metoprolol succinate (TOPROL XL) 25 MG 24 hr tablet Take 1 tablet (25 mg total) by mouth daily as needed (palpations). ?Patient not taking: Reported on 04/04/2022 10/04/21   Revankar, Aundra Dubin, MD  ?metoprolol tartrate (LOPRESSOR) 25 MG tablet TAKE 0.5 TABLETS (12.5 MG TOTAL) BY MOUTH 2 (TWO) TIMES  DAILY AS NEEDED (FOR FAST HEART RATE-). 03/25/22   Genice Rouge, MD  ?nitroGLYCERIN (NITROSTAT) 0.4 MG SL tablet Place 1 tablet (0.4 mg total) under the tongue every 15 (fifteen) minutes as needed (for autonomic dysreflexia- max 3 tab/day). 03/28/22   Lovorn, Aundra Millet, MD  ?omeprazole (PRILOSEC) 40 MG capsule Take 1 capsule (40 mg total) by mouth daily. 04/04/22   Imogene Burn, MD  ? ? ?Current Facility-Administered Medications  ?Medication Dose Route Frequency Provider Last Rate Last Admin  ? 0.9 %  sodium chloride infusion   Intravenous Continuous Imogene Burn, MD      ? lactated ringers infusion   Intravenous Continuous Imogene Burn, MD 10 mL/hr at 04/08/22 0725 New Bag at 04/08/22 0725  ? ? ?Allergies as of 04/04/2022 - Review Complete 04/04/2022  ?Allergen Reaction Noted  ? Ibuprofen Rash 02/01/2021  ? ? ?Family History  ?Problem Relation Age of Onset  ? Hypertension Mother   ? Diabetes Mother   ? Cervical cancer Mother   ? Heart attack Father   ? Asthma Maternal Grandmother   ? Food Allergy Son   ? Seizures Son   ? Allergies Son   ? Colitis Son   ? GER disease Son   ? Irritable bowel syndrome Son   ? Asthma Son   ? Food Allergy Son   ? Allergies Son   ? Other Son   ?     eosinophilic esophagitis  ? Colitis Son   ? GER disease Son   ? Asthma Son   ? Colon cancer Neg Hx   ? Esophageal cancer Neg Hx   ? Stomach cancer Neg Hx   ? Pancreatic cancer Neg Hx   ? Colon polyps Neg Hx   ? ? ?Social History  ? ?Socioeconomic History  ? Marital status: Married  ?  Spouse name: Not on file  ? Number of children: 2  ? Years of education: Not on file  ? Highest education level: Not on file  ?Occupational History  ? Not on file  ?Tobacco Use  ? Smoking status: Every Day  ?  Packs/day: 1.00  ?  Types: Cigarettes  ?  Start date: 01/01/1987  ? Smokeless tobacco: Never  ?Vaping Use  ? Vaping Use: Never used  ?Substance and Sexual Activity  ? Alcohol use: Never  ?  Comment: no beer in over 15 years  ? Drug use: Not Currently  ?  Sexual activity: Yes  ?Other Topics Concern  ? Not on file  ?Social History Narrative  ? Not on file  ? ?Social Determinants of Health  ? ?Financial Resource Strain: Not on file  ?Food Insecurity: Not on file  ?Transportation Needs: Not on file  ?Physical Activity: Not on file  ?Stress: Not on file  ?Social Connections: Not on file  ?Intimate Partner Violence: Not on file  ? ? ?Physical Exam: ?Vital signs in last 24 hours: ?BP (!) 145/88   Pulse 96  Temp 97.9 ?F (36.6 ?C) (Temporal)   Resp 19   Ht 5' 9.5" (1.765 m)   Wt 89.4 kg   SpO2 100%   BMI 28.67 kg/m?  ?GEN: NAD ?EYE: Sclerae anicteric ?ENT: MMM ?CV: Non-tachycardic ?Pulm: No increased WOB ?GI: Soft ?NEURO:  Alert & Oriented ? ? ?Eulah Pont, MD ?[facility] ? ? ?04/08/2022 7:45 AM ? ?

## 2022-04-08 NOTE — Transfer of Care (Signed)
Immediate Anesthesia Transfer of Care Note ? ?Patient: Victor Huang ? ?Procedure(s) Performed: COLONOSCOPY WITH PROPOFOL ?ESOPHAGOGASTRODUODENOSCOPY (EGD) WITH PROPOFOL ?BIOPSY ?POLYPECTOMY ? ?Patient Location: PACU and Endoscopy Unit ? ?Anesthesia Type:MAC ? ?Level of Consciousness: awake, alert , oriented and patient cooperative ? ?Airway & Oxygen Therapy: Patient Spontanous Breathing and Patient connected to face mask oxygen ? ?Post-op Assessment: Report given to RN, Post -op Vital signs reviewed and stable and Patient moving all extremities ? ?Post vital signs: Reviewed and stable ? ?Last Vitals:  ?Vitals Value Taken Time  ?BP 140/99 04/08/22 0841  ?Temp    ?Pulse 78 04/08/22 0842  ?Resp 13 04/08/22 0842  ?SpO2 100 % 04/08/22 0842  ?Vitals shown include unvalidated device data. ? ?Last Pain:  ?Vitals:  ? 04/08/22 0717  ?TempSrc: Temporal  ?PainSc: 2   ?   ? ?  ? ?Complications: No notable events documented. ?

## 2022-04-08 NOTE — Telephone Encounter (Signed)
5/10 340 Per Dr. Derek Mound request, pt has been scheduled for 1-2 mo f/u on 5/10 @ 340pm. Appt reminder has been mailed. Appt will reflect on hosp AVS upon hosp discharge for pt future reference. ? ?

## 2022-04-08 NOTE — Discharge Instructions (Signed)
YOU HAD AN ENDOSCOPIC PROCEDURE TODAY: Refer to the procedure report and other information in the discharge instructions given to you for any specific questions about what was found during the examination. If this information does not answer your questions, please call [facility] office at [phone] to clarify.  ° °YOU SHOULD EXPECT: Some feelings of bloating in the abdomen. Passage of more gas than usual. Walking can help get rid of the air that was put into your GI tract during the procedure and reduce the bloating. If you had a lower endoscopy (such as a colonoscopy or flexible sigmoidoscopy) you may notice spotting of blood in your stool or on the toilet paper. Some abdominal soreness may be present for a day or two, also. ° °DIET: Your first meal following the procedure should be a light meal and then it is ok to progress to your normal diet. A half-sandwich or bowl of soup is an example of a good first meal. Heavy or fried foods are harder to digest and may make you feel nauseous or bloated. Drink plenty of fluids but you should avoid alcoholic beverages for 24 hours. If you had a esophageal dilation, please see attached instructions for diet.   ° °ACTIVITY: Your care partner should take you home directly after the procedure. You should plan to take it easy, moving slowly for the rest of the day. You can resume normal activity the day after the procedure however YOU SHOULD NOT DRIVE, use power tools, machinery or perform tasks that involve climbing or major physical exertion for 24 hours (because of the sedation medicines used during the test).  ° °SYMPTOMS TO REPORT IMMEDIATELY: °A gastroenterologist can be reached at any hour. Please call [phone]  for any of the following symptoms:  °Following lower endoscopy (colonoscopy, flexible sigmoidoscopy) °Excessive amounts of blood in the stool  °Significant tenderness, worsening of abdominal pains  °Swelling of the abdomen that is new, acute  °Fever of 100° or  higher  °Following upper endoscopy (EGD, EUS, ERCP, esophageal dilation) °Vomiting of blood or coffee ground material  °New, significant abdominal pain  °New, significant chest pain or pain under the shoulder blades  °Painful or persistently difficult swallowing  °New shortness of breath  °Black, tarry-looking or red, bloody stools ° °FOLLOW UP:  °If any biopsies were taken you will be contacted by phone or by letter within the next 1-3 weeks. Call [phone]  if you have not heard about the biopsies in 3 weeks.  °Please also call with any specific questions about appointments or follow up tests. ° °

## 2022-04-08 NOTE — Telephone Encounter (Signed)
-----   Message from Imogene Burn, MD sent at 04/08/2022  8:54 AM EDT ----- ?Hi Chett Taniguchi, let's bring him back for GI clinic appointment in 1-2 months. Thanks! ? ? ?

## 2022-04-08 NOTE — Anesthesia Postprocedure Evaluation (Signed)
Anesthesia Post Note ? ?Patient: Victor Huang ? ?Procedure(s) Performed: COLONOSCOPY WITH PROPOFOL ?ESOPHAGOGASTRODUODENOSCOPY (EGD) WITH PROPOFOL ?BIOPSY ?POLYPECTOMY ? ?  ? ?Patient location during evaluation: PACU ?Anesthesia Type: MAC ?Level of consciousness: awake and alert ?Pain management: pain level controlled ?Vital Signs Assessment: post-procedure vital signs reviewed and stable ?Respiratory status: spontaneous breathing, nonlabored ventilation, respiratory function stable and patient connected to nasal cannula oxygen ?Cardiovascular status: stable and blood pressure returned to baseline ?Postop Assessment: no apparent nausea or vomiting ?Anesthetic complications: no ? ? ?No notable events documented. ? ?Last Vitals:  ?Vitals:  ? 04/08/22 0900 04/08/22 0910  ?BP: 104/85   ?Pulse: 78 76  ?Resp: 13 13  ?Temp:    ?SpO2: 98% 99%  ?  ?Last Pain:  ?Vitals:  ? 04/08/22 0910  ?TempSrc:   ?PainSc: 0-No pain  ? ? ?  ?  ?  ?  ?  ?  ? ?Micaella Gitto ? ? ? ? ?

## 2022-04-08 NOTE — Op Note (Addendum)
[facility] ?Patient Name: Victor Huang ?Procedure Date: 04/08/2022 ?MRN: [id number] ?Attending MD: Georgian Co ,  ?Date of Birth: 1976-10-11 ?CSN: [id number] ?Age: 46 ?Admit Type: Outpatient ?Procedure:                Upper GI endoscopy ?Indications:              Epigastric abdominal pain ?Providers:                Adline Mango" Bradley Ferris  ?                          Grevelding, Technician, William Dalton, Technician ?Referring MD:             Imagene Riches ?Medicines:                Monitored Anesthesia Care ?Complications:            No immediate complications. ?Estimated Blood Loss:     Estimated blood loss was minimal. ?Procedure:                Pre-Anesthesia Assessment: ?                          - Prior to the procedure, a History and Physical  ?                          was performed, and patient medications and  ?                          allergies were reviewed. The patient's tolerance of  ?                          previous anesthesia was also reviewed. The risks  ?                          and benefits of the procedure and the sedation  ?                          options and risks were discussed with the patient.  ?                          All questions were answered, and informed consent  ?                          was obtained. Prior Anticoagulants: The patient has  ?                          taken no previous anticoagulant or antiplatelet  ?                          agents. ASA Grade Assessment: III - A patient with  ?                          severe systemic disease. After reviewing the risks  ?  and benefits, the patient was deemed in  ?                          satisfactory condition to undergo the procedure. ?                          After obtaining informed consent, the endoscope was  ?                          passed under direct vision. Throughout the  ?                          procedure, the patient's blood pressure,  pulse, and  ?                          oxygen saturations were monitored continuously. The  ?                          [id number]) Olympus endoscope was introduced  ?                          through the mouth, and advanced to the second part  ?                          of duodenum. The upper GI endoscopy was  ?                          accomplished without difficulty. The patient  ?                          tolerated the procedure well. ?Scope In: ?Scope Out: ?Findings: ?     White nummular lesions were noted in the middle third of the esophagus.  ?     Biopsies were taken with a cold forceps for histology. ?     Multiple dispersed erosions with stigmata of recent bleeding were found  ?     in the gastric antrum. Biopsies were taken with a cold forceps for  ?     histology. ?     The examined duodenum was normal. Biopsies were taken with a cold  ?     forceps for histology. ?Impression:               - White nummular lesions in esophageal mucosa.  ?                          Biopsied. ?                          - Erosive gastropathy with stigmata of recent  ?                          bleeding. Biopsied. ?                          - Normal examined duodenum. Biopsied. ?Moderate Sedation: ?     Not Applicable - Patient had care per Anesthesia. ?  Recommendation:           - Await pathology results. ?                          - Use Prilosec (omeprazole) 40 mg PO BID for 8  ?                          weeks, then decrease to daily. ?                          - Perform a colonoscopy today. ?Procedure Code(s):        --- Professional --- ?                          [id number], Esophagogastroduodenoscopy, flexible,  ?                          transoral; with biopsy, single or multiple ?Diagnosis Code(s):        --- Professional --- ?                          K92.2, Gastrointestinal hemorrhage, unspecified ?                          R10.13, Epigastric pain ?CPT copyright 2019 [employer]. All rights  reserved. ?The codes documented in this report are preliminary and upon coder review may  ?be revised to meet current compliance requirements. ?Georgian Co,  ?04/08/2022 8:44:07 AM ?Number of Addenda: 0 ?

## 2022-04-08 NOTE — Op Note (Addendum)
[facility] ?Patient Name: Victor Huang ?Procedure Date: 04/08/2022 ?MRN: [id number] ?Attending MD: Georgian Co ,  ?Date of Birth: 02-Sep-1976 ?CSN: [id number] ?Age: 46 ?Admit Type: Outpatient ?Procedure:                Colonoscopy ?Indications:              Epigastric abdominal pain, Constipation ?Providers:                Adline Mango" Bradley Ferris  ?                          Grevelding, Technician ?Referring MD:             Imagene Riches ?Medicines:                Monitored Anesthesia Care ?Complications:            No immediate complications. ?Estimated Blood Loss:     Estimated blood loss was minimal. ?Procedure:                Pre-Anesthesia Assessment: ?                          - Prior to the procedure, a History and Physical  ?                          was performed, and patient medications and  ?                          allergies were reviewed. The patient's tolerance of  ?                          previous anesthesia was also reviewed. The risks  ?                          and benefits of the procedure and the sedation  ?                          options and risks were discussed with the patient.  ?                          All questions were answered, and informed consent  ?                          was obtained. Prior Anticoagulants: The patient has  ?                          taken no previous anticoagulant or antiplatelet  ?                          agents. ASA Grade Assessment: III - A patient with  ?                          severe systemic disease. After reviewing the risks  ?  and benefits, the patient was deemed in  ?                          satisfactory condition to undergo the procedure. ?                          After obtaining informed consent, the colonoscope  ?                          was passed under direct vision. Throughout the  ?                          procedure, the patient's blood pressure, pulse, and  ?                           oxygen saturations were monitored continuously. The  ?                          [id number] ([id number] Olympus colonoscope was  ?                          introduced through the anus and advanced to the the  ?                          terminal ileum. The colonoscopy was performed  ?                          without difficulty. The patient tolerated the  ?                          procedure well. The quality of the bowel  ?                          preparation was good. The terminal ileum, ileocecal  ?                          valve, appendiceal orifice, and rectum were  ?                          photographed. ?Scope In: ?Scope Out: ?Findings: ?     The terminal ileum appeared normal. ?     A 3 mm polyp was found in the ascending colon. The polyp was sessile.  ?     The polyp was removed with a cold snare. Resection and retrieval were  ?     complete. ?     Non-bleeding internal hemorrhoids were found during retroflexion. ?Impression:               - The examined portion of the ileum was normal. ?                          - One 3 mm polyp in the ascending colon, removed  ?                          with a cold snare. Resected  and retrieved. ?                          - Non-bleeding internal hemorrhoids. ?Moderate Sedation: ?     Not Applicable - Patient had care per Anesthesia. ?Recommendation:           - Discharge patient to home (with escort). ?                          - Await pathology results. ?                          - Return to GI clinic in 1-2 months. ?                          - The findings and recommendations were discussed  ?                          with the patient. ?Procedure Code(s):        --- Professional --- ?                          [id number], Colonoscopy, flexible; with removal of  ?                          tumor(s), polyp(s), or other lesion(s) by snare  ?                          technique ?Diagnosis Code(s):        --- Professional --- ?                          [id number], Other  hemorrhoids ?                          K63.5, Polyp of colon ?                          R10.13, Epigastric pain ?                          K59.00, Constipation, unspecified ?CPT copyright 2019 American Medical Association. All rights reserved. ?The codes documented in this report are preliminary and upon coder review may  ?be revised to meet current compliance requirements. ?Georgian Co,  ?04/08/2022 8:54:22 AM ?Number of Addenda: 0 ?

## 2022-04-09 ENCOUNTER — Encounter (HOSPITAL_COMMUNITY): Payer: Self-pay | Admitting: Internal Medicine

## 2022-04-10 LAB — SURGICAL PATHOLOGY

## 2022-04-15 ENCOUNTER — Encounter: Payer: Self-pay | Admitting: Physical Therapy

## 2022-04-15 ENCOUNTER — Ambulatory Visit: Payer: Medicaid Other | Attending: Internal Medicine | Admitting: Physical Therapy

## 2022-04-15 DIAGNOSIS — R279 Unspecified lack of coordination: Secondary | ICD-10-CM | POA: Insufficient documentation

## 2022-04-15 DIAGNOSIS — M6281 Muscle weakness (generalized): Secondary | ICD-10-CM | POA: Insufficient documentation

## 2022-04-15 DIAGNOSIS — R293 Abnormal posture: Secondary | ICD-10-CM | POA: Insufficient documentation

## 2022-04-15 DIAGNOSIS — K5900 Constipation, unspecified: Secondary | ICD-10-CM | POA: Diagnosis present

## 2022-04-15 DIAGNOSIS — M62838 Other muscle spasm: Secondary | ICD-10-CM | POA: Insufficient documentation

## 2022-04-15 DIAGNOSIS — M6289 Other specified disorders of muscle: Secondary | ICD-10-CM | POA: Insufficient documentation

## 2022-04-15 NOTE — Patient Instructions (Signed)
?Types of Fiber ? ?There are two main types of fiber:  insoluble and soluble.  Both of these types can prevent and relieve constipation and diarrhea, although some people find one or the other to be more easily digested.  This handout details information about both types of fiber. ? ?Insoluble Fiber ? ?     Functions of Insoluble Fiber ?moves bulk through the intestines  ?controls and balances the pH (acidity) in the intestines ? ?     Benefits of Insoluble Fiber ?promotes regular bowel movement and prevents constipation  ?removes fecal waste through colon in less time  ?keeps an optimal pH in intestines to prevent microbes from producing cancer substances, therefore preventing colon cancer  ? ?     Food Sources of Insoluble Fiber ?whole-wheat products  ?wheat bran ?miller?s bran? ?corn bran  ?flax seed or other seeds ?vegetables such as green beans, broccoli, cauliflower and potato skins  ?fruit skins and root vegetable skins  ?popcorn ?brown rice ? ?Soluble Fiber ? ?     Functions of Soluble Fiber  ?holds water in the colon to bulk and soften the stool ?prolongs stomach emptying time so that sugar is released and absorbed more slowly  ? ?     Benefits of Soluble Fiber ?lowers total cholesterol and LDL cholesterol (the bad cholesterol) therefore reducing the risk of heart disease  ?regulates blood sugar for people with diabetes  ? ?    Food Sources of Soluble Fiber ?oat/oat bran ?dried beans and peas  ?nuts  ?barley  ?flax seed or other seeds ?fruits such as oranges, pears, peaches, and apples  ?vegetables such as carrots  ?psyllium husk  ?Prunes ? ?About Constipation ? ?Constipation Overview ?Constipation is the most common gastrointestinal complaint -- about 4 million Americans experience constipation and make 2.5 million physician visits a year to get help for the problem.  Constipation can occur when the colon absorbs too much water, the colon?s muscle contraction is slow or sluggish, and/or there is delayed  transit time through the colon.  The result is stool that is hard and dry.  Indicators of constipation include straining during bowel movements greater than 25% of the time, having fewer than three bowel movements per week, and/or the feeling of incomplete evacuation. ? ?There are established guidelines ([place] II ) for defining constipation. A person needs to have two or more of the following symptoms for at least 12 weeks (not necessarily consecutive) in the preceding 12 months: ?Straining in  greater than 25% of bowel movements ?Lumpy or hard stools in greater than 25% of bowel movements ?Sensation of incomplete emptying in greater than 25% of bowel movements ?Sensation of anorectal obstruction/blockade in greater than 25% of bowel movements ?Manual maneuvers to help empty greater than 25% of bowel movements (e.g., digital evacuation, support of the pelvic floor)  ?Less than  3 bowel movements/week ?Loose stools are not present, and criteria for irritable bowel syndrome are insufficient ?Common Causes of Constipation ?lack of fiber in your diet ?lack of physical activity ?medications, including iron and calcium supplements  ?dairy intake ?dehydration ?abuse of laxatives ?travel ?Irritable Bowel Syndrome ?pregnancy ?luteal phase of menstruation (after ovulation and before menses) ?colorectal problems ?intestinal dysfunction ? ?Treating Constipation ? ?There are several ways of treating constipation, including changes to diet and exercise, use of laxatives, adjustments to the pelvic floor, and scheduled toileting.  These treatments include: ?increasing fiber and fluids in the diet  ?increasing physical activity ?learning muscle coordination  ?learning  proper toileting techniques and toileting modifications  ?designing and sticking  to a toileting schedule ? ? ? ?

## 2022-04-15 NOTE — Addendum Note (Signed)
Addended by: Barbaraann Faster on: 04/15/2022 11:32 AM ? ? Modules accepted: Orders ? ?

## 2022-04-15 NOTE — Therapy (Signed)
?OUTPATIENT PHYSICAL THERAPY MALE PELVIC EVALUATION ? ? ?Patient Name: Victor Huang ?MRN: [id number] ?DOB:1976/11/16, 46 y.o., male ?Today's Date: 04/15/2022 ? ? PT End of Session - 04/15/22 1116   ? ? Visit Number 1   ? Date for PT Re-Evaluation 07/15/22   ? Authorization Type medicaid [facility]   ? PT Start Time 0800   ? PT Stop Time [phone]   ? PT Time Calculation (min) 43 min   ? Activity Tolerance Patient tolerated treatment well;Treatment limited secondary to medical complications (Comment)   ? Behavior During Therapy [facility] for tasks assessed/performed   ? ?  ?  ? ?  ? ? ?Past Medical History:  ?Diagnosis Date  ? Anxiety disorder   ? Asthma   ? as a child  ? Autonomic dysreflexia   ? Cervical myelopathy (HCC) 06/22/2021  ? Depression   ? Diverticulosis   ? Essential (primary) hypertension   ? GERD (gastroesophageal reflux disease)   ? Headache   ? 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 Pneumonia   ? 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 Status post cervical spinal fusion 06/18/2021  ? ?Past Surgical History:  ?Procedure Laterality Date  ? BIOPSY  04/08/2022  ? Procedure: BIOPSY;  Surgeon: Imogene Burn, MD;  Location: [facility];  Service: Gastroenterology;;  ? COLONOSCOPY WITH PROPOFOL N/A 04/08/2022  ? Procedure: COLONOSCOPY WITH PROPOFOL;  Surgeon: Imogene Burn, MD;  Location: [facility];  Service: Gastroenterology;  Laterality: N/A;  ? ESOPHAGOGASTRODUODENOSCOPY (EGD) WITH PROPOFOL N/A 04/08/2022  ? Procedure: ESOPHAGOGASTRODUODENOSCOPY (EGD) WITH PROPOFOL;  Surgeon: Imogene Burn, MD;  Location: [facility];  Service: Gastroenterology;  Laterality: N/A;  ? POLYPECTOMY  04/08/2022  ? Procedure: POLYPECTOMY;  Surgeon: Imogene Burn, MD;  Location: [facility];  Service: Gastroenterology;;  ? POSTERIOR CERVICAL FUSION/FORAMINOTOMY N/A 06/18/2021  ? Procedure: POSTERIOR CERVICAL FUSION CERVICAL FIVE  THROUGH SIX WITH TRIPLE WIRE TECHNIQUE FIXATION AND RIGHT ILIAC CREST BONE GRAFT HARVEST, LEFT CERVIACL SIX THROUGH SEVEN CERVICAL FORAMINAL LAMINOPLASTY;  Surgeon: Kerrin Champagne, MD;  Location: [facility];  Service: Orthopedics;  Laterality: N/A;  ? ?Patient Active Problem List  ? Diagnosis Date Noted  ? Neurogenic bowel 02/04/2022  ? Acute incomplete quadriplegia (HCC) 11/02/2021  ? Spasticity 11/02/2021  ? Chronic pain syndrome 11/02/2021  ? Chest pain of uncertain etiology 10/04/2021  ? Palpitations 10/04/2021  ? Cigarette smoker 10/04/2021  ? Anxiety disorder 09/25/2021  ? Asthma 09/25/2021  ? Depression 09/25/2021  ? Essential (primary) hypertension 09/25/2021  ? GERD (gastroesophageal reflux disease) 09/25/2021  ? Headache 09/25/2021  ? Pneumonia 09/25/2021  ? Cervical myelopathy (HCC) 06/22/2021  ? 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  Class: Chronic  ? 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  Class: Chronic  ? Status post cervical spinal fusion 06/18/2021  ? ? ?PCP: Dema Severin, NP ? ?REFERRING PROVIDER: Imogene Burn, MD ? ?REFERRING DIAG: K59.00 (ICD-10-CM) - Constipation, unspecified constipation type ?M62.89 (ICD-10-CM) - Pelvic floor dysfunction ? ? ?THERAPY DIAG:  ?Constipation, unspecified constipation type ? ?Pelvic floor dysfunction ? ?ONSET DATE: life long constipation, worse since SCI June 2022 C5 ? ?SUBJECTIVE:                                                                                                                                                                                          ? ?  SUBJECTIVE STATEMENT: ?Chronic constipation, has severe AD status post SCI. Does have small sensation to void but not always.  ?Fluid intake: 2-3 bottles of water ?Patient confirms identification and approves PT to assess pelvic floor and treatment Yes ? ? ?PAIN:  ?Are you having pain? No Has AD and reports he does not have pain usually but pressure  sometimes at abdomen. Sometimes does have pain at neck.   ? ? ?PRECAUTIONS: Other: SCI June 2022 C5,  ? ?WEIGHT BEARING RESTRICTIONS No ? ?FALLS:  ?Has patient fallen in last 6 months? No ? ?LIVING ENVIRONMENT: ?Lives with: lives with their family ?Lives in: House/apartment ? ? ? ?OCCUPATION: not working ? ?PLOF: Independent with basic ADLs ? ?PATIENT GOALS to have more regular BMs ? ?PERTINENT HISTORY:  ? He has constant right upper arm numbness and intermittent worsening right arm numbness, right leg numbness and sometimes left sided.   The intermittent numbness will be neck down.   He has mild right arm weakness.  Using the right arm sets off the autonomic dysreflexia.  Pain also triggers the dysreflexia.  ?MRI of the cervical spine 06/22/2021 shows an acute hemorrhagic focus within the spinal cord towards the right adjacent to C5.Marland Kitchen  C4-C6 ACDF. 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 ?Sexual abuse: No ? ?BOWEL MOVEMENT ?Pain with bowel movement: Yes sometimes ?Type of bowel movement:Type (Bristol Stool Scale) 7, Frequency if only going once or twice per day, Strain Yes, and Splinting No but does try to move around to help empty ?Fully empty rectum: No ?Leakage: Yes: does wear depends daily and at night ?Pads: Yes:   ?Fiber supplement: Yes: Linzess, Miralax, colace and occasionally uses a suppository. ? ?URINATION: further questions to be discussed   ?Pain with urination: No ?Fully empty bladder:  ?Stream:  ?Urgency:  ?Frequency:  ?Leakage:  ?Pads: Yes: depends for bowel use ? ?INTERCOURSE: to be discussed  ?Pain with intercourse:  ?Climax:  ?Ejaculation:  ? ? ? ? ?OBJECTIVE:  ? ?DIAGNOSTIC FINDINGS:  ? ? ?COGNITION: ? Overall cognitive status: Within functional limits for tasks assessed   ?  ?SENSATION: ? Light touch: Deficits He has constant right upper arm numbness and intermittent worsening right arm numbness, right leg numbness  and sometimes left sided.   The intermittent numbness will be neck down.   He has mild right arm weakness.  ? Proprioception: Deficits   ? ?MUSCLE LENGTH: ?WFL ? ? ?GAIT: ?Distance walked: 250' ?Assistive device utilized: None ?Level of assistance: Complete Independence ?Comments: WFL but limited tolerance due to AD ? ?POSTURE:  ?Mildly rounded shoulders ? ?LUMBARAROM/PROM ? ?Not formally assessed as pt limited this session due to AD symptoms ? ?LE AROM/PROM: ? ?WFL bil LE ? ?LE MMT: ? ?Not formally assessed however pt demonstrated fair gait mechanics and able to transfer to/from mat table unassisted and without compensatory strategies  ? ?PELVIC MMT: pt deferred  ?  ?MMT  ?04/15/2022  ?Internal Anal Sphincter   ?External Anal Sphincter   ?Puborectalis   ?Diastasis Recti   ?(Blank rows = not tested) ? ?PALPATION: ?GENERAL no pain with palpation however pt having AD symptoms and limited in mobility and tolerance to touch during eval ? ?            External Perineal Exam deferred ? ?            Internal Pelvic Floor deferred ? ?TONE: ?deferred ? ?TODAY'S TREATMENT  ?  EVAL 04/15/2022: Examination completed, findings reviewed, pt educated on POC, voiding and breathing mechanics and constipation handout given. Pt motivated to participate in PT and agreeable to attempt recommendations.  ?  ? ? ?PATIENT EDUCATION:  ?Education details: voiding and breathing mechanics; constipation ?Person educated: Patient ?Education method: Explanation, Demonstration, Tactile cues, Verbal cues, and Handouts ?Education comprehension: verbalized understanding and returned demonstration ? ? ?HOME EXERCISE PROGRAM: ?Voiding mechanics, breathing mechanics ? ?ASSESSMENT: ? ?CLINICAL IMPRESSION: ?Patient is a 46 y.o. male  who was seen today for physical therapy evaluation and treatment for chronic constipation. Pt presents to clinic with chronic constipation made worse since SCI June 2022 at C5. Pt has AD frequently and limited with this during  evaluation. Pt BP taken to be 130/80 heart rate 91. Per pt and spouse, this is his regular readings. But spent eval in reclined positioned on mat table for comfort. Pt educated on voiding and breathing mechanics,

## 2022-04-16 ENCOUNTER — Encounter: Payer: Self-pay | Admitting: Physical Medicine and Rehabilitation

## 2022-04-19 ENCOUNTER — Other Ambulatory Visit: Payer: Self-pay | Admitting: Physical Medicine and Rehabilitation

## 2022-04-20 ENCOUNTER — Other Ambulatory Visit: Payer: Self-pay | Admitting: Physical Medicine and Rehabilitation

## 2022-04-21 ENCOUNTER — Encounter: Payer: Self-pay | Admitting: Physical Medicine and Rehabilitation

## 2022-04-21 MED ORDER — OXYCODONE HCL 5 MG PO TABS: 5.0000 mg | ORAL_TABLET | ORAL | 0 refills | Status: DC | PRN

## 2022-04-21 MED ORDER — MORPHINE SULFATE ER 15 MG PO TBCR: 15.0000 mg | EXTENDED_RELEASE_TABLET | Freq: Two times a day (BID) | ORAL | 0 refills | Status: DC

## 2022-04-26 NOTE — Telephone Encounter (Signed)
Called [facility] about ear pressure and poking Sx's- which sounds like nerve pain which can be set off by inflammation/infection. ?Asked him to go to urgent care to look for ear infection  ?

## 2022-05-08 ENCOUNTER — Telehealth (INDEPENDENT_AMBULATORY_CARE_PROVIDER_SITE_OTHER): Payer: Medicaid Other | Admitting: Internal Medicine

## 2022-05-08 ENCOUNTER — Encounter: Payer: Self-pay | Admitting: Physical Medicine and Rehabilitation

## 2022-05-08 ENCOUNTER — Encounter: Payer: Self-pay | Admitting: Internal Medicine

## 2022-05-08 VITALS — BP 130/81 | HR 105

## 2022-05-08 DIAGNOSIS — K219 Gastro-esophageal reflux disease without esophagitis: Secondary | ICD-10-CM | POA: Diagnosis not present

## 2022-05-08 DIAGNOSIS — K5903 Drug induced constipation: Secondary | ICD-10-CM | POA: Diagnosis not present

## 2022-05-08 DIAGNOSIS — K2960 Other gastritis without bleeding: Secondary | ICD-10-CM

## 2022-05-08 DIAGNOSIS — T402X5A Adverse effect of other opioids, initial encounter: Secondary | ICD-10-CM

## 2022-05-08 DIAGNOSIS — R1013 Epigastric pain: Secondary | ICD-10-CM

## 2022-05-08 MED ORDER — NALOXEGOL OXALATE 25 MG PO TABS: 25.0000 mg | ORAL_TABLET | Freq: Every day | ORAL | 3 refills | Status: DC

## 2022-05-08 NOTE — Patient Instructions (Addendum)
If you are age 44 or younger, your body mass index should be between 19-25. Your There is no height or weight on file to calculate BMI. If this is out of the aformentioned range listed, please consider follow up with your Primary Care Provider.  ?________________________________________________________ ? ?The [facility] providers would like to encourage you to use [facility] to communicate with providers for non-urgent requests or questions.  Due to long hold times on the telephone, sending your provider a message by [facility] may be a faster and more efficient way to get a response.  Please allow 48 business hours for a response.  Please remember that this is for non-urgent requests.  ?_______________________________________________________ ? ?STOP Linzess ? ?START Movantik 25 mg 1 tablet every morning ? ?You have been scheduled to follow up with Dr. Leonides Schanz on July 08, 2022 at 3:40 pm  ?

## 2022-05-08 NOTE — Progress Notes (Addendum)
? ?Chief Complaint: Abdominal pain, constipation ? ?HPI : 46 year old male with history of GERD, headache, autonomic dysreflexia presents for follow up of abdominal pain and constipation ? ?Patient states that ever since his cervical neck fusion surgery that was performed in 05/2021, he has continued to have issues with abdominal distention.  He has struggled with constipation issues.  If he does not have a bowel movement, this can cause severe dysautonomia reactions in the patient.  This usually manifests as fluctuations in his blood pressure.  Patient is currently on Linzess to 290 mcg daily, Dulcolax, MiraLAX along with suppositories and enemas as needed.  Unfortunately when he takes laxatives, he will sometimes have fecal incontinence due to lack of sensation of stool coming out.  Denies blood in stools.  Endorses epigastric abdominal pain that gets better after he has a bowel movement.  Eating seems to make his pain worse.  Denies prior colonoscopy or EGD.  He has gained about 55 pounds since 07/2021.  He was told recently that he has an umbilical hernia.  He does take oxycodone 5 to 6 tablets/day along with morphine ER 15 mg twice daily.  Denies dysphagia.  Has had issues with GERD ever since his surgery.  Endorses nausea. Denies vomiting. Her children have EoE along with food allergies. Denies other fam hx of GI cancers.  He has difficulty with extended physical activity, and has been trying to be better about drinking more water ? ?Interval History: He is now going to the bathroom about 3 times per day. He has been putting in more effort to try to get his BMs moving. He is trying to drink more water. He is using a suppository or enema to help induce a BM. He has also been using digital stimulation to help with BMs. He is taking about 2-3 doses of Miralax per day. He does feel like there has been decreased sensation in the inside of the his anus. He is abl to feel sensation int the external skin around his  anus, but now over the last few weeks, he feels numbness. He is experiencing pain in the upper abdomen. The pain feels like electricity sensation. He is currently on Augmentin for an ear infection. He has started doing pelvic floor PT ? ?Current Outpatient Medications  ?Medication Sig Dispense Refill  ? amitriptyline (ELAVIL) 75 MG tablet Take 1 tablet (75 mg total) by mouth at bedtime. 30 tablet 3  ? bisacodyl (DULCOLAX) 5 MG EC tablet Take 1 tablet (5 mg total) by mouth daily as needed for moderate constipation. 30 tablet 0  ? FLUoxetine (PROZAC) 20 MG capsule TAKE 1 CAPSULE BY MOUTH EVERY DAY (Patient not taking: Reported on 04/04/2022) 90 capsule 2  ? metoprolol tartrate (LOPRESSOR) 25 MG tablet TAKE 0.5 TABLETS (12.5 MG TOTAL) BY MOUTH 2 (TWO) TIMES DAILY AS NEEDED (FOR FAST HEART RATE-). 30 tablet 1  ? morphine (MS CONTIN) 15 MG 12 hr tablet Take 1 tablet (15 mg total) by mouth every 12 (twelve) hours. 60 tablet 0  ? naloxegol oxalate (MOVANTIK) 25 MG TABS tablet Take 1 tablet (25 mg total) by mouth daily. 30 tablet 3  ? nitroGLYCERIN (NITROSTAT) 0.4 MG SL tablet Place 1 tablet (0.4 mg total) under the tongue every 15 (fifteen) minutes as needed (for autonomic dysreflexia- max 3 tab/day). 12 tablet 11  ? omeprazole (PRILOSEC) 40 MG capsule Take 1 capsule (40 mg total) by mouth 2 (two) times daily. Take for 8 weeks twice daily, then  drop down to daily 90 capsule 3  ? oxyCODONE (ROXICODONE) 5 MG immediate release tablet Take 1 tablet (5 mg total) by mouth every 4 (four) hours as needed. 180 tablet 0  ? polyethylene glycol (MIRALAX) 17 g packet Take 17 g by mouth daily. 28 each 5  ? pregabalin (LYRICA) 150 MG capsule Take 1 capsule (150 mg total) by mouth 4 (four) times daily. 120 capsule 5  ? tiZANidine (ZANAFLEX) 4 MG tablet Take 2 tablets (8 mg total) by mouth every 6 (six) hours. (Patient taking differently: Take 4 mg by mouth in the morning, at noon, in the evening, and at bedtime.) 720 tablet 1  ? ?No current  facility-administered medications for this visit.  ? ?Review of Systems: ?All systems reviewed and negative except where noted in HPI.  ? ?Physical Exam: ?Physical exam was not performed since this was conducted as a video visit. ? ?Labs 02/2022: CBC with elevated WBC of 21 (persistent over the last several months). CMP unremarkable. Lipase normal.  ? ?CTA head and neck 03/25/22: ?IMPRESSION: ?1.  No acute intracranial process. ?2.  No hemodynamically significant stenosis in the neck. ?3. No intracranial large vessel occlusion. Mild stenosis in the left ?V4 segment. No other significant intracranial stenosis. ?4. For findings in the chest, please see same-day CT chest abdomen ?pelvis. ? ?CTA C/A/P 03/25/22: ?IMPRESSION: ?1. No acute intrathoracic, abdominal, or pelvic pathology. No aortic ?dissection or aneurysm. ?2. No pulmonary artery embolus identified. ?3. Paraseptal emphysema. Diffuse hazy density throughout the lungs ?may represent atelectasis, edema, or related to chronic lung disease ?or pneumonitis. ?4. Faint ground-glass nodules versus confluence of interstitial ?markings/edema. These measure up to 9 mm in the left upper lobe. ?Attention on follow-up imaging recommended. ?5. Aortic Atherosclerosis (ICD10-I70.0) and Emphysema (ICD10-J43.9). ? ?EGD 04/08/22: ?- White nummular lesions in esophageal mucosa. Biopsied. ?- Erosive gastropathy with stigmata of recent bleeding. Biopsied. ?- Normal examined duodenum. Biopsied. ?Path: ?A. DUODENUM, BIOPSY:  ?- Duodenal mucosa with normal villous architecture.  ?- No villous atrophy or increased intraepithelial lymphocytes.  ?B. STOMACH, BIOPSY:  ?- Unremarkable antral and oxyntic mucosa.  ?- No Helicobacter pylori identified.  ?C. ESOPHAGUS, BIOPSY:  ?- Squamous mucosa with hyperemia.  ?- No eosinophilic esophagitis.  ?- PAS stain negative for fungus.  ? ?Colonoscopy 04/08/22: ?- The examined portion of the ileum was normal. ?- One 3 mm polyp in the ascending colon,  removed with a cold snare. Resected and ?retrieved. ?- Non-bleeding internal hemorrhoids. ?Path: ?D. COLON, ASCENDING POLYP:  ?- Benign colonic mucosa.  ?- Multiple additional levels examined ? ?ASSESSMENT AND PLAN: ?Constipation ?Epigastric abdominal pain ?Opioid dependence ?GERD ?Erosive gastritis ?Patient presents with constipation that has been on ever since his neck surgery in 2022.  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 Patient's last colonoscopy did not show any signs of overt obstruction. His constipation is improved with a combination of increase hydration, Miralax, enemas/suppositories, and digital stimulation. He is currently undergoing pelvic floor PT therapy. Will go ahead and start the patient on Movantik therapy to help with opioid induced constipation. Patient does describe some lack of appropriate sensation in the inside of his anus, which may be due to nerve injury. ?- Encourage 8 drinking cups of water per day ?- Continue to increase physical activity if possible ?- Continue omeprazole 40 mg BID, take 30 minutes before eating ?- Start Movantik 25 mg QD ?- Try  to avoid digital stimulation ?- Patient plans to reach out to neurology to inquire about numbness in his anal canal ?- Undergoing pelvic floor PT referral ?- RTC in 2 months ? ?Eulah Pont, MD ? ?I spent 41 minutes of time, including in depth chart review, independent review of results as outlined above, communicating results with the patient directly, face-to-face time with the patient, coordinating care, ordering studies and medications as appropriate, and documentation. ? ?

## 2022-05-12 ENCOUNTER — Other Ambulatory Visit: Payer: Self-pay

## 2022-05-12 ENCOUNTER — Encounter (HOSPITAL_COMMUNITY): Payer: Self-pay | Admitting: Emergency Medicine

## 2022-05-12 ENCOUNTER — Emergency Department (HOSPITAL_COMMUNITY): Payer: Medicaid Other

## 2022-05-12 ENCOUNTER — Emergency Department (HOSPITAL_COMMUNITY)
Admission: EM | Admit: 2022-05-12 | Discharge: 2022-05-12 | Disposition: A | Discharge status: Home / Self Care | Payer: Medicaid Other | Attending: Emergency Medicine | Admitting: Emergency Medicine

## 2022-05-12 DIAGNOSIS — M4802 Spinal stenosis, cervical region: Secondary | ICD-10-CM | POA: Insufficient documentation

## 2022-05-12 DIAGNOSIS — M542 Cervicalgia: Secondary | ICD-10-CM

## 2022-05-12 DIAGNOSIS — M5412 Radiculopathy, cervical region: Secondary | ICD-10-CM | POA: Insufficient documentation

## 2022-05-12 DIAGNOSIS — R200 Anesthesia of skin: Secondary | ICD-10-CM | POA: Diagnosis present

## 2022-05-12 DIAGNOSIS — R202 Paresthesia of skin: Secondary | ICD-10-CM

## 2022-05-12 MED ORDER — LORAZEPAM 1 MG PO TABS
1.0000 mg | ORAL_TABLET | Freq: Once | ORAL | Status: AC | PRN
  Administered 2022-05-12: 1 mg via ORAL
  Filled 2022-05-12: qty 1

## 2022-05-12 MED ORDER — OXYCODONE-ACETAMINOPHEN 5-325 MG PO TABS
1.0000 | ORAL_TABLET | Freq: Once | ORAL | Status: AC
  Administered 2022-05-12: 1 via ORAL
  Filled 2022-05-12: qty 1

## 2022-05-12 NOTE — Discharge Instructions (Signed)
You were seen today for increased numbness and pain in the right arm.  Your MRI shows no acute abnormality within the cervical spine.  At this time I recommend follow-up with your outpatient providers including your neurosurgeon. ?

## 2022-05-12 NOTE — ED Triage Notes (Signed)
Pt reports neck pain x 2 weeks with tingling to bilateral arms.  States R arm is worse.  Pt has history of autonomic dysreflexia and believes symptoms are related to R ear infection he had in April.   ?

## 2022-05-12 NOTE — ED Notes (Signed)
Pt to MRI

## 2022-05-12 NOTE — ED Provider Notes (Signed)
[facility] ?[facility] EMERGENCY DEPARTMENT ?Provider Note ? ? ?CSN: [id number] ?Arrival date & time: 05/12/22  1447 ? ?  ? ?History ? ?Chief Complaint  ?Patient presents with  ? Neck Pain  ? ? ?Victor Huang is a 46 y.o. male.  Patient presents to the emergency department with complaints of increasing right arm numbness and neck pain.  Patient Had posterior cervical fusion C5-C6 with triple wire technique fixation and right iliac crest bone graft harvest, left cervical 6 through 7 cervical foraminal laminoplasty in June 2022.  He reportedly had a complication during surgery which has caused tingling and numbness into the right arm and hand.  The patient also has autonomic dysreflexia. ?Over the past 2 weeks the patient states that his right arm numbness and pain in the right arm of increased significantly.  The patient states that he has also had numbness began to form in the left arm.  He states that he is having severe neck pain in the area of the surgery.  The patient feels that his pain may trigger his autonomic dysreflexia.  Past medical history significant for cervical fusion/foraminotomy, diverticulosis, cervical myelopathy, anxiety disorder, autonomic dysreflexia, depression ? ?HPI ? ?  ? ?Home Medications ?Prior to Admission medications   ?Medication Sig Start Date End Date Taking? Authorizing Provider  ?FLUoxetine (PROZAC) 20 MG capsule TAKE 1 CAPSULE BY MOUTH EVERY DAY ?Patient not taking: Reported on 04/04/2022 02/27/22   Genice Rouge, MD  ?amitriptyline (ELAVIL) 75 MG tablet Take 1 tablet (75 mg total) by mouth at bedtime. 06/26/21   Kerrin Champagne, MD  ?bisacodyl (DULCOLAX) 5 MG EC tablet Take 1 tablet (5 mg total) by mouth daily as needed for moderate constipation. 01/31/22   Kerrin Champagne, MD  ?metoprolol tartrate (LOPRESSOR) 25 MG tablet TAKE 0.5 TABLETS (12.5 MG TOTAL) BY MOUTH 2 (TWO) TIMES DAILY AS NEEDED (FOR FAST HEART RATE-). 03/25/22   Genice Rouge, MD  ?morphine (MS CONTIN) 15 MG 12  hr tablet Take 1 tablet (15 mg total) by mouth every 12 (twelve) hours. 04/21/22   Lovorn, Aundra Millet, MD  ?naloxegol oxalate (MOVANTIK) 25 MG TABS tablet Take 1 tablet (25 mg total) by mouth daily. 05/08/22   Imogene Burn, MD  ?nitroGLYCERIN (NITROSTAT) 0.4 MG SL tablet Place 1 tablet (0.4 mg total) under the tongue every 15 (fifteen) minutes as needed (for autonomic dysreflexia- max 3 tab/day). 03/28/22   Lovorn, Aundra Millet, MD  ?omeprazole (PRILOSEC) 40 MG capsule Take 1 capsule (40 mg total) by mouth 2 (two) times daily. Take for 8 weeks twice daily, then drop down to daily 04/08/22   Imogene Burn, MD  ?oxyCODONE (ROXICODONE) 5 MG immediate release tablet Take 1 tablet (5 mg total) by mouth every 4 (four) hours as needed. 04/21/22 04/21/23  Lovorn, Aundra Millet, MD  ?polyethylene glycol (MIRALAX) 17 g packet Take 17 g by mouth daily. 12/05/21   Lovorn, Aundra Millet, MD  ?pregabalin (LYRICA) 150 MG capsule Take 1 capsule (150 mg total) by mouth 4 (four) times daily. 01/03/22   Lovorn, Aundra Millet, MD  ?tiZANidine (ZANAFLEX) 4 MG tablet Take 2 tablets (8 mg total) by mouth every 6 (six) hours. ?Patient taking differently: Take 4 mg by mouth in the morning, at noon, in the evening, and at bedtime. 04/01/22   Genice Rouge, MD  ?   ? ?Allergies    ?Ibuprofen   ? ?Review of Systems   ?Review of Systems  ?Constitutional:  Negative for fever.  ?Respiratory:  Negative for shortness of breath.   ?Cardiovascular:  Negative for chest pain.  ?Gastrointestinal:  Negative for abdominal pain.  ?Genitourinary:  Negative for difficulty urinating.  ?Musculoskeletal:  Positive for arthralgias and neck pain.  ?Neurological:  Positive for weakness and numbness.  ? ?Physical Exam ?Updated Vital Signs ?BP (!) 123/97 (BP Location: Left Arm)   Pulse 80   Temp 97.9 ?F (36.6 ?C) (Oral)   Resp 16   SpO2 99%  ?Physical Exam ?Vitals and nursing note reviewed.  ?Constitutional:   ?   General: He is not in acute distress. ?HENT:  ?   Head: Normocephalic.  ?Eyes:  ?    Extraocular Movements: Extraocular movements intact.  ?   Conjunctiva/sclera: Conjunctivae normal.  ?   Pupils: Pupils are equal, round, and reactive to light.  ?Cardiovascular:  ?   Rate and Rhythm: Normal rate and regular rhythm.  ?   Pulses: Normal pulses.  ?   Heart sounds: Normal heart sounds.  ?Pulmonary:  ?   Effort: Pulmonary effort is normal.  ?   Breath sounds: Normal breath sounds.  ?Skin: ?   General: Skin is warm and dry.  ?Neurological:  ?   Mental Status: He is alert.  ?   Sensory: Sensory deficit present.  ?   Comments: CN II-VII, XI, XII intact ?Patient has decreased sensation in the C5, C6 nerve distribution on the right arm ?Strength grossly equal bilaterally ?Coordination intact.   ? ? ?ED Results / Procedures / Treatments   ?Labs ?(all labs ordered are listed, but only abnormal results are displayed) ?Labs Reviewed - No data to display ? ?EKG ?None ? ?Radiology ?MR Cervical Spine Wo Contrast ? ?Result Date: 05/12/2022 ?CLINICAL DATA:  Initial evaluation for acute neck pain. Prior surgery. EXAM: MRI CERVICAL SPINE WITHOUT CONTRAST TECHNIQUE: Multiplanar, multisequence MR imaging of the cervical spine was performed. No intravenous contrast was administered. COMPARISON:  MRI from 07/18/2021. FINDINGS: Alignment: Straightening of the normal cervical lordosis. No listhesis. Vertebrae: Prior ACDF at C4-5 and C5-6. Vertebral body height maintained. No acute or chronic fracture. Bone marrow signal intensity within normal limits. No worrisome osseous lesions or abnormal marrow edema. Cord: Small focus of susceptibility artifact involves the right dorsal cord at the level of C4-5, likely a small focus of chronic hemorrhage, stable (series 5, image 66). Otherwise normal signal and morphology. Posterior Fossa, vertebral arteries, paraspinal tissues: Craniocervical junction normal. Paraspinous soft tissues demonstrate no acute finding. Preserved flow voids seen within the vertebral arteries bilaterally. Disc  levels: C2-C3: Negative interspace.  Mild facet hypertrophy.  No stenosis. C3-C4: Disc bulge with endplate and uncovertebral spurring. Right-sided facet hypertrophy. No significant spinal stenosis. Mild left C4 foraminal stenosis, mildly progressed from prior. Right neural foramina remains patent. C4-C5:  Prior fusion.  No residual canal or foraminal stenosis. C5-C6:  Prior fusion.  No residual canal or foraminal stenosis. C6-C7: Disc bulge with endplate and uncovertebral spurring. No canal or foraminal stenosis. C7-T1: Left eccentric disc bulge with uncovertebral spurring. Prior left-sided posterior decompression and foraminotomy. No spinal stenosis. Residual mild left C8 foraminal stenosis, stable. Right neural foramina remains patent. Visualized upper cervical spine demonstrates no significant finding. IMPRESSION: 1. No acute abnormality within the cervical spine. 2. Postoperative changes from prior ACDF at C4-5 and C5-6 without residual or recurrent stenosis. 3. Disc bulge with uncovertebral spurring and facet hypertrophy at C3-4 with resultant mild left C4 foraminal stenosis, mildly progressed from prior. 4. Left eccentric disc bulge with uncovertebral spurring at  C7-T1 with resultant mild left C8 foraminal stenosis, stable. 5. Small focus of chronic hemorrhage within the right dorsal cord at C4-5, stable. Electronically Signed   By: Rise Mu M.D.   On: 05/12/2022 19:12   ? ?Procedures ?Procedures  ? ? ?Medications Ordered in ED ?Medications  ?oxyCODONE-acetaminophen (PERCOCET/ROXICET) 5-325 MG per tablet 1 tablet (has no administration in time range)  ?LORazepam (ATIVAN) tablet 1 mg (1 mg Oral Given 05/12/22 1812)  ? ? ?ED Course/ Medical Decision Making/ A&P ?  ?                        ?Medical Decision Making ?Amount and/or Complexity of Data Reviewed ?Radiology: ordered. ? ?Risk ?Prescription drug management. ? ? ?The patient presents with a chief complaint of increased paresthesias in the right  arm.  The patient has previous history of cervical spine surgery with known paresthesias in the right arm postoperatively.  Patient's chief concern is possible hardware failure. ? ?I ordered and interpreted imagin

## 2022-05-14 ENCOUNTER — Encounter: Payer: Self-pay | Admitting: Physical Medicine and Rehabilitation

## 2022-05-15 ENCOUNTER — Other Ambulatory Visit: Payer: Self-pay | Admitting: Physical Medicine and Rehabilitation

## 2022-05-18 ENCOUNTER — Other Ambulatory Visit: Payer: Self-pay | Admitting: Physical Medicine and Rehabilitation

## 2022-05-19 ENCOUNTER — Other Ambulatory Visit: Payer: Self-pay | Admitting: Physical Medicine and Rehabilitation

## 2022-05-20 ENCOUNTER — Ambulatory Visit: Payer: 59 | Admitting: Neurology

## 2022-05-20 ENCOUNTER — Telehealth: Payer: Self-pay

## 2022-05-20 MED ORDER — MORPHINE SULFATE ER 15 MG PO TBCR: 15.0000 mg | EXTENDED_RELEASE_TABLET | Freq: Two times a day (BID) | ORAL | 0 refills | Status: DC

## 2022-05-20 MED ORDER — OXYCODONE HCL 10 MG PO TABS: 10.0000 mg | ORAL_TABLET | ORAL | 0 refills | Status: DC | PRN

## 2022-05-20 NOTE — Telephone Encounter (Signed)
  Please see the MyChart message reply(ies) for my assessment and plan.    This patient gave consent for this Medical Advice Message and is aware that it may result in a bill to [employer], as well as the possibility of receiving a bill for a co-payment or deductible. They are an established patient, but are not seeking medical advice exclusively about a problem treated during an in person or video visit in the last seven days. I did not recommend an in person or video visit within seven days of my reply.    I spent a total of 25 minutes cumulative time within 7 days (today) through [employer].  Genice Rouge, MD    Discussed pain regimen- taking increased amount of Oxycodone to keep him out of AD/ER- and from trying to not call an ambulance due to severe HA/AD/high BP issues.   I agree the main issue is to keep him out of AD.  I think some of his pain is myofascial and since we are having to keep increasing pain meds, I think will try to do trigger point injections at his next visit- will try to get Prior auth for it.   Also will wait on Valium for now since increasing oxycodone from 5 mg 6x/day to Oxy 10 mg 4-5x/day- but asked him to try and stick to no more than 4 tabs/day if possible.   Explained need to wait on Valium since can potentiate pain meds, although I've found that it can really help reduce episodes of Autonomic dysreflexia. Will do next time we talk.   Asked pt to call me in 7 days to et me know how things going.

## 2022-05-20 NOTE — Telephone Encounter (Signed)
Patient called and stated he called several pharmacies and no one has Oxycodone 10 mg. The [facility] in [place] told him that they do have the Oxycodone 15 mg if the prescriber is willing to do that. Please advise

## 2022-05-21 MED ORDER — OXYCODONE HCL 10 MG PO TABS: 10.0000 mg | ORAL_TABLET | ORAL | 0 refills | Status: DC | PRN

## 2022-05-21 NOTE — Addendum Note (Signed)
Addended by: Genice Rouge on: 05/21/2022 12:56 PM   Modules accepted: Orders

## 2022-05-21 NOTE — Telephone Encounter (Signed)
Mr Go found the oxycodone at [facility] in [place]. (He called about 20 places!) I have added the [facility] to his pharmacy list. I have cancelled the Rx at [facility] for ox[facility]one.

## 2022-05-22 ENCOUNTER — Ambulatory Visit: Payer: 59 | Admitting: Cardiology

## 2022-05-23 ENCOUNTER — Other Ambulatory Visit: Payer: Self-pay | Admitting: Neurology

## 2022-06-14 ENCOUNTER — Other Ambulatory Visit: Payer: Self-pay

## 2022-06-14 DIAGNOSIS — K5790 Diverticulosis of intestine, part unspecified, without perforation or abscess without bleeding: Secondary | ICD-10-CM | POA: Insufficient documentation

## 2022-06-14 DIAGNOSIS — G904 Autonomic dysreflexia: Secondary | ICD-10-CM | POA: Insufficient documentation

## 2022-06-17 ENCOUNTER — Encounter
Payer: Medicaid Other | Attending: Physical Medicine and Rehabilitation | Admitting: Physical Medicine and Rehabilitation

## 2022-06-17 ENCOUNTER — Encounter: Payer: Self-pay | Admitting: Physical Medicine and Rehabilitation

## 2022-06-17 VITALS — BP 126/86 | HR 93 | Ht 69.5 in | Wt 199.4 lb

## 2022-06-17 DIAGNOSIS — Z5181 Encounter for therapeutic drug level monitoring: Secondary | ICD-10-CM | POA: Diagnosis present

## 2022-06-17 DIAGNOSIS — N319 Neuromuscular dysfunction of bladder, unspecified: Secondary | ICD-10-CM

## 2022-06-17 DIAGNOSIS — G8250 Quadriplegia, unspecified: Secondary | ICD-10-CM | POA: Insufficient documentation

## 2022-06-17 DIAGNOSIS — K592 Neurogenic bowel, not elsewhere classified: Secondary | ICD-10-CM | POA: Insufficient documentation

## 2022-06-17 DIAGNOSIS — R252 Cramp and spasm: Secondary | ICD-10-CM | POA: Insufficient documentation

## 2022-06-17 DIAGNOSIS — Z79891 Long term (current) use of opiate analgesic: Secondary | ICD-10-CM | POA: Insufficient documentation

## 2022-06-17 DIAGNOSIS — G904 Autonomic dysreflexia: Secondary | ICD-10-CM | POA: Insufficient documentation

## 2022-06-17 DIAGNOSIS — G894 Chronic pain syndrome: Secondary | ICD-10-CM | POA: Insufficient documentation

## 2022-06-17 HISTORY — DX: Neuromuscular dysfunction of bladder, unspecified: N31.9

## 2022-06-17 MED ORDER — LORAZEPAM 1 MG PO TABS: 1.0000 mg | ORAL_TABLET | ORAL | 0 refills | Status: AC

## 2022-06-17 MED ORDER — MORPHINE SULFATE ER 15 MG PO TBCR: 15.0000 mg | EXTENDED_RELEASE_TABLET | Freq: Two times a day (BID) | ORAL | 0 refills | Status: DC

## 2022-06-17 MED ORDER — OXYCODONE HCL 10 MG PO TABS: 10.0000 mg | ORAL_TABLET | ORAL | 0 refills | Status: DC | PRN

## 2022-06-17 NOTE — Progress Notes (Signed)
Subjective:    Patient ID: Victor Huang, male    DOB: October 25, 1976, 46 y.o.   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    and s/p C5/7 posterior cervical fusion by Dr Otelia Sergeant- 06/18/21.  He now has chronic nerve and chronic pain since  surgery as well. 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  Is a C2 R hemi/SCI/quadriplegia- ASIA D. Now depressed with sleep issues as a result and neurogenic bowel/incontinence.   Same thing going on- lots of stomach issues. With everything else, found a balance, but stomach pain/issues have a mind of it's own.   Was having accidents constantly at night.  However, started getting constipated again- Movantik (was on Linzess)- has stopped Movantik-   Was getting into more of a routine- but back to standstill with Movantik- 25 mg daily. Added to Linzess and wasn't a good combo.   Stopped it 2 weeks ago and took 1 week to get bowels back to going.   Yesterday, had spontaneous BM-  better overall in last week, but still not where he was initially.   Still has numbness in rectum- constant now.  Now also getting urinary issues as well-  Peeing wasn't an issue, but now, gets sensation, but has to push really hard to empty bladder- not constant stream- and has to push until feels empty.   Legs and hands- swelling- Cannot get rings off easily anymore.  Taking Lyrica 150 mg 2-3x/day- due to making him super sleepy.   Takes night time meds 7-8 pm- so when wakes up in AM, hurting really bad- wakes up 8-9 am, occ 2-3 am if pain wakes him up.     Putting up a above land pool-  Scooped a crater out of the back of neck.   Pain Inventory Average Pain 4 Pain Right Now 4 My pain is constant, sharp, burning, dull, stabbing, tingling, and aching  LOCATION OF PAIN  head, neck, shoulder, elbow, hand, fingers,  back, buttocks, hip  BOWEL Number of stools per week: 4x Oral laxative use Yes  Type of laxative Miralax, Linzess Enema or suppository use Yes  History of colostomy No  Incontinent Yes   BLADDER Normal In and out cath, frequency . Able to self cath  . Bladder incontinence No  Frequent urination No  Leakage with coughing No  Difficulty starting stream Yes  Incomplete bladder emptying Yes    Mobility walk without assistance how many minutes can you walk? 15 do you drive?  no  Function not employed: date last employed . I need assistance with the following:  dressing, bathing, toileting, meal prep, household duties, and shopping  Neuro/Psych bowel control problems weakness numbness tremor tingling spasms dizziness anxiety  Prior Studies Any changes since last visit?  no  Physicians involved in your care Any changes since last visit?  no   Family History  Problem Relation Age of Onset   Hypertension Mother    Diabetes Mother    Cervical cancer Mother    Heart attack Father    Asthma Maternal Grandmother    Food Allergy Son    Seizures Son    Allergies Son    Colitis Son    GER disease Son    Irritable bowel syndrome Son    Asthma Son    Food Allergy Son  Allergies Son    Other Son        eosinophilic esophagitis   Colitis Son    GER disease Son    Asthma Son    Colon cancer Neg Hx    Esophageal cancer Neg Hx    Stomach cancer Neg Hx    Pancreatic cancer Neg Hx    Colon polyps Neg Hx    Social History   Socioeconomic History   Marital status: Married    Spouse name: Not on file   Number of children: 2   Years of education: Not on file   Highest education level: Not on file  Occupational History   Not on file  Tobacco Use   Smoking status: Every Day    Packs/day: 1.00    Types: Cigarettes    Start date: 01/01/1987   Smokeless tobacco: Never  Vaping Use   Vaping Use: Never used  Substance and Sexual Activity   Alcohol use: Never     Comment: no beer in over 15 years   Drug use: Not Currently   Sexual activity: Yes  Other Topics Concern   Not on file  Social History Narrative   Not on file   Social Determinants of Health   Financial Resource Strain: Not on file  Food Insecurity: Not on file  Transportation Needs: Not on file  Physical Activity: Not on file  Stress: Not on file  Social Connections: Not on file   Past Surgical History:  Procedure Laterality Date   BIOPSY  04/08/2022   Procedure: BIOPSY;  Surgeon: Imogene Burn, MD;  Location: [facility];  Service: Gastroenterology;;   COLONOSCOPY WITH PROPOFOL N/A 04/08/2022   Procedure: COLONOSCOPY WITH PROPOFOL;  Surgeon: Imogene Burn, MD;  Location: [facility];  Service: Gastroenterology;  Laterality: N/A;   ESOPHAGOGASTRODUODENOSCOPY (EGD) WITH PROPOFOL N/A 04/08/2022   Procedure: ESOPHAGOGASTRODUODENOSCOPY (EGD) WITH PROPOFOL;  Surgeon: Imogene Burn, MD;  Location: [facility];  Service: Gastroenterology;  Laterality: N/A;   POLYPECTOMY  04/08/2022   Procedure: POLYPECTOMY;  Surgeon: Imogene Burn, MD;  Location: [facility];  Service: Gastroenterology;;   POSTERIOR CERVICAL FUSION/FORAMINOTOMY N/A 06/18/2021   Procedure: POSTERIOR CERVICAL FUSION CERVICAL FIVE THROUGH SIX WITH TRIPLE WIRE TECHNIQUE FIXATION AND RIGHT ILIAC CREST BONE GRAFT HARVEST, LEFT CERVIACL SIX THROUGH SEVEN CERVICAL FORAMINAL LAMINOPLASTY;  Surgeon: Kerrin Champagne, MD;  Location: [facility];  Service: Orthopedics;  Laterality: N/A;   Past Medical History:  Diagnosis Date   Acute incomplete quadriplegia (HCC) 11/02/2021   Anxiety disorder    Asthma    as a child   Autonomic dysreflexia    Cervical myelopathy (HCC) 06/22/2021   Chest pain of uncertain etiology 10/04/2021   Chronic pain syndrome 11/02/2021   Cigarette smoker 10/04/2021   Depression    Diverticulosis    Essential (primary) hypertension    GERD (gastroesophageal reflux disease)    Headache    Neurogenic bowel  02/04/2022   Other spondylosis with radiculopathy, cervical region 06/18/2021   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    Palpitations 10/04/2021   Pneumonia    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/2021   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   Spasticity 11/02/2021   Status post cervical spinal fusion 06/18/2021   BP 126/86   Pulse 93   Ht 5' 9.5" (1.765 m)   Wt 199 lb 6.4 oz (90.4 kg)   SpO2 96%   BMI 29.02 kg/m   Opioid Risk Score:   Fall Risk Score:  `1  Depression screen [facility] 2/9     06/17/2022    9:14 AM 02/04/2022    9:05 AM 11/02/2021    9:39 AM  Depression screen [facility] 2/9  Decreased Interest 3 0 0  Down, Depressed, Hopeless 3 0 3  PHQ - 2 Score 6 0 3  Altered sleeping   3  Tired, decreased energy   0  Change in appetite   0  Feeling bad or failure about yourself    0  Trouble concentrating   0  Moving slowly or fidgety/restless   0  Suicidal thoughts   0  PHQ-9 Score   6     Review of Systems  Musculoskeletal:  Positive for back pain and neck pain.       Shoulder, elbow, hand, fingers, buttocks, hip pain  Neurological:  Positive for weakness, numbness and headaches.  Psychiatric/Behavioral:  Positive for dysphoric mood. The patient is nervous/anxious.   All other systems reviewed and are negative.     Objective:   Physical Exam  Trigger points- esp in scalenes, levators, upper traps, pecs; splenius capitus and rhomboids.  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  He now has chronic nerve and chronic pain since  surgery as well. 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  Is a C2 R hemi/SCI/quadriplegia-  ASIA D. Now depressed with sleep issues as a result and neurogenic bowel/incontinence.    and s/p C5/7 posterior cervical fusion by Dr Otelia Sergeant- 06/18/21.  He now has chronic nerve and chronic pain since  surgery as well. 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  Is a C2 R hemi/SCI/quadriplegia- ASIA D. Now depressed with sleep issues as a result and neurogenic bowel/incontinence.   Refill MS Contin 15 mg BID and Oxycodone 10 mg q6 hours as needed- is due as of 6/22.   2. MRI of thoracic and lumbar spine due to rectal numbness- and new urinary retention- concerning for more compression on Spinal cord. Strength is about the same-   3. Con't Linzess at highest dose- no Movantik   4. Reduce Lyrica to 150 mg BID since causing swelling and to reduce sedation. If swelling doesn't improve, will need to decrease further.    5. Take Long acting morphine and Amitriptyline right at bedtime- so can get pain meds later and will not go >12 hours without taking long acting morphine.   6. C/O Myofascial HA's- and has severe HA's from them as well- suggest theracane 2-4 minutes on trigger points- no massage- just hold pressure.    7. For myofascial pain- Patient here for trigger point injections for  Consent done and on chart.  Cleaned areas with alcohol and injected using a 27 gauge 1.5 inch needle  Injected 3cc Using 1% Lidocaine with no EPI  Upper traps B/L  Levators- B/L  Posterior scalenes Middle scalenes- B/L  Splenius Capitus Pectoralis Major Rhomboids Infraspinatus Teres Major/minor Thoracic paraspinals Lumbar paraspinals Other injections-    Patient's level of pain prior was  4-5/10 Current level of  pain after injections is L side about 3-4/10; R side solid 5-6/10.   There was no bleeding or complications.  Patient was advised to drink a lot of water on day after injections to flush system Will have increased soreness for 12-48 hours after injections.   Can use Lidocaine patches the day AFTER injections Can use theracane on day of injections in places didn't inject Can use heating pad 4-6 hours AFTER injections   8. F/U 3 months- double appointment- will call me to let me know if Trigger point injections work- if so, will work in in 6 weeks.    I spent a total of 52   minutes on total care today- >50% coordination of care- due to 10 minutes on injections; 42 minutes on f/u- as detailed above.

## 2022-06-17 NOTE — Patient Instructions (Signed)
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  He now has chronic nerve and chronic pain since  surgery as well. 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  Is a C2 R hemi/SCI/quadriplegia- ASIA D. Now depressed with sleep issues as a result and neurogenic bowel/incontinence.    and s/p C5/7 posterior cervical fusion by Dr Otelia Sergeant- 06/18/21.  He now has chronic nerve and chronic pain since  surgery as well. 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  Is a C2 R hemi/SCI/quadriplegia- ASIA D. Now depressed with sleep issues as a result and neurogenic bowel/incontinence.   Refill MS Contin 15 mg BID and Oxycodone 10 mg q6 hours as needed- is due as of 6/22.   2. MRI of thoracic and lumbar spine due to rectal numbness- and new urinary retention- concerning for more compression on Spinal cord.   3. Con't Linzess at highest dose- no Movantik   4. Reduce Lyrica to 150 mg BID since causing swelling and to reduce sedation. If swelling doesn't improve, will need to decrease further.    5. Take Long acting morphine and Amitriptyline right at bedtime- so can get pain meds later and will not go >12 hours without taking long acting morphine.   6. C/O Myofascial HA's- and has severe HA's from them as well- suggest theracane 2-4 minutes on trigger points- no massage- just hold pressure.    7. For myofascial pain- Patient here for trigger point injections for  Consent done and on chart.  Cleaned areas with alcohol and injected using a 27 gauge 1.5 inch needle  Injected 3cc Using 1% Lidocaine with no EPI  Upper traps B/L  Levators- B/L  Posterior scalenes Middle scalenes- B/L  Splenius Capitus Pectoralis Major Rhomboids Infraspinatus Teres  Major/minor Thoracic paraspinals Lumbar paraspinals Other injections-    Patient's level of pain prior was  Current level of pain after injections is  There was no bleeding or complications.  Patient was advised to drink a lot of water on day after injections to flush system Will have increased soreness for 12-48 hours after injections.  Can use Lidocaine patches the day AFTER injections Can use theracane on day of injections in places didn't inject Can use heating pad 4-6 hours AFTER injections   8. F/U 3 months- double appointment- will call me to let me know if Trigger point injections work- if so, will work in in 6 weeks.

## 2022-06-18 ENCOUNTER — Telehealth: Payer: Self-pay

## 2022-06-18 ENCOUNTER — Telehealth: Payer: Self-pay | Admitting: *Deleted

## 2022-06-18 NOTE — Telephone Encounter (Signed)
[facility] called and stated [facility] [place] [facility] not [facility] the [facility]one 10 mg. Called pharmacy to cancel it. Patient found it at [facility]

## 2022-06-18 NOTE — Telephone Encounter (Signed)
Prior auth submitted to [facility] for oxycodone 10 mg tablets #150 via CoverMyMeds.

## 2022-06-18 NOTE — Telephone Encounter (Addendum)
Approved 06/18/22 -12/21/22.

## 2022-06-19 ENCOUNTER — Ambulatory Visit: Payer: 59 | Admitting: Cardiology

## 2022-06-19 MED ORDER — OXYCODONE HCL 10 MG PO TABS: 10.0000 mg | ORAL_TABLET | ORAL | 0 refills | Status: DC | PRN

## 2022-06-20 LAB — TOXASSURE SELECT,+ANTIDEPR,UR

## 2022-06-20 IMAGING — CT CT CERVICAL SPINE W/O CM
3 of 4 series · 12 of 33 positions shown, 14 images · non-contrast
Comparison: Plain film cervical spine 02/01/2021.

CLINICAL DATA: Chronic neck pain.  History of C4-6 fusion in 0318.

EXAM:
CT CERVICAL SPINE WITHOUT CONTRAST
TECHNIQUE: Multidetector CT imaging of the cervical spine was performed without
intravenous contrast. Multiplanar CT image reconstructions were also
generated.

[Series 4: sagittal bone · sagittal · 0.28mm/px · 5 of 61 slices shown, 6 images]
[im 21/61  bone]
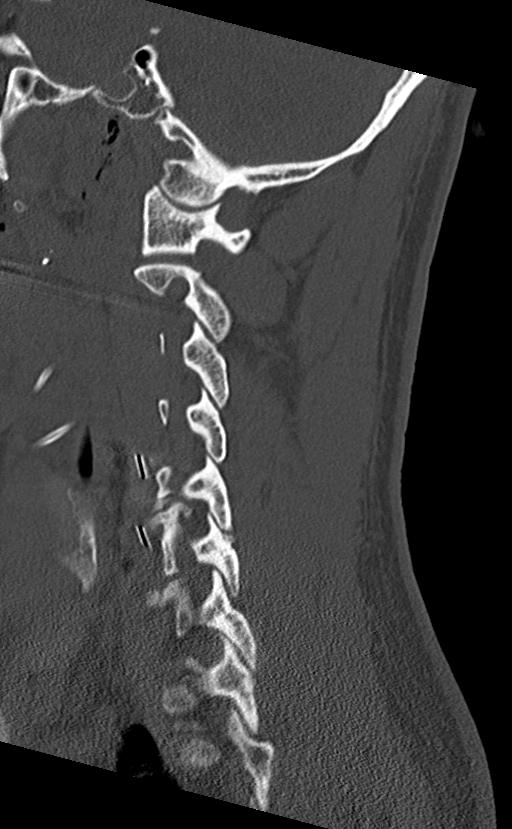
[im 26/61  bone]
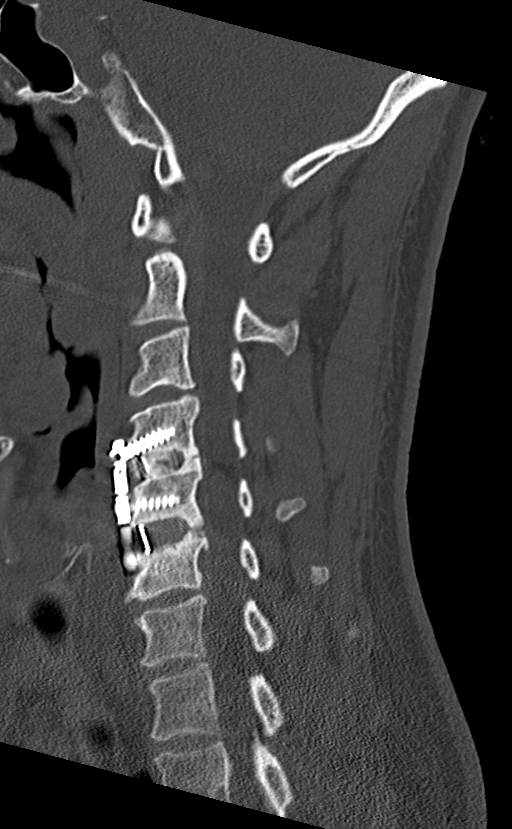
[im 31/61  soft-tissue]
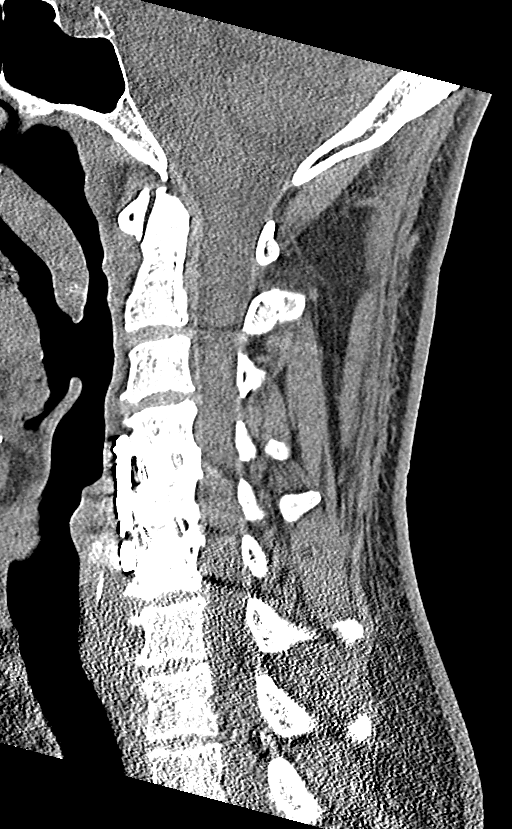
[im 31/61  bone]
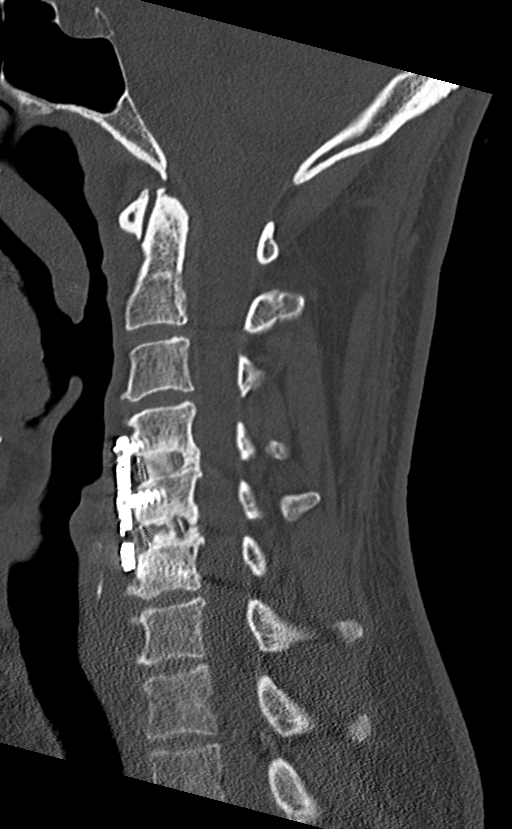
[im 36/61  bone]
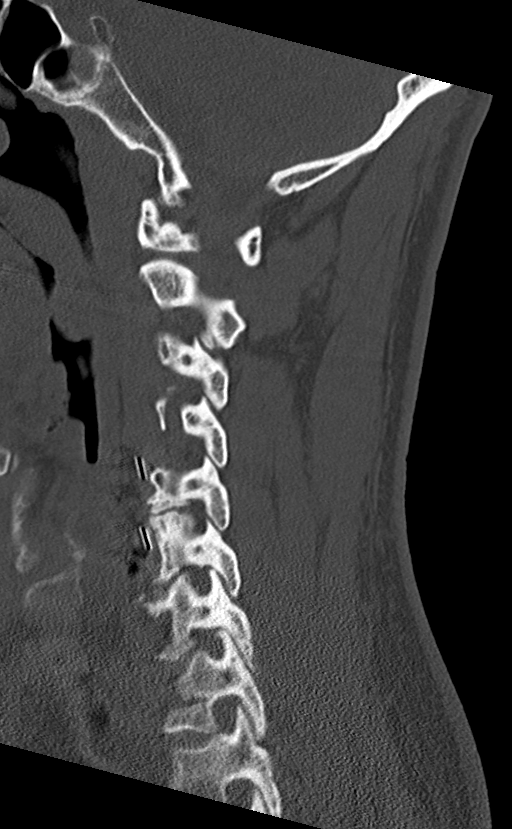
[im 41/61  bone]
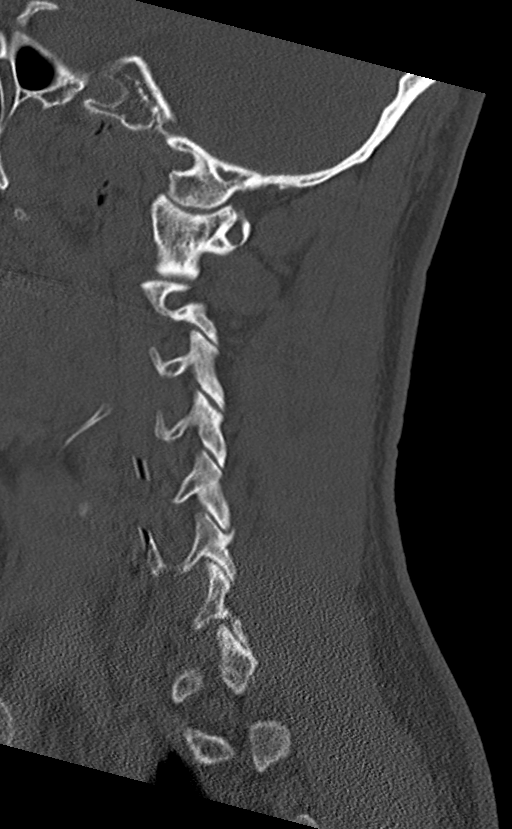

[Series 5: coronal bone · coronal · 0.31mm/px · 3 of 76 slices shown]
[im 16/76  bone]
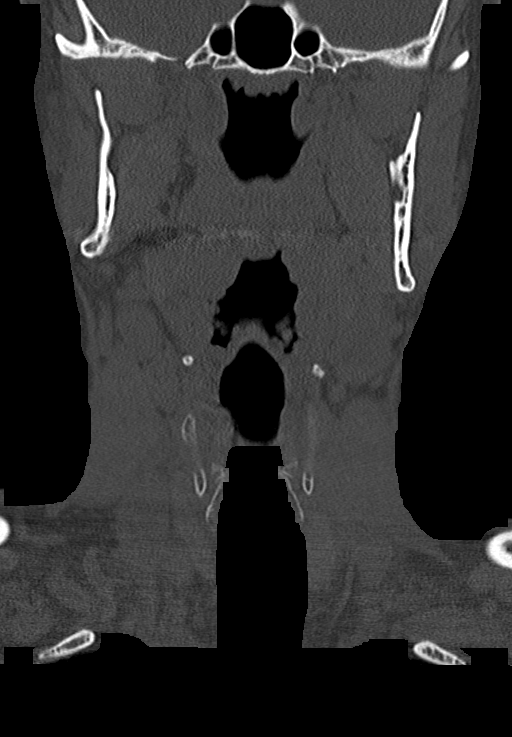
[im 31/76  bone]
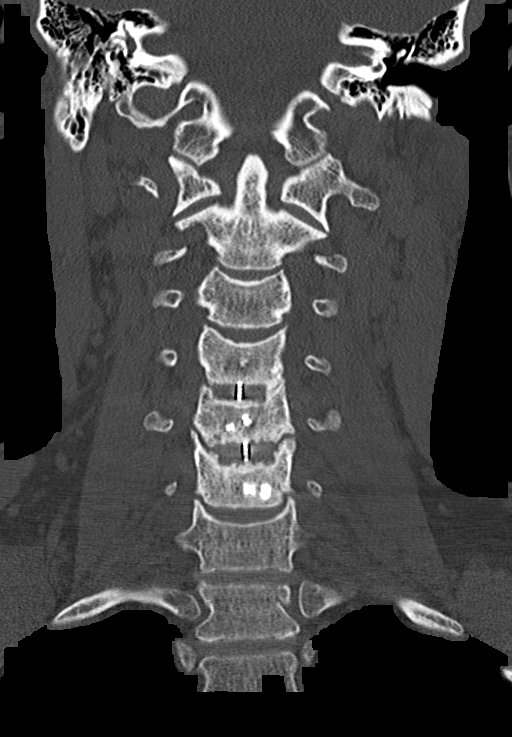
[im 46/76  bone]
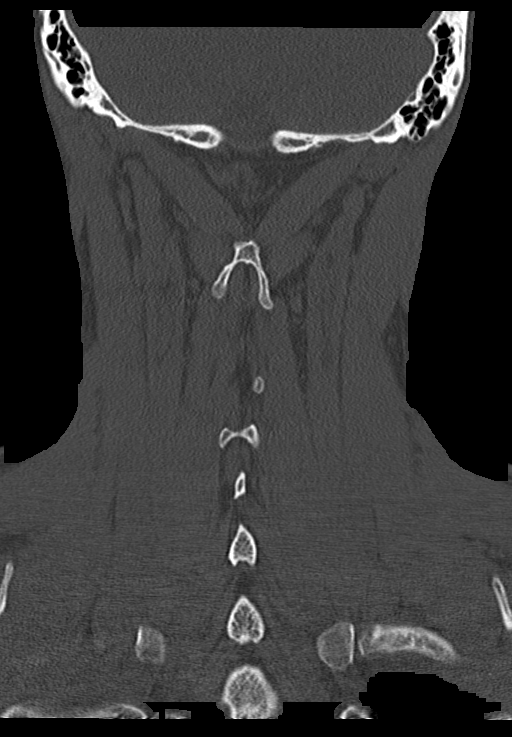

[Series 6: orthogonal bone · axial · 0.28mm/px · z∈[-204,-61]mm · 4 of 112 slices shown, 5 images]
[im 19/112  soft-tissue]
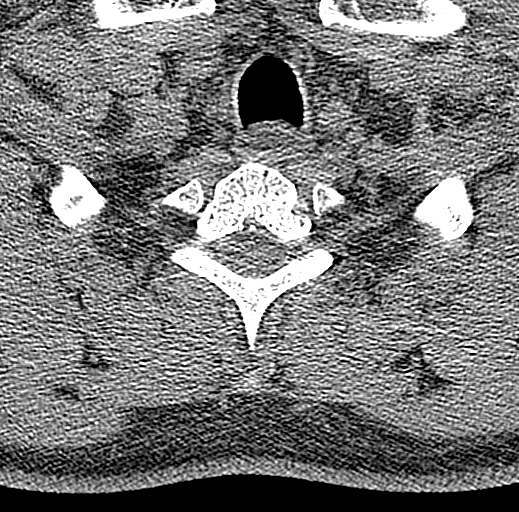
[im 19/112  bone]
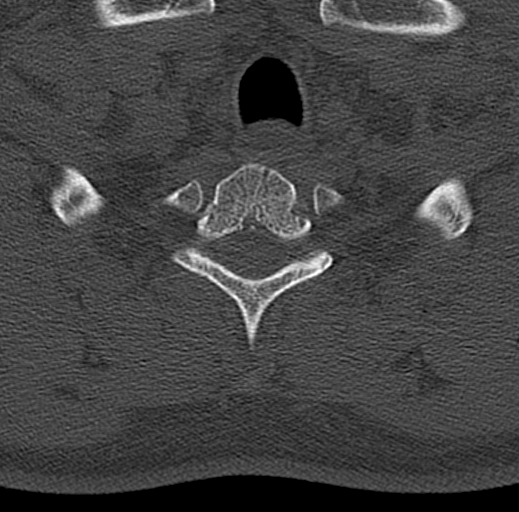
[im 38/112  bone]
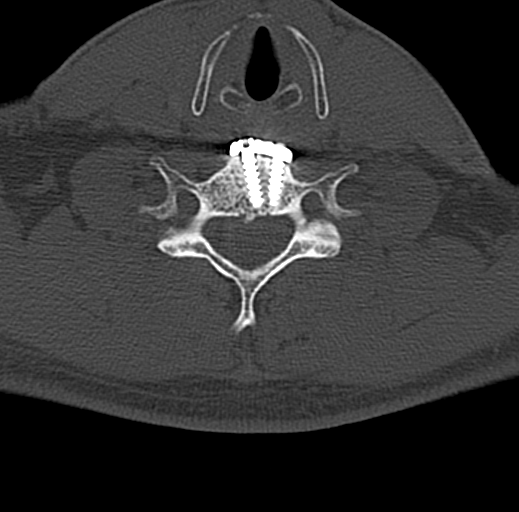
[im 75/112  bone]
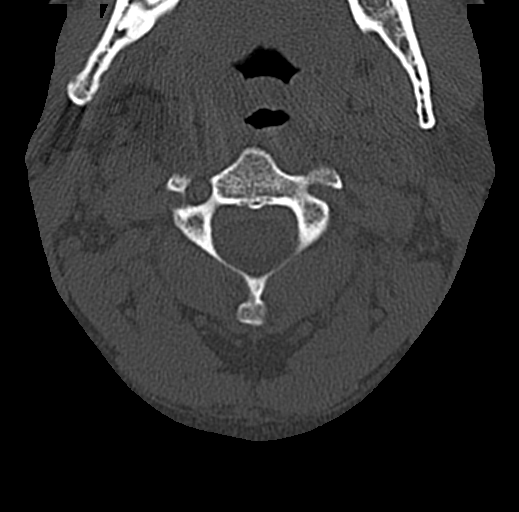
[im 93/112  bone]
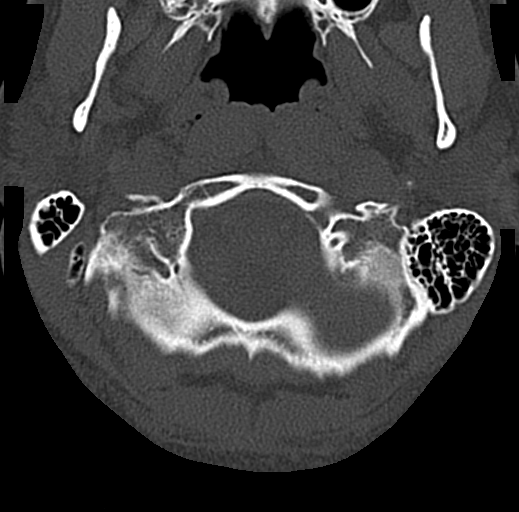

[12 of 33 positions shown; findings below may reference images not displayed]

FINDINGS: Alignment: No listhesis.  Straightening of lordosis noted.

Skull base and vertebrae: No acute fracture. No primary bone lesion
or focal pathologic process. The patient is status post C4-6 ACDF.
Hardware is intact without evidence of loosening. There is solid
bridging bone across the C4-5 disc interspace. There is
incorporation of interbody spacer into the endplates at C5-6 but no
coalescence of bone across the central aspect of the spacer. There
is no facet ankylosis at either level.

Soft tissues and spinal canal: No prevertebral fluid or swelling. No
visible canal hematoma.

Disc levels:  The central canal and foramina appear open.

Upper chest: Emphysematous disease is seen in the apices.

Other: None.
IMPRESSION: Status post C4-6 fusion. There is no bridging bone across the C5-6
disc interspace consistent with pseudoarthrosis. The C4-5 level is
solidly fused. Hardware is intact without loosening or other
complicating feature.

Emphysema ([id number]).

## 2022-06-24 ENCOUNTER — Ambulatory Visit: Payer: 59 | Admitting: Neurology

## 2022-06-24 DIAGNOSIS — R339 Retention of urine, unspecified: Secondary | ICD-10-CM

## 2022-06-24 DIAGNOSIS — G8252 Quadriplegia, C1-C4 incomplete: Secondary | ICD-10-CM

## 2022-06-25 ENCOUNTER — Telehealth: Payer: Self-pay | Admitting: *Deleted

## 2022-06-28 ENCOUNTER — Ambulatory Visit: Payer: 59 | Admitting: Cardiology

## 2022-06-30 ENCOUNTER — Other Ambulatory Visit: Payer: Medicaid Other

## 2022-07-08 ENCOUNTER — Encounter: Payer: Self-pay | Admitting: Internal Medicine

## 2022-07-08 ENCOUNTER — Ambulatory Visit (INDEPENDENT_AMBULATORY_CARE_PROVIDER_SITE_OTHER): Payer: Medicaid Other | Admitting: Internal Medicine

## 2022-07-08 VITALS — BP 126/90 | HR 88 | Ht 69.0 in | Wt 198.0 lb

## 2022-07-08 DIAGNOSIS — K5903 Drug induced constipation: Secondary | ICD-10-CM | POA: Diagnosis not present

## 2022-07-08 DIAGNOSIS — K5900 Constipation, unspecified: Secondary | ICD-10-CM | POA: Diagnosis not present

## 2022-07-08 DIAGNOSIS — T402X5A Adverse effect of other opioids, initial encounter: Secondary | ICD-10-CM

## 2022-07-08 DIAGNOSIS — R1013 Epigastric pain: Secondary | ICD-10-CM

## 2022-07-08 DIAGNOSIS — K219 Gastro-esophageal reflux disease without esophagitis: Secondary | ICD-10-CM | POA: Diagnosis not present

## 2022-07-08 MED ORDER — FAMOTIDINE 20 MG PO TABS: 20.0000 mg | ORAL_TABLET | Freq: Two times a day (BID) | ORAL | 6 refills | Status: DC

## 2022-07-08 MED ORDER — ONDANSETRON HCL 4 MG PO TABS: 4.0000 mg | ORAL_TABLET | Freq: Three times a day (TID) | ORAL | 2 refills | Status: AC | PRN

## 2022-07-08 NOTE — Progress Notes (Signed)
Chief Complaint: Abdominal pain, constipation  HPI : 46 year old male with history of GERD, headache, autonomic dysreflexia presents for follow up of abdominal pain and constipation  Patient states that ever since his cervical neck fusion surgery that was performed in 05/2021, he has continued to have issues with abdominal distention.  He has struggled with constipation issues.  If he does not have a bowel movement, this can cause severe dysautonomia reactions in the patient.  This usually manifests as fluctuations in his blood pressure.  Patient is currently on Linzess to 290 mcg daily, Dulcolax, MiraLAX along with suppositories and enemas as needed.  Unfortunately when he takes laxatives, he will sometimes have fecal incontinence due to lack of sensation of stool coming out.  Denies blood in stools.  Endorses epigastric abdominal pain that gets better after he has a bowel movement.  Eating seems to make his pain worse.  Denies prior colonoscopy or EGD.  He has gained about 55 pounds since 07/2021.  He was told recently that he has an umbilical hernia.  He does take oxycodone 5 to 6 tablets/day along with morphine ER 15 mg twice daily.  Denies dysphagia.  Has had issues with GERD ever since his surgery.  Endorses nausea. Denies vomiting. Her children have EoE along with food allergies. Denies other fam hx of GI cancers.  He has difficulty with extended physical activity, and has been trying to be better about drinking more water  Interval History: He was having a good flow with his BMs. Then he started Movantik and this seemed to constipate him more. He has had a couple of bad days over the last few days. His BMs have been more infrequent.  He is using suppositories and enemas to help induce BMs when he gets more constipated. He is currently taking TID Miralax with additional doses as needed. When he was taking too much Miralax, this can cause diarrhea and fecal incontinence. Currently aiming for 3 BMs per  day seems to make him feel good. When he sits on the toilet for too long, this can cause an episode of autonomic dysfunction. He is taking omeprazole 40 mg BID and is waking up with acid reflux, manifesting as regurgitation. He did go to pelvic floor PT who recommended a squatty potty to optimize his positioning when he is passing a stool. Patient is making good efforts with increasing his hydration.  Current Outpatient Medications  Medication Sig Dispense Refill   amitriptyline (ELAVIL) 75 MG tablet Take 1 tablet (75 mg total) by mouth at bedtime. 30 tablet 3   bisacodyl (DULCOLAX) 5 MG EC tablet Take 1 tablet (5 mg total) by mouth daily as needed for moderate constipation. 30 tablet 0   LINZESS 290 MCG CAPS capsule Take 290 mcg by mouth daily.     metoprolol tartrate (LOPRESSOR) 25 MG tablet TAKE 0.5 TABLETS (12.5 MG TOTAL) BY MOUTH 2 (TWO) TIMES DAILY AS NEEDED (FOR FAST HEART RATE-). 30 tablet 1   morphine (MS CONTIN) 15 MG 12 hr tablet Take 1 tablet (15 mg total) by mouth every 12 (twelve) hours. 60 tablet 0   nitroGLYCERIN (NITROSTAT) 0.4 MG SL tablet Place 1 tablet (0.4 mg total) under the tongue every 15 (fifteen) minutes as needed (for autonomic dysreflexia- max 3 tab/day). 12 tablet 11   Oxycodone HCl 10 MG TABS Take 1 tablet (10 mg total) by mouth every 4 (four) hours as needed (max 5 tabs/day). 150 tablet 0   polyethylene glycol (MIRALAX)  17 g packet Take 17 g by mouth daily. 28 each 5   pregabalin (LYRICA) 150 MG capsule Take 1 capsule (150 mg total) by mouth 4 (four) times daily. 120 capsule 5   tiZANidine (ZANAFLEX) 4 MG tablet Take 2 tablets (8 mg total) by mouth every 6 (six) hours. (Patient taking differently: Take 4 mg by mouth in the morning, at noon, in the evening, and at bedtime.) 720 tablet 1   FLUoxetine (PROZAC) 20 MG capsule TAKE 1 CAPSULE BY MOUTH EVERY DAY (Patient not taking: Reported on 04/04/2022) 90 capsule 2   naloxegol oxalate (MOVANTIK) 25 MG TABS tablet Take 1 tablet  (25 mg total) by mouth daily. (Patient not taking: Reported on 07/08/2022) 30 tablet 3   omeprazole (PRILOSEC) 40 MG capsule Take 1 capsule (40 mg total) by mouth 2 (two) times daily. Take for 8 weeks twice daily, then drop down to daily (Patient not taking: Reported on 07/08/2022) 90 capsule 3   No current facility-administered medications for this visit.   Review of Systems: All systems reviewed and negative except where noted in HPI.   Physical Exam: Today's Vitals   07/08/22 1539  BP: 126/90  Pulse: 88  Weight: 198 lb (89.8 kg)  Height: 5\' 9"  (1.753 m)   Body mass index is 29.24 kg/m. Constitutional: Pleasant,well-developed, male in no acute distress. HEENT: Normocephalic and atraumatic. Conjunctivae are normal. No scleral icterus. Cardiovascular: Normal rate, regular rhythm.  Pulmonary/chest: Effort normal and breath sounds normal. No wheezing, rales or rhonchi. Abdominal: Soft, nondistended, non-tender. Umbilical hernia present that is soft and reducible. Bowel sounds active throughout. There are no masses palpable. No hepatomegaly. Extremities: Trace BLE edema Neurological: Alert and oriented to person place and time. Skin: Skin is warm and dry. No rashes noted. Psychiatric: Normal mood and affect. Behavior is normal.  Labs 02/2022: CBC with elevated WBC of 21 (persistent over the last several months). CMP unremarkable. Lipase normal.   CTA head and neck 03/25/22: IMPRESSION: 1.  No acute intracranial process. 2.  No hemodynamically significant stenosis in the neck. 3. No intracranial large vessel occlusion. Mild stenosis in the left V4 segment. No other significant intracranial stenosis. 4. For findings in the chest, please see same-day CT chest abdomen pelvis.  CTA C/A/P 03/25/22: IMPRESSION: 1. No acute intrathoracic, abdominal, or pelvic pathology. No aortic dissection or aneurysm. 2. No pulmonary artery embolus identified. 3. Paraseptal emphysema. Diffuse hazy  density throughout the lungs may represent atelectasis, edema, or related to chronic lung disease or pneumonitis. 4. Faint ground-glass nodules versus confluence of interstitial markings/edema. These measure up to 9 mm in the left upper lobe. Attention on follow-up imaging recommended. 5. Aortic Atherosclerosis (ICD10-I70.0) and Emphysema (ICD10-J43.9).  EGD 04/08/22: - White nummular lesions in esophageal mucosa. Biopsied. - Erosive gastropathy with stigmata of recent bleeding. Biopsied. - Normal examined duodenum. Biopsied. Path: A. DUODENUM, BIOPSY:  - Duodenal mucosa with normal villous architecture.  - No villous atrophy or increased intraepithelial lymphocytes.  B. STOMACH, BIOPSY:  - Unremarkable antral and oxyntic mucosa.  - No Helicobacter pylori identified.  C. ESOPHAGUS, BIOPSY:  - Squamous mucosa with hyperemia.  - No eosinophilic esophagitis.  - PAS stain negative for fungus.   Colonoscopy 04/08/22: - The examined portion of the ileum was normal. - One 3 mm polyp in the ascending colon, removed with a cold snare. Resected and retrieved. - Non-bleeding internal hemorrhoids. Path: D. COLON, ASCENDING POLYP:  - Benign colonic mucosa.  - Multiple additional levels examined  ASSESSMENT AND PLAN: Constipation Epigastric abdominal pain Opioid dependence GERD Erosive gastritis Patient presents for follow up of constipation that has been on ever since his neck surgery in 2022.  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 At this time we are working on trying to find a good regimen to allow for regular BMs. He is using a combination of increase hydration, Miralax, enemas/suppositories, and digital stimulation. Patient did not tolerate Movantik well. Will have the patient try to eat 2 kiwis per day and do another trial of Movantik to see if this  helps - Prescribe Zofran 4 mg TID PRN - Continue to increase physical activity if possible - Continue Miralax, Linzess, and suppositories/enemas - Start 2 kiwis per day - Continue omeprazole 40 mg BID, take 30 minutes before eating - Start famotidine 20 mg BID - Can trial Movantik again - Patient previously tried pelvic floor PT - RTC in 2-3 months  Eulah Pont, MD  I spent 40 minutes of time, including in depth chart review, independent review of results as outlined above, communicating results with the patient directly, face-to-face time with the patient, coordinating care, ordering studies and medications as appropriate, and documentation.

## 2022-07-08 NOTE — Patient Instructions (Signed)
If you are age 44 or older, your body mass index should be between 23-30. Your Body mass index is 29.24 kg/m. If this is out of the aforementioned range listed, please consider follow up with your Primary Care Provider.  If you are age 15 or younger, your body mass index should be between 19-25. Your Body mass index is 29.24 kg/m. If this is out of the aformentioned range listed, please consider follow up with your Primary Care Provider.   ________________________________________________________  The [place] [facility] providers would like to encourage you to use [facility] to communicate with providers for non-urgent requests or questions.  Due to long hold times on the telephone, sending your provider a message by [facility] may be a faster and more efficient way to get a response.  Please allow 48 business hours for a response.  Please remember that this is for non-urgent requests.  _______________________________________________________  We have sent the following medications to your pharmacy for you to pick up at your convenience:  Zofran, Pepcid  Try Movantiq again.  Eat 2 kiwis per day  Please follow up in 3 months

## 2022-07-11 ENCOUNTER — Ambulatory Visit (HOSPITAL_COMMUNITY)
Admission: RE | Admit: 2022-07-11 | Discharge: 2022-07-11 | Disposition: A | Discharge status: Home / Self Care | Payer: Medicaid Other | Source: Ambulatory Visit | Attending: Physical Medicine and Rehabilitation | Admitting: Physical Medicine and Rehabilitation

## 2022-07-11 ENCOUNTER — Ambulatory Visit (HOSPITAL_COMMUNITY): Admission: RE | Admit: 2022-07-11 | Payer: Medicaid Other | Source: Ambulatory Visit

## 2022-07-11 DIAGNOSIS — G8252 Quadriplegia, C1-C4 incomplete: Secondary | ICD-10-CM | POA: Diagnosis present

## 2022-07-11 DIAGNOSIS — K592 Neurogenic bowel, not elsewhere classified: Secondary | ICD-10-CM | POA: Diagnosis present

## 2022-07-11 DIAGNOSIS — G8250 Quadriplegia, unspecified: Secondary | ICD-10-CM | POA: Insufficient documentation

## 2022-07-11 DIAGNOSIS — N319 Neuromuscular dysfunction of bladder, unspecified: Secondary | ICD-10-CM | POA: Diagnosis present

## 2022-07-11 DIAGNOSIS — R339 Retention of urine, unspecified: Secondary | ICD-10-CM | POA: Insufficient documentation

## 2022-07-12 ENCOUNTER — Encounter: Payer: Self-pay | Admitting: *Deleted

## 2022-07-12 ENCOUNTER — Encounter: Payer: Self-pay | Admitting: Cardiology

## 2022-07-12 ENCOUNTER — Ambulatory Visit (INDEPENDENT_AMBULATORY_CARE_PROVIDER_SITE_OTHER): Payer: Medicaid Other | Admitting: Cardiology

## 2022-07-12 VITALS — BP 119/79 | HR 100 | Ht 69.6 in | Wt 197.6 lb

## 2022-07-12 DIAGNOSIS — M4722 Other spondylosis with radiculopathy, cervical region: Secondary | ICD-10-CM | POA: Diagnosis not present

## 2022-07-12 DIAGNOSIS — R002 Palpitations: Secondary | ICD-10-CM | POA: Diagnosis not present

## 2022-07-12 NOTE — Progress Notes (Signed)
Cardiology Office Note:    Date:  07/12/2022   ID:  Victor Huang, DOB May 05, 1976, MRN [id number]  PCP:  Dema Severin, NP  Cardiologist:  Garwin Brothers, MD   Referring MD: Dema Severin, NP    ASSESSMENT:    1. Other spondylosis with radiculopathy, cervical region   2. Palpitations    PLAN:    In order of problems listed above:  Prevention stressed with the patient.  Importance of compliance with diet medication stressed and vocalized understanding. Palpitations: These have resolved and the patient is happy about it.  He tells me that his beta-blocker was changed to short-acting tartrate and he likes it much better when he has palpitations. History of smoking: He was counseled about risks of smoking and he understands Patient will be seen in follow-up appointment in 9 months or earlier if the patient has any concerns    Medication Adjustments/Labs and Tests Ordered: Current medicines are reviewed at length with the patient today.  Concerns regarding medicines are outlined above.  No orders of the defined types were placed in this encounter.  No orders of the defined types were placed in this encounter.    No chief complaint on file.    History of Present Illness:    Victor Huang is a 46 y.o. male.  Patient has past medical history of palpitations for which reason he was evaluated.  Also had chest pain.  Denies any problems at this time and takes care of activities of daily living.  He still has issues with his spondylosis.  At the time of my evaluation, the patient is alert awake oriented and in no distress.  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    Palpitations 10/04/2021   Pneumonia    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/2021   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   Spasticity 11/02/2021   Status post cervical spinal fusion 06/18/2021    Past Surgical History:  Procedure Laterality Date   BIOPSY  04/08/2022   Procedure: BIOPSY;  Surgeon: Imogene Burn, MD;  Location: [facility];  Service: Gastroenterology;;   COLONOSCOPY WITH PROPOFOL N/A 04/08/2022   Procedure: COLONOSCOPY WITH PROPOFOL;  Surgeon: Imogene Burn, MD;  Location: [facility];  Service: Gastroenterology;  Laterality: N/A;   ESOPHAGOGASTRODUODENOSCOPY (EGD) WITH PROPOFOL N/A 04/08/2022   Procedure: ESOPHAGOGASTRODUODENOSCOPY (EGD) WITH PROPOFOL;  Surgeon: Imogene Burn, MD;  Location: [facility];  Service: Gastroenterology;  Laterality: N/A;   POLYPECTOMY  04/08/2022   Procedure: POLYPECTOMY;  Surgeon: Imogene Burn, MD;  Location: [facility];  Service: Gastroenterology;;   POSTERIOR CERVICAL FUSION/FORAMINOTOMY N/A 06/18/2021   Procedure: POSTERIOR CERVICAL FUSION CERVICAL FIVE THROUGH SIX WITH TRIPLE WIRE TECHNIQUE FIXATION AND RIGHT ILIAC CREST BONE GRAFT HARVEST, LEFT CERVIACL SIX THROUGH SEVEN CERVICAL FORAMINAL LAMINOPLASTY;  Surgeon: Kerrin Champagne, MD;  Location: [facility] OR;  Service: Orthopedics;  Laterality: N/A;    Current Medications: Current Meds  Medication Sig   amitriptyline (ELAVIL) 75 MG tablet Take 1 tablet (75 mg total) by mouth at bedtime.   bisacodyl (DULCOLAX) 5 MG EC tablet Take 1 tablet (5 mg total) by mouth daily as needed for moderate constipation.   famotidine (PEPCID) 20 MG tablet Take 1 tablet (20 mg total) by mouth 2 (two) times daily.   LINZESS 290 MCG CAPS capsule Take 290 mcg by mouth daily.   metoprolol tartrate (LOPRESSOR)  25 MG tablet TAKE 0.5 TABLETS (12.5 MG TOTAL) BY MOUTH 2 (TWO) TIMES DAILY AS NEEDED (FOR FAST HEART RATE-).   morphine (MS CONTIN) 15 MG 12 hr tablet Take 1 tablet (15 mg total) by mouth every 12 (twelve) hours.   nitroGLYCERIN (NITROSTAT) 0.4 MG SL tablet Place 1 tablet (0.4 mg total) under the tongue every 15 (fifteen) minutes as needed (for autonomic dysreflexia- max 3 tab/day).   omeprazole (PRILOSEC) 40 MG capsule Take 1 capsule (40 mg total) by mouth 2 (two) times daily. Take for 8 weeks twice daily, then drop down to daily   ondansetron (ZOFRAN) 4 MG tablet Take 1 tablet (4 mg total) by mouth 3 (three) times daily as needed for nausea or vomiting.   Oxycodone HCl 10 MG TABS Take 1 tablet (10 mg total) by mouth every 4 (four) hours as needed (max 5 tabs/day).   polyethylene glycol (MIRALAX) 17 g packet Take 17 g by mouth daily.   pregabalin (LYRICA) 150 MG capsule Take 1 capsule (150 mg total) by mouth 4 (four) times daily.   tiZANidine (ZANAFLEX) 4 MG tablet Take 2 tablets (8 mg total) by mouth every 6 (six) hours.     Allergies:   Ibuprofen   Social History   Socioeconomic History   Marital status: Married    Spouse name: Not on file   Number of children: 2   Years of education: Not on file   Highest education level: Not on file  Occupational History   Not on file  Tobacco Use   Smoking status: Every Day    Packs/day: 1.00    Types: Cigarettes    Start date: 01/01/1987   Smokeless tobacco: Never  Vaping Use   Vaping Use: Never used  Substance and Sexual Activity   Alcohol use: Never    Comment: no beer in over 15 years   Drug use: Not Currently   Sexual activity: Yes  Other Topics Concern   Not on file  Social History Narrative   Not on file   Social Determinants of Health   Financial Resource Strain: Not on file  Food Insecurity: Not on file  Transportation Needs: Not on file  Physical Activity: Not on file  Stress: Not on file  Social Connections: Not on file      Family History: The patient's family history includes Allergies in his son and son; Asthma in his maternal grandmother, son, and son; Cervical cancer in his mother; Colitis in his son and son; Diabetes in his mother; Food Allergy in his son and son; GER disease in his son and son; Heart attack in his father; Hypertension in his mother; Irritable bowel syndrome in his son; Other in his son; Seizures in his son. There is no history of Colon cancer, Esophageal cancer, Stomach cancer, Pancreatic cancer, or Colon polyps.  ROS:   Please see the history of present illness.    All other systems reviewed and are negative.  EKGs/Labs/Other Studies Reviewed:    The following studies were reviewed today:  I discussed my findings with the patient at length   Recent Labs: 10/04/2021: TSH 1.570 03/24/2022: ALT 29; BUN 16; Creatinine, Ser 0.60; Hemoglobin 14.3; Platelets 372; Potassium 4.6; Sodium 139  Recent Lipid Panel No results found for: "CHOL", "TRIG", "HDL", "CHOLHDL", "VLDL", "LDLCALC", "LDLDIRECT"  Physical Exam:    VS:  BP 119/79   Pulse 100   Ht 5' 9.6" (1.768 m)   Wt 197 lb 9.6 oz (89.6 kg)   SpO2 97%   BMI 28.68 kg/m     Wt Readings from Last 3 Encounters:  07/12/22 197 lb 9.6 oz (89.6 kg)  07/08/22 198 lb (89.8 kg)  06/17/22 199 lb 6.4 oz (90.4 kg)     GEN: Patient is in no acute distress HEENT: Normal NECK: No JVD; No carotid bruits LYMPHATICS: No lymphadenopathy CARDIAC: Hear sounds regular, 2/6 systolic murmur at the apex. RESPIRATORY:  Clear to auscultation without rales, wheezing or rhonchi  ABDOMEN: Soft, non-tender, non-distended MUSCULOSKELETAL:  No edema; No deformity  SKIN: Warm and dry NEUROLOGIC:  Alert and oriented x 3 PSYCHIATRIC:  Normal affect   Signed, Garwin Brothers, MD  07/12/2022 1:16 PM    [facility]

## 2022-07-12 NOTE — Progress Notes (Signed)
Can we let pt know his MRI's look good- I filled out paperwork for him today and they should be calling him that it's done- thanks- ML

## 2022-07-12 NOTE — Patient Instructions (Signed)

## 2022-07-13 ENCOUNTER — Other Ambulatory Visit: Payer: Self-pay | Admitting: Physical Medicine and Rehabilitation

## 2022-07-15 ENCOUNTER — Other Ambulatory Visit: Payer: Self-pay | Admitting: Specialist

## 2022-07-15 MED ORDER — OXYCODONE HCL 10 MG PO TABS: 10.0000 mg | ORAL_TABLET | ORAL | 0 refills | Status: DC | PRN

## 2022-07-15 MED ORDER — MORPHINE SULFATE ER 15 MG PO TBCR: 15.0000 mg | EXTENDED_RELEASE_TABLET | Freq: Two times a day (BID) | ORAL | 0 refills | Status: DC

## 2022-07-15 NOTE — Telephone Encounter (Signed)
Dr Berline Chough note was reviewed.  PMP was Reviewed,  His next scheduled appointment is in October MS Contin and Oxycodone e-scribed to pharmacy.

## 2022-08-11 ENCOUNTER — Encounter: Payer: Self-pay | Admitting: Physical Medicine and Rehabilitation

## 2022-08-13 ENCOUNTER — Telehealth: Payer: Self-pay

## 2022-08-13 MED ORDER — OXYCODONE HCL 10 MG PO TABS: 10.0000 mg | ORAL_TABLET | ORAL | 0 refills | Status: DC | PRN

## 2022-08-13 MED ORDER — MORPHINE SULFATE ER 15 MG PO TBCR: 15.0000 mg | EXTENDED_RELEASE_TABLET | Freq: Two times a day (BID) | ORAL | 0 refills | Status: DC

## 2022-08-13 NOTE — Telephone Encounter (Signed)
PMP was Reviewed.  Dr Berline Chough note was reviewed.  MS Contin and Oxycodone e-scribed today.

## 2022-08-13 NOTE — Telephone Encounter (Addendum)
Dr. Berline Chough is out off this week. Will you please send in Victor Huang pain medication? He said the pharmacy has it in stock.   Filled  Written  ID  Drug  QTY  Days  Prescriber  RX #  Dispenser  Refill  Daily Dose*  Pymt Type  PMP  07/18/2022 07/15/2022 1  Morphine Sulf Er 15 Mg Tablet 60.00 30 Eu Tho [id number] Nor ([id number]) 0/0 30.00 MME Medicaid [place] 07/17/2022 07/15/2022 1  Oxycodone Hcl (Ir) 10 Mg Tab 150.00 30 Eu Tho [id number] Nor ([id number]) 0/0 75.00 MME Medicaid [place]

## 2022-08-14 ENCOUNTER — Encounter: Payer: Self-pay | Admitting: *Deleted

## 2022-08-14 ENCOUNTER — Telehealth: Payer: Self-pay | Admitting: *Deleted

## 2022-08-14 NOTE — Telephone Encounter (Signed)
Prior auth submitted to [facility] via CoverMyMeds for Morphine Sulfate ER 15 mg.

## 2022-08-14 NOTE — Telephone Encounter (Signed)
Approved 08/14/22- 11/12/22. Pharmacy notified.

## 2022-08-19 MED ORDER — LINZESS 290 MCG PO CAPS
290.0000 ug | ORAL_CAPSULE | Freq: Every day | ORAL | 1 refills | Status: DC
Start: 2022-08-19 — End: 2023-01-20

## 2022-09-10 ENCOUNTER — Other Ambulatory Visit: Payer: Self-pay | Admitting: Physical Medicine and Rehabilitation

## 2022-09-10 MED ORDER — MORPHINE SULFATE ER 15 MG PO TBCR: 15.0000 mg | EXTENDED_RELEASE_TABLET | Freq: Two times a day (BID) | ORAL | 0 refills | Status: DC

## 2022-09-10 MED ORDER — OXYCODONE HCL 10 MG PO TABS: 10.0000 mg | ORAL_TABLET | ORAL | 0 refills | Status: DC | PRN

## 2022-09-13 ENCOUNTER — Telehealth: Payer: Self-pay | Admitting: Specialist

## 2022-09-13 NOTE — Telephone Encounter (Signed)
I have request from [facility] [employer]. Please copy all images from 12/30/18-09/06/22 to CD. Let me know when ready. Thank you!!

## 2022-09-16 NOTE — Telephone Encounter (Signed)
CD is ready! 

## 2022-09-23 ENCOUNTER — Other Ambulatory Visit: Payer: Self-pay | Admitting: Neurology

## 2022-09-23 ENCOUNTER — Other Ambulatory Visit: Payer: Self-pay | Admitting: Physical Medicine and Rehabilitation

## 2022-10-02 ENCOUNTER — Encounter: Payer: Self-pay | Admitting: Physical Medicine and Rehabilitation

## 2022-10-05 ENCOUNTER — Other Ambulatory Visit: Payer: Self-pay | Admitting: Physical Medicine and Rehabilitation

## 2022-10-07 ENCOUNTER — Ambulatory Visit: Payer: Medicaid Other | Admitting: Physical Medicine and Rehabilitation

## 2022-10-07 MED ORDER — MORPHINE SULFATE ER 15 MG PO TBCR: 15.0000 mg | EXTENDED_RELEASE_TABLET | Freq: Two times a day (BID) | ORAL | 0 refills | Status: DC

## 2022-10-07 MED ORDER — OXYCODONE HCL 10 MG PO TABS: 10.0000 mg | ORAL_TABLET | ORAL | 0 refills | Status: DC | PRN

## 2022-10-07 NOTE — Telephone Encounter (Signed)
PMP was Reviewed.  Dr Dagoberto Ligas note was reviewed.  Morphine and Oxycodone e-scribed to pharmacy

## 2022-10-17 ENCOUNTER — Telehealth: Payer: Self-pay | Admitting: Neurology

## 2022-10-17 ENCOUNTER — Encounter: Payer: Self-pay | Admitting: Neurology

## 2022-10-17 ENCOUNTER — Telehealth (INDEPENDENT_AMBULATORY_CARE_PROVIDER_SITE_OTHER): Payer: Medicaid Other | Admitting: Neurology

## 2022-10-17 DIAGNOSIS — G894 Chronic pain syndrome: Secondary | ICD-10-CM

## 2022-10-17 DIAGNOSIS — G959 Disease of spinal cord, unspecified: Secondary | ICD-10-CM | POA: Diagnosis not present

## 2022-10-17 DIAGNOSIS — G8250 Quadriplegia, unspecified: Secondary | ICD-10-CM | POA: Diagnosis not present

## 2022-10-17 DIAGNOSIS — G904 Autonomic dysreflexia: Secondary | ICD-10-CM

## 2022-10-17 MED ORDER — OXCARBAZEPINE 300 MG PO TABS: ORAL_TABLET | ORAL | 11 refills | Status: DC

## 2022-10-17 NOTE — Progress Notes (Signed)
Reason for visit: Cervical myelopathy  Victor Huang is an 46 y.o. male with a spinal cord injury  Virtual Visit via Video Note I connected with Victor Huang  on 10/17/22 at  1:00 PM EDT by a video enabled telemedicine application and verified that I am speaking with the correct person.  I discussed the limitations of evaluation and management by telemedicine and the availability of in person appointments. The patient expressed understanding and agreed to proceed.  Patient was in his home.  Provider was in the office.   History of present illness: UPDATE 10/17/2022 He feels he has been stable - no better or worse.  He notes no new weakness or numbness.  He has constant right upper arm numbness and intermittent worsening right arm numbness, right leg numbness and sometimes left sided.   The intermittent numbness will be neck down.   He has mild right arm weakness.  Using the right arm sets off the autonomic dysreflexia.  Pain also triggers the dysreflexia.  At the last visit, I started lamotrigine but he actually felt the episodes worsened  He sees pain management.   He is on morphine, oxycodone pregabalin, tizanidine and amitriptyline to help with pain issues related with his spinal cord hemorrhage.  Higher dose of pregabalin caused BP drop and he needed to drop the dose back.      Strength is fairly normal.  Gait is fine though balance is sometimes a little off.  Bladder function is a little off and he strains at times to empty..  He has had constipation.  If this occurs, his neurologic symptoms worsen.  He takes Linzess, Miralax, colace and occasionally uses a suppository.   Due to these symptoms, he only eats small portions.   If he over-eats he gets BP swings.      Limited physical examination showed the head was normocephalic and atraumatic.  Extraocular muscles were intact.  Facial strength was symmetric.  He was able to move both arms and hands.  Images reviewed today MRI of the  cervical spine 06/22/2021 shows an acute hemorrhagic focus within the spinal cord towards the right adjacent to C5.Marland Kitchen  C4-C6 ACDF.  MRI cervical 07/18/21 shows a small focus of chronic hemorrhage in the right posterolateral spinal cord adjacent to C5.  History of C5 hemorrhage and from Dr. Anne Huang: Victor Huang is a right-handed Hispanic male with a history of a cervical myelopathy.  The patient underwent cervical spine surgery on 18 June 2021 and unfortunately had a complication with severe edema and hemorrhage into the spinal cord following the surgery.  The hemorrhage occurred around the C5 level.  A repeat MRI in July 2022 showed significant improvement in the spinal cord edema, with residual lesion in the lateral cord at the C5 level.  The patient has been left with severe neck stiffness and decreased mobility.  He has numbness of the right hand and neuropathic discomfort in the upper arm on the right with paresthesias and lancinating pains.  He occasionally may have some troubles on the left side as well but the right arm is the primary issue.  If he flexes his head, he may have some sensory alteration down into the legs.  The patient is also having episodes where he will have a buildup of pressure in the neck with onset of increased heart rate into the 135 range.  The patient may have flushing of the face and increased discomfort down the right arm with these  events.  The events may be brought on by prolonged sitting or standing, he feels better when he is lying down.  Eating a large meal may also bring on an episode.  The patient has been seen through cardiology, he will be set up for a chemical stress test.  The patient does get some benefit from the tizanidine.  He is on amitriptyline chronically for sleep.  Past Medical History:  Diagnosis Date   Acute incomplete quadriplegia (HCC) 11/02/2021   Anxiety disorder    Asthma    as a child   Autonomic dysreflexia    Cervical myelopathy (HCC)  06/22/2021   Chest pain of uncertain etiology 10/04/2021   Chronic pain syndrome 11/02/2021   Cigarette smoker 10/04/2021   Depression    Diverticulosis    Essential (primary) hypertension    GERD (gastroesophageal reflux disease)    Headache    Neurogenic bowel 02/04/2022   Other spondylosis with radiculopathy, cervical region 06/18/2021   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    Palpitations 10/04/2021   Pneumonia    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/2021   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   Spasticity 11/02/2021   Status post cervical spinal fusion 06/18/2021    Past Surgical History:  Procedure Laterality Date   BIOPSY  04/08/2022   Procedure: BIOPSY;  Surgeon: Imogene Burn, MD;  Location: [facility];  Service: Gastroenterology;;   COLONOSCOPY WITH PROPOFOL N/A 04/08/2022   Procedure: COLONOSCOPY WITH PROPOFOL;  Surgeon: Imogene Burn, MD;  Location: [facility];  Service: Gastroenterology;  Laterality: N/A;   ESOPHAGOGASTRODUODENOSCOPY (EGD) WITH PROPOFOL N/A 04/08/2022   Procedure: ESOPHAGOGASTRODUODENOSCOPY (EGD) WITH PROPOFOL;  Surgeon: Imogene Burn, MD;  Location: [facility];  Service: Gastroenterology;  Laterality: N/A;   POLYPECTOMY  04/08/2022   Procedure: POLYPECTOMY;  Surgeon: Imogene Burn, MD;  Location: [facility];  Service: Gastroenterology;;   POSTERIOR CERVICAL FUSION/FORAMINOTOMY N/A 06/18/2021   Procedure: POSTERIOR CERVICAL FUSION CERVICAL FIVE THROUGH SIX WITH TRIPLE WIRE TECHNIQUE FIXATION AND RIGHT ILIAC CREST BONE GRAFT HARVEST, LEFT CERVIACL SIX THROUGH SEVEN CERVICAL FORAMINAL LAMINOPLASTY;  Surgeon: Kerrin Champagne, MD;  Location: [facility];  Service: Orthopedics;  Laterality: N/A;    Family History  Problem Relation Age of Onset   Hypertension Mother    Diabetes Mother    Cervical cancer Mother    Heart attack Father    Asthma Maternal Grandmother    Food Allergy Son    Seizures Son    Allergies Son    Colitis Son    GER disease Son    Irritable  bowel syndrome Son    Asthma Son    Food Allergy Son    Allergies Son    Other Son        eosinophilic esophagitis   Colitis Son    GER disease Son    Asthma Son    Colon cancer Neg Hx    Esophageal cancer Neg Hx    Stomach cancer Neg Hx    Pancreatic cancer Neg Hx    Colon polyps Neg Hx     Social history:  reports that he has been smoking cigarettes. He started smoking about 35 years ago. He has been smoking an average of 1 pack per day. He has never used smokeless tobacco. He reports that he does not currently use drugs. He reports that he does not drink alcohol.    Allergies  Allergen Reactions   Ibuprofen Rash    Medications:  Prior to Admission  medications   Medication Sig Start Date End Date Taking? Authorizing Provider  amitriptyline (ELAVIL) 75 MG tablet Take 1 tablet (75 mg total) by mouth at bedtime. 06/26/21  Yes Jessy Oto, MD  bisacodyl (DULCOLAX) 5 MG EC tablet Take 1 tablet (5 mg total) by mouth daily as needed for moderate constipation. 08/25/21  Yes Jessy Oto, MD  LINZESS 145 MCG CAPS capsule Take 145 mcg by mouth daily as needed for constipation. 09/04/21  Yes [provider]  metoprolol succinate (TOPROL XL) 25 MG 24 hr tablet Take 1 tablet (25 mg total) by mouth daily as needed (palpations). 10/04/21  Yes Revankar, Reita Cliche, MD  Morphine Sulfate ER 15 MG TBEA Take 15 tablets by mouth every 12 (twelve) hours as needed. 10/06/21  Yes Marybelle Killings, MD  oxyCODONE (ROXICODONE) 5 MG immediate release tablet Take 1 tablet (5 mg total) by mouth every 4 (four) hours as needed. 10/06/21 10/06/22 Yes Marybelle Killings, MD  pregabalin (LYRICA) 50 MG capsule Take 100mg  po morning and at night  . 10/06/21  Yes Marybelle Killings, MD  pregabalin (LYRICA) 75 MG capsule Take 75 mg by mouth daily. 08/15/21  Yes [provider]  tiZANidine (ZANAFLEX) 4 MG tablet Take 1 tablet (4 mg total) by mouth every 6 (six) hours. 07/17/21  Yes Kathrynn Ducking, MD    ROS:  Out  of a complete 14 system review of symptoms, the patient complains only of the following symptoms, and all other reviewed systems are negative.  Neck stiffness and pain Right arm numbness and discomfort Episodes of elevated heart rate    ________________________________________  Autonomic dysreflexia  Acute incomplete quadriplegia (HCC)  Cervical myelopathy (HCC)  Chronic pain syndrome  He continues to experience increased autonomic symptoms triggered with pain, eating larger meals and constipation.  Unfortunately, lamotrigine had not helped.  I will have him try oxcarbazepine.  I would also consider Vimpat.  Hopefully this will help with the pain which may allow [place] to reduce either or both of the amitriptyline and morphine medications that may be making the constipation worse. Continue other medications. Return in 6 months or sooner if there are new or worsening neurologic symptoms.  Follow Up Instructions: I discussed the assessment and treatment plan with the patient. The patient was provided an opportunity to ask questions and all were answered. The patient agreed with the plan and demonstrated an understanding of the instructions.    The patient was advised to call back or seek an in-person evaluation if the symptoms worsen or if the condition fails to improve as anticipated.  I provided 27 minutes of non-face-to-face time during this encounter.

## 2022-10-17 NOTE — Telephone Encounter (Signed)
..   Pt understands that although there may be some limitations with this type of visit, we will take all precautions to reduce any security or privacy concerns.  Pt understands that this will be treated like an in office visit and we will file with pt's insurance, and there may be a patient responsible charge related to this service. ? ?

## 2022-10-18 ENCOUNTER — Other Ambulatory Visit: Payer: Self-pay | Admitting: Physical Medicine and Rehabilitation

## 2022-10-18 ENCOUNTER — Encounter
Payer: Medicaid Other | Attending: Physical Medicine and Rehabilitation | Admitting: Physical Medicine and Rehabilitation

## 2022-10-18 ENCOUNTER — Encounter: Payer: Self-pay | Admitting: Physical Medicine and Rehabilitation

## 2022-10-18 VITALS — BP 133/88 | HR 96 | Ht 69.5 in | Wt 207.6 lb

## 2022-10-18 DIAGNOSIS — K592 Neurogenic bowel, not elsewhere classified: Secondary | ICD-10-CM

## 2022-10-18 DIAGNOSIS — G959 Disease of spinal cord, unspecified: Secondary | ICD-10-CM | POA: Diagnosis not present

## 2022-10-18 DIAGNOSIS — G894 Chronic pain syndrome: Secondary | ICD-10-CM | POA: Diagnosis present

## 2022-10-18 DIAGNOSIS — F332 Major depressive disorder, recurrent severe without psychotic features: Secondary | ICD-10-CM

## 2022-10-18 DIAGNOSIS — G8250 Quadriplegia, unspecified: Secondary | ICD-10-CM | POA: Diagnosis not present

## 2022-10-18 DIAGNOSIS — N319 Neuromuscular dysfunction of bladder, unspecified: Secondary | ICD-10-CM | POA: Diagnosis present

## 2022-10-18 DIAGNOSIS — G904 Autonomic dysreflexia: Secondary | ICD-10-CM

## 2022-10-18 MED ORDER — DULOXETINE HCL 60 MG PO CPEP: ORAL_CAPSULE | ORAL | 5 refills | Status: DC

## 2022-10-18 MED ORDER — ONDANSETRON 4 MG PO TBDP: 4.0000 mg | ORAL_TABLET | Freq: Three times a day (TID) | ORAL | 5 refills | Status: AC | PRN

## 2022-10-18 MED ORDER — MORPHINE SULFATE ER 15 MG PO TBCR: 15.0000 mg | EXTENDED_RELEASE_TABLET | Freq: Two times a day (BID) | ORAL | 0 refills | Status: DC

## 2022-10-18 MED ORDER — DULOXETINE HCL 20 MG PO CPEP: 20.0000 mg | ORAL_CAPSULE | Freq: Every day | ORAL | 5 refills | Status: DC

## 2022-10-18 MED ORDER — OXYCODONE HCL 10 MG PO TABS: 10.0000 mg | ORAL_TABLET | ORAL | 0 refills | Status: DC | PRN

## 2022-10-18 NOTE — Telephone Encounter (Signed)
Pharmacy notified us that insurance would not cover 3 capsules of duloxetine q hs. Rx changed to :  Ramp up dosing take one 20 mg capsule at hs x 1 week, then 2 capsules (40 mg) at hs x 1 week, then switch to 60 mg capsules 1 qhs thereafter.    Rx sent in for #30 of the 20 mg capsules, and then #30 of the 60 mg capsules w 5 refills.

## 2022-10-18 NOTE — Progress Notes (Signed)
Subjective:    Patient ID: Victor Huang, male    DOB: 1976/07/18, 46 y.o.   MRN: [id number]  HPI  Pt is a 46 yr old male with hx of incomplete quadriplegia  (C2 R hemi/quadriplegia ASIA D) Pt is a 46 yr old male with hx of incomplete quadriplegia  (C2 R hemi/quadriplegia ASIA D)   and s/p C5/7 posterior cervical fusion by Dr Otelia Sergeant- 06/18/21.  He now has chronic nerve and chronic pain since  surgery as well. 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  Is a C2 R hemi/SCI/quadriplegia- ASIA D. Now depressed with sleep issues as a result and neurogenic bowel/incontinence.    and s/p C5/7 posterior cervical fusion by Dr Otelia Sergeant- 06/18/21.  He now has chronic nerve and chronic pain since  surgery as well. 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  Is a C2 R hemi/SCI/quadriplegia- ASIA D. Now depressed with sleep issues as a result and neurogenic bowel/incontinence.    Called both placed for therapy- called 2x- left messages- but didn't hear back.  Prozac made his AD worse.  Lexapro didn't mix with meds.    Reflux is a problem right now- didn't sleep well last night-   Spasticity- worse with cold temperature-    Describing pain anywhere touched- feels like has bruise everywhere and also feels like has been run over by truck/getting flu-   Didn't respond well to TrP injections- had severe AD.  Swelling goes up and down-  Really bad days, he takes a third dose of Lyrica- helps.     Tried: Lexapro- didn't mix well with current regimen Hasn't tried Duloxetine   Pain Inventory Average Pain 4 Pain Right Now 4 My pain is sharp, burning, dull, stabbing, tingling, and aching  LOCATION OF PAIN  head neck shoulder  back and arm  BOWEL Number of stools per week: 21                     Oral laxative use Yes   Type of laxative miralax linzess  Enema or suppository use Yes  Incontinent Yes   BLADDER Normal Difficulty starting  stream Yes    Mobility walk without assistance how many minutes can you walk? 10 ability to climb steps?  yes do you drive?  no  Function disabled: date disabled 06/18/21 I need assistance with the following:  dressing, bathing, meal prep, household duties, and shopping  Neuro/Psych bladder control problems bowel control problems weakness numbness tremor tingling spasms dizziness depression anxiety  Prior Studies Any changes since last visit?  no  Physicians involved in your care Any changes since last visit?  no   Family History  Problem Relation Age of Onset   Hypertension Mother    Diabetes Mother    Cervical cancer Mother    Heart attack Father    Asthma Maternal Grandmother    Food Allergy Son    Seizures Son    Allergies Son    Colitis Son    GER disease Son    Irritable bowel syndrome Son    Asthma Son    Food Allergy Son    Allergies Son    Other Son        eosinophilic esophagitis   Colitis Son    GER disease Son    Asthma Son    Colon cancer Neg Hx    Esophageal cancer Neg Hx    Stomach cancer  Neg Hx    Pancreatic cancer Neg Hx    Colon polyps Neg Hx    Social History   Socioeconomic History   Marital status: Married    Spouse name: Not on file   Number of children: 2   Years of education: Not on file   Highest education level: Not on file  Occupational History   Not on file  Tobacco Use   Smoking status: Every Day    Packs/day: 1.00    Types: Cigarettes    Start date: 01/01/1987   Smokeless tobacco: Never  Vaping Use   Vaping Use: Never used  Substance and Sexual Activity   Alcohol use: Never    Comment: no beer in over 15 years   Drug use: Not Currently   Sexual activity: Yes  Other Topics Concern   Not on file  Social History Narrative   Not on file   Social Determinants of Health   Financial Resource Strain: Not on file  Food Insecurity: Not on file  Transportation Needs: Not on file  Physical Activity: Not on file   Stress: Not on file  Social Connections: Not on file   Past Surgical History:  Procedure Laterality Date   BIOPSY  04/08/2022   Procedure: BIOPSY;  Surgeon: Imogene Burn, MD;  Location: [facility];  Service: Gastroenterology;;   COLONOSCOPY WITH PROPOFOL N/A 04/08/2022   Procedure: COLONOSCOPY WITH PROPOFOL;  Surgeon: Imogene Burn, MD;  Location: [facility];  Service: Gastroenterology;  Laterality: N/A;   ESOPHAGOGASTRODUODENOSCOPY (EGD) WITH PROPOFOL N/A 04/08/2022   Procedure: ESOPHAGOGASTRODUODENOSCOPY (EGD) WITH PROPOFOL;  Surgeon: Imogene Burn, MD;  Location: [facility];  Service: Gastroenterology;  Laterality: N/A;   POLYPECTOMY  04/08/2022   Procedure: POLYPECTOMY;  Surgeon: Imogene Burn, MD;  Location: [facility];  Service: Gastroenterology;;   POSTERIOR CERVICAL FUSION/FORAMINOTOMY N/A 06/18/2021   Procedure: POSTERIOR CERVICAL FUSION CERVICAL FIVE THROUGH SIX WITH TRIPLE WIRE TECHNIQUE FIXATION AND RIGHT ILIAC CREST BONE GRAFT HARVEST, LEFT CERVIACL SIX THROUGH SEVEN CERVICAL FORAMINAL LAMINOPLASTY;  Surgeon: Kerrin Champagne, MD;  Location: [facility];  Service: Orthopedics;  Laterality: N/A;   Past Medical History:  Diagnosis Date   Acute incomplete quadriplegia (HCC) 11/02/2021   Anxiety disorder    Asthma    as a child   Autonomic dysreflexia    Cervical myelopathy (HCC) 06/22/2021   Chest pain of uncertain etiology 10/04/2021   Chronic pain syndrome 11/02/2021   Cigarette smoker 10/04/2021   Depression    Diverticulosis    Essential (primary) hypertension    GERD (gastroesophageal reflux disease)    Headache    Neurogenic bowel 02/04/2022   Other spondylosis with radiculopathy, cervical region 06/18/2021   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    Palpitations 10/04/2021   Pneumonia    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/2021   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   Spasticity 11/02/2021   Status post cervical spinal fusion 06/18/2021   BP 133/88   Pulse 96   Ht 5'  9.5" (1.765 m)   Wt 207 lb 9.6 oz (94.2 kg)   SpO2 94%   BMI 30.22 kg/m   Opioid Risk Score:   Fall Risk Score:  `1  Depression screen [facility] 2/9     10/18/2022    9:44 AM 06/17/2022    9:14 AM 02/04/2022    9:05 AM 11/02/2021    9:39 AM  Depression screen PH[facility] 2/9  Decreased Interest 1 3 0 0  Down, Depressed, Hopeless 1  3 0 3  PHQ - 2 Score 2 6 0 3  Altered sleeping    3  Tired, decreased energy    0  Change in appetite    0  Feeling bad or failure about yourself     0  Trouble concentrating    0  Moving slowly or fidgety/restless    0  Suicidal thoughts    0  PHQ-9 Score    6     Review of Systems  Constitutional:  Positive for appetite change and unexpected weight change.       Gain  HENT: Negative.    Eyes: Negative.   Respiratory:  Positive for apnea and cough.   Cardiovascular:  Positive for leg swelling.  Gastrointestinal:  Positive for abdominal pain, constipation, diarrhea and nausea.  Endocrine: Negative.   Genitourinary:  Positive for difficulty urinating.  Musculoskeletal:  Positive for gait problem.  Skin: Negative.   Allergic/Immunologic: Negative.   Neurological:  Positive for tremors, weakness and numbness.       Tingling  Hematological: Negative.   Psychiatric/Behavioral:  Positive for dysphoric mood. The patient is nervous/anxious.   All other systems reviewed and are negative.      Objective:   Physical Exam  Awake, alert, appears ill; accompanied by wife, NAD Turning red- cheeks- AD?       Assessment & Plan:   Pt is a 46 yr old male with hx of incomplete quadriplegia  (C2 R hemi/quadriplegia ASIA D) Pt is a 46 yr old male with hx of incomplete quadriplegia  (C2 R hemi/quadriplegia ASIA D)   and s/p C5/7 posterior cervical fusion by Dr Louanne Skye- 06/18/21.  He now has chronic nerve and chronic pain since  surgery as well. 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  Is a C2 R hemi/SCI/quadriplegia- ASIA D. Now  depressed with sleep issues as a result and neurogenic bowel/incontinence.    and s/p C5/7 posterior cervical fusion by Dr Louanne Skye- 06/18/21.  He now has chronic nerve and chronic pain since  surgery as well. 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  Is a C2 R hemi/SCI/quadriplegia- ASIA D. Now depressed with sleep issues as a result and neurogenic bowel/incontinence.  Also has post traumatic fibromyalgia- everything is painful to touch. Will try Cymbalta-   Duloxetine/Cymbalta-Start Duloxetine/Cymbalta 20 mg nightly x 1 week Then 40 mg nightly x 1 week  Then 60 mg nightly- for nerve pain 1% of patients can have nausea with Duloxetine- call me if needs an anti-nausea medicine. Can also cause mild dry mouth/dry eyes and mild constipation.  2. Wait taking Oxcarbemazepine- until get Duloxetine on board. Or afterwards if Duloxetine doesn't work.   3. Don't use Baclofen- put on allergy list.   4. Get online- for [place] medicaid- and look for therapists- try to find a place that would see him. Hopefully can get him in soon. Due to depressed mood.     5. Cannot do Trigger point injections- causes more AD.    6. Has problems with Zofran by itself- hard to swallow/makes nauseated- will try dissolving Zofran 4 mg 3x/day as needed for nausea #90 5 refills   7. Sent in MS Contin 15 mg BID and Oxyocdone 10 mg q4-5 hours prn #150- to be refilled after 11/6  8. No other med changes until know about Duloxetine-   9. F/U in 3 months- double appt. Let me know about therapy.

## 2022-10-18 NOTE — Patient Instructions (Signed)
Pt is a 46 yr old male with hx of incomplete quadriplegia  (C2 R hemi/quadriplegia ASIA D) Pt is a 46 yr old male with hx of incomplete quadriplegia  (C2 R hemi/quadriplegia ASIA D)   and s/p C5/7 posterior cervical fusion by Dr Louanne Skye- 06/18/21.  He now has chronic nerve and chronic pain since  surgery as well. 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  Is a C2 R hemi/SCI/quadriplegia- ASIA D. Now depressed with sleep issues as a result and neurogenic bowel/incontinence.    and s/p C5/7 posterior cervical fusion by Dr Louanne Skye- 06/18/21.  He now has chronic nerve and chronic pain since  surgery as well. 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  Is a C2 R hemi/SCI/quadriplegia- ASIA D. Now depressed with sleep issues as a result and neurogenic bowel/incontinence.  Also has post traumatic fibromyalgia- everything is painful to touch. Will try Cymbalta-   Duloxetine/Cymbalta-Start Duloxetine/Cymbalta 20 mg nightly x 1 week Then 40 mg nightly x 1 week  Then 60 mg nightly- for nerve pain 1% of patients can have nausea with Duloxetine- call me if needs an anti-nausea medicine. Can also cause mild dry mouth/dry eyes and mild constipation.  2. Wait taking Oxcarbemazepine- until get Duloxetine on board. Or afterwards if Duloxetine doesn't work.   3. Don't use Baclofen- put on allergy list.   4. Get online- for [place] medicaid- and look for hterapists- try to find a place that would see him.    5. Cannot do Trigger point injections- causes more AD.    6. Has problems with Zofran by itself- hard to swallow/makes nauseated- will try dissolving Zofran 4 mg 3x/day as needed for nausea #90 5 refills   7. Sent in MS Contin 15 mg BID and Oxyocdone 10 mg q4-5 hours prn #150- to be refilled after 11/6  8. No other med changes until know about Duloxetine-   9. F/U in 3 months- double appt.

## 2022-10-21 ENCOUNTER — Encounter: Payer: Self-pay | Admitting: Physical Medicine and Rehabilitation

## 2022-11-12 ENCOUNTER — Other Ambulatory Visit: Payer: Self-pay | Admitting: Physical Medicine and Rehabilitation

## 2022-11-24 ENCOUNTER — Other Ambulatory Visit: Payer: Self-pay | Admitting: Neurology

## 2022-11-24 ENCOUNTER — Other Ambulatory Visit: Payer: Self-pay | Admitting: Physical Medicine and Rehabilitation

## 2022-11-27 ENCOUNTER — Other Ambulatory Visit: Payer: Self-pay | Admitting: Physical Medicine and Rehabilitation

## 2022-11-28 MED ORDER — MORPHINE SULFATE ER 15 MG PO TBCR: 15.0000 mg | EXTENDED_RELEASE_TABLET | Freq: Two times a day (BID) | ORAL | 0 refills | Status: DC

## 2022-11-28 MED ORDER — OXYCODONE HCL 10 MG PO TABS: 10.0000 mg | ORAL_TABLET | ORAL | 0 refills | Status: DC | PRN

## 2022-11-28 NOTE — Telephone Encounter (Signed)
PMP was Reviewed.  Dr Berline Chough note was Reviewed.  Morphine and Oxycodone e-scribed to pharmacy.

## 2022-12-02 ENCOUNTER — Telehealth: Payer: Self-pay | Admitting: *Deleted

## 2022-12-02 ENCOUNTER — Other Ambulatory Visit: Payer: Self-pay | Admitting: Physical Medicine and Rehabilitation

## 2022-12-02 ENCOUNTER — Encounter: Payer: Self-pay | Admitting: Physical Medicine and Rehabilitation

## 2022-12-02 NOTE — Telephone Encounter (Signed)
Prior [place] submitted and approved by insurance via CoverMyMeds. Approved through 05/31/23

## 2022-12-26 ENCOUNTER — Other Ambulatory Visit: Payer: Self-pay | Admitting: Internal Medicine

## 2022-12-26 ENCOUNTER — Telehealth: Payer: Self-pay

## 2022-12-26 NOTE — Telephone Encounter (Signed)
PA submitted for Oxycodone and Morphine. Morphine no PA required.

## 2022-12-27 ENCOUNTER — Other Ambulatory Visit: Payer: Self-pay | Admitting: Physical Medicine and Rehabilitation

## 2022-12-27 MED ORDER — OXYCODONE HCL 10 MG PO TABS: 10.0000 mg | ORAL_TABLET | ORAL | 0 refills | Status: DC | PRN

## 2022-12-27 MED ORDER — MORPHINE SULFATE ER 15 MG PO TBCR: 15.0000 mg | EXTENDED_RELEASE_TABLET | Freq: Two times a day (BID) | ORAL | 0 refills | Status: DC

## 2022-12-27 NOTE — Telephone Encounter (Signed)
From: [name] To: Office of Jacalyn Lefevre, NP Sent: 12/24/2022 11:23 PM EST Subject: Medication Renewal Request  Refills have been requested for the following medications:   morphine (MS CONTIN) 15 MG 12 hr tablet [Eunice Thomas]   Oxycodone HCl 10 MG TABS [Eunice Thomas]  Preferred pharmacy: [id number] - [place], [place] - [address] Delivery method: [employer]

## 2022-12-31 ENCOUNTER — Encounter: Payer: Self-pay | Admitting: Physical Medicine and Rehabilitation

## 2022-12-31 DIAGNOSIS — G8250 Quadriplegia, unspecified: Secondary | ICD-10-CM

## 2022-12-31 DIAGNOSIS — N521 Erectile dysfunction due to diseases classified elsewhere: Secondary | ICD-10-CM

## 2023-01-01 ENCOUNTER — Telehealth: Payer: Self-pay

## 2023-01-01 MED ORDER — DANTROLENE SODIUM 50 MG PO CAPS: 50.0000 mg | ORAL_CAPSULE | Freq: Every evening | ORAL | 5 refills | Status: DC

## 2023-01-01 NOTE — Telephone Encounter (Signed)
Auth approved for Oxycodone HCl tablet valid 12/26/22-06/24/2023

## 2023-01-01 NOTE — Telephone Encounter (Signed)
Taking zanaflex 4 mg QID, not 8 mg QID. Couldn't tolerate sedation.   Baclofen- tried in past- has side effects- dizzy with it.  Dantrolene- 50 mg nightly x 1 week, then 2x/day x 1 week then 3x/day for spasticity treatment. Has no Hx of LFT elevation.  Will need since has failed Baclofen and too sedated with  Zanaflex to tolerate higher doses.  Valium- would like ot wait to try since on opiates  Will check LFTs at next visit- late January.   Will also place Urology consult due to sensory deficits, inability to get erection/ejaculation. Need to see Neuro-Urology and [facility] [place] doesn't take Medicaid.   Will place with Dr Johnney Ou in [place]- he's great.

## 2023-01-01 NOTE — Telephone Encounter (Signed)
PA Submitted for Dantrolene Sodium Capsules Key#: [id number]

## 2023-01-06 NOTE — Telephone Encounter (Signed)
PA approved for Dantrolene sodium capsule starting 01/01/23 until further notice per insurance approved for 90 capsules per every 30 days.

## 2023-01-20 ENCOUNTER — Encounter: Payer: Self-pay | Admitting: Physical Medicine and Rehabilitation

## 2023-01-20 ENCOUNTER — Encounter
Payer: Medicaid Other | Attending: Physical Medicine and Rehabilitation | Admitting: Physical Medicine and Rehabilitation

## 2023-01-20 VITALS — BP 128/90 | HR 91 | Ht 69.5 in | Wt 204.8 lb

## 2023-01-20 DIAGNOSIS — N521 Erectile dysfunction due to diseases classified elsewhere: Secondary | ICD-10-CM | POA: Diagnosis present

## 2023-01-20 DIAGNOSIS — G894 Chronic pain syndrome: Secondary | ICD-10-CM | POA: Diagnosis not present

## 2023-01-20 DIAGNOSIS — Z5181 Encounter for therapeutic drug level monitoring: Secondary | ICD-10-CM | POA: Diagnosis not present

## 2023-01-20 DIAGNOSIS — G8250 Quadriplegia, unspecified: Secondary | ICD-10-CM

## 2023-01-20 DIAGNOSIS — Z79891 Long term (current) use of opiate analgesic: Secondary | ICD-10-CM | POA: Diagnosis not present

## 2023-01-20 DIAGNOSIS — G904 Autonomic dysreflexia: Secondary | ICD-10-CM | POA: Diagnosis present

## 2023-01-20 DIAGNOSIS — R252 Cramp and spasm: Secondary | ICD-10-CM

## 2023-01-20 MED ORDER — OXYCODONE HCL 10 MG PO TABS: 10.0000 mg | ORAL_TABLET | ORAL | 0 refills | Status: DC | PRN

## 2023-01-20 MED ORDER — SILDENAFIL CITRATE 100 MG PO TABS: 100.0000 mg | ORAL_TABLET | Freq: Every day | ORAL | 5 refills | Status: DC | PRN

## 2023-01-20 MED ORDER — LINZESS 290 MCG PO CAPS
290.0000 ug | ORAL_CAPSULE | Freq: Every day | ORAL | 1 refills | Status: DC
Start: 2023-01-20 — End: 2023-11-10

## 2023-01-20 MED ORDER — MORPHINE SULFATE ER 15 MG PO TBCR: 15.0000 mg | EXTENDED_RELEASE_TABLET | Freq: Two times a day (BID) | ORAL | 0 refills | Status: DC

## 2023-01-20 MED ORDER — PREGABALIN 150 MG PO CAPS
150.0000 mg | ORAL_CAPSULE | Freq: Four times a day (QID) | ORAL | 1 refills | Status: DC
Start: 2023-01-20 — End: 2023-08-08

## 2023-01-20 NOTE — Progress Notes (Signed)
Subjective:    Patient ID: Victor Huang, male    DOB: 1976-10-23, 47 y.o.   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  He now has chronic nerve and chronic pain since  surgery as well. 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  Is a C2 R hemi/SCI/quadriplegia- ASIA D. Now depressed with sleep issues as a result and neurogenic bowel/incontinence.    Here for f/u on SCI.   Erectile dysfunction- big difference in size and then- cannot finish/cannot ejaculate.  Requires more of an effort to get erection and keep it up.  Even when tries on his own-  Cannot orgasm or ejaculate. Didn't feel like would get there.  Only tried 4x since injury- can bring on AD episode most times.     Haven't tried Viagra, etc.    Had appt with Dr Hall Busing- but had to reschedule.    Status quo pain wise.   More episodes of AD due to cold weather-  Spasticity also worse with AD  Dantrolene has helped spasticity- taking 50 mg 3x/day right now- makes him sleepy- goes to sleep early- 9-10 pm and wakes up 10-11 am and nods off in chair in afternoon. Jerking is much better with Dantrolene.   Sees Pulmonary for sleep apnea this week- chokes every night.   Had the flu this month- doing better now 01/04/23.  Had Tamiflu- was sick for 24 hours.  Got over it fast. Whole house got sick- didn't get flu shot.   Took flu shot 3x in life- feels like gets really sick/flu after shots.    No falls lately.    Pain Inventory Average Pain 4 Pain Right Now 6 My pain is sharp, burning, dull, stabbing, tingling, and aching  LOCATION OF PAIN  head, neck, shoulder, back  BOWEL Number of stools per week: 3+ Oral laxative use Yes  Type of laxative Miralax,  linzess Enema or suppository use Yes  History of colostomy No  Incontinent Yes   BLADDER Normal In and out cath, frequency . Able to self cath  . Bladder incontinence Yes  Frequent urination No  Leakage with coughing No  Difficulty starting stream Yes  Incomplete bladder emptying No    Mobility walk without assistance how many minutes can you walk? 10 ability to climb steps?  yes do you drive?  no  Function disabled: date disabled 06/20/21 I need assistance with the following:  dressing, bathing, meal prep, household duties, and shopping  Neuro/Psych bowel control problems weakness numbness tremor tingling spasms dizziness confusion  Prior Studies Any changes since last visit?  no  Physicians involved in your care Any changes since last visit?  no   Family History  Problem Relation Age of Onset   Hypertension Mother    Diabetes Mother    Cervical cancer Mother    Heart attack Father    Asthma Maternal Grandmother    Food Allergy Son    Seizures Son    Allergies Son    Colitis Son    GER disease Son    Irritable bowel syndrome Son    Asthma Son    Food Allergy Son    Allergies Son  Other Son        eosinophilic esophagitis   Colitis Son    GER disease Son    Asthma Son    Colon cancer Neg Hx    Esophageal cancer Neg Hx    Stomach cancer Neg Hx    Pancreatic cancer Neg Hx    Colon polyps Neg Hx    Social History   Socioeconomic History   Marital status: Married    Spouse name: Not on file   Number of children: 2   Years of education: Not on file   Highest education level: Not on file  Occupational History   Not on file  Tobacco Use   Smoking status: Every Day    Packs/day: 1.00    Types: Cigarettes    Start date: 01/01/1987   Smokeless tobacco: Never  Vaping Use   Vaping Use: Never used  Substance and Sexual Activity   Alcohol use: Never    Comment: no beer in over 15 years   Drug use: Not Currently   Sexual activity: Yes   Other Topics Concern   Not on file  Social History Narrative   Not on file   Social Determinants of Health   Financial Resource Strain: Not on file  Food Insecurity: Not on file  Transportation Needs: Not on file  Physical Activity: Not on file  Stress: Not on file  Social Connections: Not on file   Past Surgical History:  Procedure Laterality Date   BIOPSY  04/08/2022   Procedure: BIOPSY;  Surgeon: Sharyn Creamer, MD;  Location: [facility];  Service: Gastroenterology;;   COLONOSCOPY WITH PROPOFOL N/A 04/08/2022   Procedure: COLONOSCOPY WITH PROPOFOL;  Surgeon: Sharyn Creamer, MD;  Location: [facility];  Service: Gastroenterology;  Laterality: N/A;   ESOPHAGOGASTRODUODENOSCOPY (EGD) WITH PROPOFOL N/A 04/08/2022   Procedure: ESOPHAGOGASTRODUODENOSCOPY (EGD) WITH PROPOFOL;  Surgeon: Sharyn Creamer, MD;  Location: [facility];  Service: Gastroenterology;  Laterality: N/A;   POLYPECTOMY  04/08/2022   Procedure: POLYPECTOMY;  Surgeon: Sharyn Creamer, MD;  Location: [facility];  Service: Gastroenterology;;   POSTERIOR CERVICAL FUSION/FORAMINOTOMY N/A 06/18/2021   Procedure: POSTERIOR CERVICAL FUSION CERVICAL FIVE THROUGH SIX WITH TRIPLE WIRE TECHNIQUE FIXATION AND RIGHT ILIAC CREST BONE GRAFT HARVEST, LEFT CERVIACL SIX THROUGH SEVEN CERVICAL FORAMINAL LAMINOPLASTY;  Surgeon: Jessy Oto, MD;  Location: [facility];  Service: Orthopedics;  Laterality: N/A;   Past Medical History:  Diagnosis Date   Acute incomplete quadriplegia ([place]) 11/02/2021   Anxiety disorder    Asthma    as a child   Autonomic dysreflexia    Cervical myelopathy ([place]) 06/22/2021   Chest pain of uncertain etiology 53/05/6439   Chronic pain syndrome 11/02/2021   Cigarette smoker 10/04/2021   Depression    Diverticulosis    Essential (primary) hypertension    GERD (gastroesophageal reflux disease)    Headache    Neurogenic bowel 02/04/2022   Other spondylosis with radiculopathy, cervical region 06/18/2021   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    Palpitations 10/04/2021   Pneumonia    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/2021   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   Spasticity 11/02/2021   Status post cervical spinal fusion 06/18/2021   BP (!) 128/90   Pulse 91   Ht 5' 9.5" (1.765 m)   Wt 204 lb 12.8 oz (92.9 kg)   SpO2 97%   BMI 29.81 kg/m   Opioid Risk Score:   Fall Risk Score:  `1  Depression screen PHQ  2/9     10/18/2022    9:44 AM 06/17/2022    9:14 AM 02/04/2022    9:05 AM 11/02/2021    9:39 AM  Depression screen PHQ 2/9  Decreased Interest 1 3 0 0  Down, Depressed, Hopeless 1 3 0 3  PHQ - 2 Score 2 6 0 3  Altered sleeping    3  Tired, decreased energy    0  Change in appetite    0  Feeling bad or failure about yourself     0  Trouble concentrating    0  Moving slowly or fidgety/restless    0  Suicidal thoughts    0  PHQ-9 Score    6     Review of Systems  Constitutional:  Positive for appetite change.  Respiratory:  Positive for cough.   Gastrointestinal:  Positive for abdominal pain, constipation, diarrhea and nausea.  Musculoskeletal:  Positive for back pain and neck pain.       Shoulder pain  Neurological:  Positive for headaches.  All other systems reviewed and are negative.     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  He now has chronic nerve and chronic pain since  surgery as well. Just had stress test from [facility]- due to dysautonomia he's  describing.  Is a C2 R hemi/SCI/quadriplegia- ASIA D. Now depressed with sleep issues as a result and neurogenic bowel/incontinence.   Also has erectile dysfunction.   Here for f/u on SCI.    Will try Viagra-  100 mg 30 minutes before intimacy- [facility] or [facility] if more than $20.   Don't try Nitroglycerin when would take Viagra- only has for AD- so needs at least 2-3 hours between Nitroglycerin and Viagra so doesn't lower BP too much.   3. Referred to Dr Hall Busing since has reduced size of penis and erectile issues- try Viagra before sees Dr Katrinka Blazing   4. Change Dantrolene to 50 mg in AM and 100 mg nightly- don't increase dose.   5. Hand swelling is likely from Lyrica- however I don't see an easy way to get off Lyrica- - compression gloves at night can help- can have full glove or cut out for tips of fingers only- can wear at night and as needed. Shown what to get.   6. Con't Linzess and Lyrica- will refill meds and change Lyrica to 150 mg 4x/day- but 3 months supply with 1 refill.   7. Refill MS Contin 15 mg BID and Oxyocdone 10 mg q4 hours #150- due late January.   8. Con't Zanaflex 8 mg 4x/day; and Zofran as needed- doesn't need refills.   9. Con't Duloxetine- 60 mg daily- con't regimen.   10. F/U in 3 months- double visit-   11. UDS due to day- still has opiate contract.   12. Add CMP for starting Dantrolene- need  to monitor LFTs and kidneys   I spent a total of  36  minutes on total care today- >50% coordination of care- due to discussion about erectile issues as well as spasticity

## 2023-01-20 NOTE — Patient Instructions (Addendum)
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  He now has chronic nerve and chronic pain since  surgery as well. 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  Is a C2 R hemi/SCI/quadriplegia- ASIA D. Now depressed with sleep issues as a result and neurogenic bowel/incontinence.   Also has erectile dysfunction.   Here for f/u on SCI.    Will try Viagra-  100 mg 30 minutes before intimacy- [facility] if more than $20.   Don't try Nitroglycerin when would take Viagra- only has for AD- so needs at least 2-3 hours between Nitroglycerin and Viagra so doesn't lower BP too much.   3. Referred to Dr Johnney Ou since has reduced size of penis and erectile issues- try Viagra before sees Dr Tamala Julian   4. Change Dantrolene to 50 mg in AM and 100 mg nightly- don't increase dose.   5. Hand swelling is likely from Lyrica- however I don't see an easy way to get off Lyrica- - compression gloves at night can help- can have full glove or cut out for tips of fingers only- can wear at night and as needed. Shown what to get.   6. Con't Linzess and Lyrica- will refill meds and change Lyrica to 150 mg 4x/day- but 3 months supply with 1 refill.   7. Refill MS Contin 15 mg BID and Oxyocdone 10 mg q4 hours #150- due late January.   8. Con't Zanaflex 8 mg 4x/day; and Zofran as needed- doesn't need refills.   9. Con't Duloxetine- 60 mg daily- con't regimen.   10. F/U in 3 months- double visit-    11. UDS today- still has opiate contract

## 2023-01-21 ENCOUNTER — Telehealth: Payer: Self-pay | Admitting: Physical Medicine and Rehabilitation

## 2023-01-21 LAB — COMPREHENSIVE METABOLIC PANEL
ALT: 23 IU/L (ref 0–44)
AST: 18 IU/L (ref 0–40)
Albumin/Globulin Ratio: 1.5 (ref 1.2–2.2)
Albumin: 4.3 g/dL (ref 4.1–5.1)
Alkaline Phosphatase: 85 IU/L (ref 44–121)
BUN/Creatinine Ratio: 21 — ABNORMAL HIGH (ref 9–20)
BUN: 17 mg/dL (ref 6–24)
Bilirubin Total: 0.2 mg/dL (ref 0.0–1.2)
CO2: 24 mmol/L (ref 20–29)
Calcium: 9.4 mg/dL (ref 8.7–10.2)
Chloride: 104 mmol/L (ref 96–106)
Creatinine, Ser: 0.8 mg/dL (ref 0.76–1.27)
Globulin, Total: 2.9 g/dL (ref 1.5–4.5)
Glucose: 96 mg/dL (ref 70–99)
Potassium: 4.8 mmol/L (ref 3.5–5.2)
Sodium: 144 mmol/L (ref 134–144)
Total Protein: 7.2 g/dL (ref 6.0–8.5)
eGFR: 112 mL/min/{1.73_m2} (ref >59)

## 2023-01-21 MED ORDER — SILDENAFIL CITRATE 100 MG PO TABS
100.0000 mg | ORAL_TABLET | Freq: Every day | ORAL | 5 refills | Status: AC | PRN
Start: 2023-01-21 — End: ?

## 2023-01-21 NOTE — Telephone Encounter (Signed)
Patient would like for his prescription for Viagra sent to [facility] in [place], it was too expensive at other pharmacy.

## 2023-01-22 LAB — TOXASSURE SELECT,+ANTIDEPR,UR

## 2023-02-05 ENCOUNTER — Other Ambulatory Visit: Payer: Self-pay

## 2023-02-05 ENCOUNTER — Emergency Department (HOSPITAL_COMMUNITY)
Admission: EM | Admit: 2023-02-05 | Discharge: 2023-02-06 | Disposition: A | Discharge status: Home / Self Care | Payer: Medicaid Other | Attending: Emergency Medicine | Admitting: Emergency Medicine

## 2023-02-05 ENCOUNTER — Encounter (HOSPITAL_COMMUNITY): Payer: Self-pay | Admitting: *Deleted

## 2023-02-05 DIAGNOSIS — Z79899 Other long term (current) drug therapy: Secondary | ICD-10-CM | POA: Insufficient documentation

## 2023-02-05 DIAGNOSIS — J45909 Unspecified asthma, uncomplicated: Secondary | ICD-10-CM | POA: Diagnosis not present

## 2023-02-05 DIAGNOSIS — I10 Essential (primary) hypertension: Secondary | ICD-10-CM | POA: Insufficient documentation

## 2023-02-05 DIAGNOSIS — F17210 Nicotine dependence, cigarettes, uncomplicated: Secondary | ICD-10-CM | POA: Diagnosis not present

## 2023-02-05 DIAGNOSIS — R1031 Right lower quadrant pain: Secondary | ICD-10-CM | POA: Diagnosis not present

## 2023-02-05 LAB — COMPREHENSIVE METABOLIC PANEL
ALT: 28 U/L (ref 0–44)
AST: 27 U/L (ref 15–41)
Albumin: 3.8 g/dL (ref 3.5–5.0)
Alkaline Phosphatase: 64 U/L (ref 38–126)
Anion gap: 11 (ref 5–15)
BUN: 16 mg/dL (ref 6–20)
CO2: 25 mmol/L (ref 22–32)
Calcium: 9.1 mg/dL (ref 8.9–10.3)
Chloride: 105 mmol/L (ref 98–111)
Creatinine, Ser: 0.95 mg/dL (ref 0.61–1.24)
GFR, Estimated: >60 mL/min (ref >60)
Glucose, Bld: 101 mg/dL — ABNORMAL HIGH (ref 70–99)
Potassium: 4.4 mmol/L (ref 3.5–5.1)
Sodium: 141 mmol/L (ref 135–145)
Total Bilirubin: 0.6 mg/dL (ref 0.3–1.2)
Total Protein: 6.9 g/dL (ref 6.5–8.1)

## 2023-02-05 LAB — CBC WITH DIFFERENTIAL/PLATELET
Abs Immature Granulocytes: 0.05 10*3/uL (ref 0.00–0.07)
Basophils Absolute: 0.2 10*3/uL — ABNORMAL HIGH (ref 0.0–0.1)
Basophils Relative: 1 %
Eosinophils Absolute: 0.8 10*3/uL — ABNORMAL HIGH (ref 0.0–0.5)
Eosinophils Relative: 4 %
HCT: 44.9 % (ref 39.0–52.0)
Hemoglobin: 15.5 g/dL (ref 13.0–17.0)
Immature Granulocytes: 0 %
Lymphocytes Relative: 36 %
Lymphs Abs: 6.2 10*3/uL — ABNORMAL HIGH (ref 0.7–4.0)
MCH: 29.2 pg (ref 26.0–34.0)
MCHC: 34.5 g/dL (ref 30.0–36.0)
MCV: 84.6 fL (ref 80.0–100.0)
Monocytes Absolute: 1.3 10*3/uL — ABNORMAL HIGH (ref 0.1–1.0)
Monocytes Relative: 8 %
Neutro Abs: 8.7 10*3/uL — ABNORMAL HIGH (ref 1.7–7.7)
Neutrophils Relative %: 51 %
Platelets: 344 10*3/uL (ref 150–400)
RBC: 5.31 MIL/uL (ref 4.22–5.81)
RDW: 13.4 % (ref 11.5–15.5)
Smear Review: NORMAL
WBC: 17.2 10*3/uL — ABNORMAL HIGH (ref 4.0–10.5)
nRBC: 0 % (ref 0.0–0.2)

## 2023-02-05 LAB — LIPASE, BLOOD: Lipase: 40 U/L (ref 11–51)

## 2023-02-05 LAB — URINALYSIS, ROUTINE W REFLEX MICROSCOPIC
Bacteria, UA: NONE SEEN
Bilirubin Urine: NEGATIVE
Glucose, UA: NEGATIVE mg/dL
Hgb urine dipstick: NEGATIVE
Ketones, ur: NEGATIVE mg/dL
Leukocytes,Ua: NEGATIVE
Nitrite: NEGATIVE
Protein, ur: NEGATIVE mg/dL
Specific Gravity, Urine: 1.018 (ref 1.005–1.030)
pH: 6 (ref 5.0–8.0)

## 2023-02-05 MED ORDER — ONDANSETRON HCL 4 MG/2ML IJ SOLN
4.0000 mg | Freq: Once | INTRAMUSCULAR | Status: AC
  Administered 2023-02-06: 4 mg via INTRAVENOUS
  Filled 2023-02-05: qty 2

## 2023-02-05 MED ORDER — MORPHINE SULFATE (PF) 4 MG/ML IV SOLN
4.0000 mg | Freq: Once | INTRAVENOUS | Status: AC
  Administered 2023-02-06: 4 mg via INTRAVENOUS
  Filled 2023-02-05: qty 1

## 2023-02-05 MED ORDER — LORAZEPAM 2 MG/ML IJ SOLN
1.0000 mg | Freq: Once | INTRAMUSCULAR | Status: AC
  Administered 2023-02-06: 1 mg via INTRAVENOUS
  Filled 2023-02-05: qty 1

## 2023-02-05 NOTE — ED Triage Notes (Signed)
The pt has had a spinal cord injury 2022 from surgery.  He has episodes of rt abd pain which he has just now  he was started on  a new med 2-3 weeks ago he also has spasms and everything gets worse when his bowels  do not  work properly

## 2023-02-05 NOTE — ED Notes (Signed)
Pts wife requested a BP recheck, BP was 147/100.

## 2023-02-05 NOTE — ED Provider Triage Note (Signed)
  Emergency Medicine Provider Triage Evaluation Note  MRN:  [id number]  Arrival date & time: 02/05/23    Medically screening exam initiated at 10:17 PM.   CC:   Abdominal Pain   HPI:  Victor Huang is a 47 y.o. year-old male presents to the ED with chief complaint of right lower quadrant abdominal pain. Hx of autonomic dysreflexia and incomplete quadriplegia .  History provided by patient. ROS:  -As included in HPI PE:   Vitals:   02/05/23 2203  BP: (!) 148/103  Pulse: 90  Resp: 17  Temp: 98.7 F (37.1 C)  SpO2: 100%    Non-toxic appearing No respiratory distress RLQ TTP MDM:  Abdominal pain I've ordered ct and labs in triage to expedite lab/diagnostic workup.  Patient was informed that the remainder of the evaluation will be completed by another provider, this initial triage assessment does not replace that evaluation, and the importance of remaining in the ED until their evaluation is complete.    Montine Circle, PA-C 02/05/23 2219

## 2023-02-06 ENCOUNTER — Emergency Department (HOSPITAL_COMMUNITY): Payer: Medicaid Other

## 2023-02-06 MED ORDER — IOHEXOL 350 MG/ML SOLN
75.0000 mL | Freq: Once | INTRAVENOUS | Status: AC | PRN
  Administered 2023-02-06: 75 mL via INTRAVENOUS

## 2023-02-06 NOTE — Discharge Instructions (Signed)
You were seen today for abdominal pain.  Your workup is reassuring.  Follow-up with your primary physicians.  Continue your bowel regimen.

## 2023-02-06 NOTE — ED Provider Notes (Signed)
[facility] Provider Note   CSN: [id number] Arrival date & time: 02/05/23  2141     History  Chief Complaint  Patient presents with   Abdominal Pain    Victor Huang is a 47 y.o. male.  HPI     This is a 47 year old male who presents with abdominal pain.  History of spinal cord injury and partial quadriplegia.  He has some autonomic dysreflexia as well as ongoing chronic pain.  Reports having acute onset of right lower quadrant pain earlier today.  It doubled him over.  He has some neurogenic bladder and bowel issues he takes Linzess and MiraLAX to have consistent bowel movements.  He was able to have a bowel movement and had some relief of pain but pain returned.  Has not noted any urinary symptoms or fevers.  Has never had pain like this but does state he had diverticulitis over 20 years ago and that maybe it was similar.  No known history of kidney stones.  Home Medications Prior to Admission medications   Medication Sig Start Date End Date Taking? Authorizing Provider  amitriptyline (ELAVIL) 75 MG tablet Take 1 tablet (75 mg total) by mouth at bedtime. 06/26/21   Jessy Oto, MD  cetirizine (ZYRTEC) 10 MG tablet Take 10 mg by mouth at bedtime. 12/26/22   [provider]  [facility] GENTLE LAXATIVE 5 MG EC tablet TAKE 1 TABLET (5 MG TOTAL) BY MOUTH DAILY AS NEEDED FOR MODERATE CONSTIPATION. 07/16/22   Jessy Oto, MD  dantrolene (DANTRIUM) 50 MG capsule Take 1 capsule (50 mg total) by mouth at bedtime. 1 x week, then 2x/day x 1 week, then 3x/day- for spasticity along with Zanaflex- has failed baclofen 01/01/23   Lovorn, Jinny Blossom, MD  DULoxetine (CYMBALTA) 60 MG capsule BEGIN TAKING 1 CAPSULE AT BEDTIME (AFTER RAMP UP DOSING WITH 20 MG CAPSULES COMPLETED) 11/12/22   Lovorn, Jinny Blossom, MD  esomeprazole (NEXIUM) 40 MG capsule Take 40 mg by mouth 2 (two) times daily. 12/12/22   [provider]  famotidine (PEPCID) 20 MG tablet TAKE 1  TABLET BY MOUTH TWICE A DAY 12/26/22   Sharyn Creamer, MD  LINZESS 290 MCG CAPS capsule Take 1 capsule (290 mcg total) by mouth daily. 01/20/23   Lovorn, Jinny Blossom, MD  metoprolol tartrate (LOPRESSOR) 25 MG tablet TAKE 0.5 TABLETS (12.5 MG TOTAL) BY MOUTH 2 (TWO) TIMES DAILY AS NEEDED (FOR FAST HEART RATE-). 03/25/22   Lovorn, Jinny Blossom, MD  morphine (MS CONTIN) 15 MG 12 hr tablet Take 1 tablet (15 mg total) by mouth every 12 (twelve) hours. 01/20/23   Lovorn, Jinny Blossom, MD  naloxegol oxalate (MOVANTIK) 25 MG TABS tablet Take 1 tablet (25 mg total) by mouth daily. 05/08/22   Sharyn Creamer, MD  nitroGLYCERIN (NITROSTAT) 0.4 MG SL tablet Place 1 tablet (0.4 mg total) under the tongue every 15 (fifteen) minutes as needed (for autonomic dysreflexia- max 3 tab/day). 03/28/22   Lovorn, Jinny Blossom, MD  omeprazole (PRILOSEC) 40 MG capsule Take 1 capsule (40 mg total) by mouth 2 (two) times daily. Take for 8 weeks twice daily, then drop down to daily 04/08/22   Sharyn Creamer, MD  ondansetron (ZOFRAN) 4 MG tablet Take 1 tablet (4 mg total) by mouth 3 (three) times daily as needed for nausea or vomiting. 07/08/22   Sharyn Creamer, MD  ondansetron (ZOFRAN-ODT) 4 MG disintegrating tablet Take 1 tablet (4 mg total) by mouth every 8 (eight) hours  as needed for nausea or vomiting. 10/18/22   Lovorn, Jinny Blossom, MD  oseltamivir (TAMIFLU) 75 MG capsule Take 75 mg by mouth 2 (two) times daily. 01/04/23   [provider]  Oxycodone HCl 10 MG TABS Take 1 tablet (10 mg total) by mouth every 4 (four) hours as needed (max 5 tabs/day). 01/20/23 01/20/24  Lovorn, Jinny Blossom, MD  polyethylene glycol (MIRALAX) 17 g packet Take 17 g by mouth daily. 12/05/21   Lovorn, Jinny Blossom, MD  pregabalin (LYRICA) 150 MG capsule Take 1 capsule (150 mg total) by mouth 4 (four) times daily. 01/20/23   Lovorn, Jinny Blossom, MD  sildenafil (VIAGRA) 100 MG tablet Take 1 tablet (100 mg total) by mouth daily as needed for erectile dysfunction. 01/21/23   Lovorn, Jinny Blossom, MD  tiZANidine  (ZANAFLEX) 4 MG tablet TAKE 2 TABLETS (8 MG TOTAL) BY MOUTH EVERY 6 (SIX) HOURS. 11/25/22   Lovorn, Jinny Blossom, MD      Allergies    Baclofen and Ibuprofen    Review of Systems   Review of Systems  Constitutional:  Negative for fever.  Respiratory:  Negative for shortness of breath.   Cardiovascular:  Negative for chest pain.  Gastrointestinal:  Positive for abdominal pain. Negative for constipation, nausea and vomiting.  All other systems reviewed and are negative.   Physical Exam Updated Vital Signs BP 112/86   Pulse 80   Temp 98.7 F (37.1 C) (Oral)   Resp 20   Ht 1.765 m (5' 9.5")   Wt 92.9 kg   SpO2 98%   BMI 29.81 kg/m  Physical Exam Vitals and nursing note reviewed.  Constitutional:      Appearance: He is well-developed. He is obese. He is not ill-appearing.  HENT:     Head: Normocephalic and atraumatic.  Eyes:     Pupils: Pupils are equal, round, and reactive to light.  Cardiovascular:     Rate and Rhythm: Normal rate and regular rhythm.     Heart sounds: Normal heart sounds. No murmur heard. Pulmonary:     Effort: Pulmonary effort is normal. No respiratory distress.     Breath sounds: Normal breath sounds. No wheezing.  Abdominal:     General: Bowel sounds are normal.     Palpations: Abdomen is soft.     Tenderness: There is abdominal tenderness in the right lower quadrant. There is no guarding or rebound.  Musculoskeletal:     Cervical back: Neck supple.  Lymphadenopathy:     Cervical: No cervical adenopathy.  Skin:    General: Skin is warm and dry.  Neurological:     Mental Status: He is alert and oriented to person, place, and time.  Psychiatric:        Mood and Affect: Mood normal.     ED Results / Procedures / Treatments   Labs (all labs ordered are listed, but only abnormal results are displayed) Labs Reviewed  COMPREHENSIVE METABOLIC PANEL - Abnormal; Notable for the following components:      Result Value   Glucose, Bld 101 (*)    All other  components within normal limits  CBC WITH DIFFERENTIAL/PLATELET - Abnormal; Notable for the following components:   WBC 17.2 (*)    Neutro Abs 8.7 (*)    Lymphs Abs 6.2 (*)    Monocytes Absolute 1.3 (*)    Eosinophils Absolute 0.8 (*)    Basophils Absolute 0.2 (*)    All other components within normal limits  LIPASE, BLOOD  URINALYSIS, ROUTINE W REFLEX MICROSCOPIC  EKG None  Radiology CT ABDOMEN PELVIS W CONTRAST  Result Date: 02/06/2023 CLINICAL DATA:  Right lower quadrant abdominal pain EXAM: CT ABDOMEN AND PELVIS WITH CONTRAST TECHNIQUE: Multidetector CT imaging of the abdomen and pelvis was performed using the standard protocol following bolus administration of intravenous contrast. RADIATION DOSE REDUCTION: This exam was performed according to the departmental dose-optimization program which includes automated exposure control, adjustment of the mA and/or kV according to patient size and/or use of iterative reconstruction technique. CONTRAST:  78mL OMNIPAQUE IOHEXOL 350 MG/ML SOLN COMPARISON:  CT dated 03/25/2022. FINDINGS: Lower chest: The visualized lung bases are clear. No intra-abdominal free air or free fluid. Hepatobiliary: No focal liver abnormality is seen. No gallstones, gallbladder wall thickening, or biliary dilatation. Pancreas: Unremarkable. No pancreatic ductal dilatation or surrounding inflammatory changes. Spleen: Normal in size without focal abnormality. Adrenals/Urinary Tract: The adrenal glands are unremarkable. There is a 5 mm linear nonobstructing left renal inferior pole calculus. No hydronephrosis. The right kidney is unremarkable. The visualized ureters and urinary bladder appear unremarkable. Stomach/Bowel: There is no bowel obstruction or active inflammation. The appendix is normal. Vascular/Lymphatic: The abdominal aorta and IVC are unremarkable. No portal venous gas. There is no adenopathy. Reproductive: The prostate and seminal vesicles are grossly unremarkable.  No pelvic mass. Other: None Musculoskeletal: No acute or significant osseous findings. IMPRESSION: 1. No acute intra-abdominal or pelvic pathology. Normal appendix. 2. A 5 mm linear nonobstructing left renal inferior pole calculus. No hydronephrosis. Electronically Signed   By: Anner Crete M.D.   On: 02/06/2023 00:35    Procedures Procedures    Medications Ordered in ED Medications  morphine (PF) 4 MG/ML injection 4 mg (4 mg Intravenous Given 02/06/23 0008)  ondansetron (ZOFRAN) injection 4 mg (4 mg Intravenous Given 02/06/23 0006)  LORazepam (ATIVAN) injection 1 mg (1 mg Intravenous Given 02/06/23 0015)  iohexol (OMNIPAQUE) 350 MG/ML injection 75 mL (75 mLs Intravenous Contrast Given 02/06/23 0025)    ED Course/ Medical Decision Making/ A&P                             Medical Decision Making Risk Prescription drug management.   This patient presents to the ED for concern of abdominal pain, this involves an extensive number of treatment options, and is a complaint that carries with it a high risk of complications and morbidity.  I considered the following differential and admission for this acute, potentially life threatening condition.  The differential diagnosis includes constipation, appendicitis, cholecystitis, colitis, urinary tract infection  MDM:    This is a 47 year old male with complicated neurologic history who presents with abdominal pain.  He is overall nontoxic.  Vital signs are reassuring.  He has some autonomic dysfunction with bowel and bladder dysfunction.  He takes a bowel regimen daily.  Acute onset of right lower quadrant pain.  Some improvement with bowel movement but it recurred.  He is overall nontoxic.  Some right lower quadrant tenderness to palpation.  Labs obtained.  Patient given fluids and pain medication.  Labs are largely reassuring.  No significant metabolic derangements.  No evidence of UTI.  He does have a leukocytosis but this appears to be somewhat chronic  in nature when compared to old CBCs.  CT scan obtained.  No evidence of acute appendicitis or other intra-abdominal pathology that explains his symptoms.  Patient resting comfortably.  He is able to tolerate p.o. challenge.  Question whether it may have been constipation  or bowel spasm.  Updated the patient and his wife.  Will discharge home.  (Labs, imaging, consults)  Labs: I Ordered, and personally interpreted labs.  The pertinent results include: CBC, CMP, lipase, urinalysis  Imaging Studies ordered: I ordered imaging studies including CT abdomen pelvis I independently visualized and interpreted imaging. I agree with the radiologist interpretation  Additional history obtained from chart review.  External records from outside source obtained and reviewed including neurology notes, orthopedic notes  Cardiac Monitoring: The patient was maintained on a cardiac monitor.  If on the cardiac monitor, I personally viewed and interpreted the cardiac monitored which showed an underlying rhythm of: Sinus rhythm  Reevaluation: After the interventions noted above, I reevaluated the patient and found that they have :improved  Social Determinants of Health:  lives with wife  Disposition: Discharge  Co morbidities that complicate the patient evaluation  Past Medical History:  Diagnosis Date   Acute incomplete quadriplegia (HCC) 11/02/2021   Anxiety disorder    Asthma    as a child   Autonomic dysreflexia    Cervical myelopathy (HCC) 06/22/2021   Chest pain of uncertain etiology 10/04/2021   Chronic pain syndrome 11/02/2021   Cigarette smoker 10/04/2021   Depression    Diverticulosis    Essential (primary) hypertension    GERD (gastroesophageal reflux disease)    Headache    Neurogenic bowel 02/04/2022   Other spondylosis with radiculopathy, cervical region 06/18/2021   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    Palpitations 10/04/2021   Pneumonia    Pseudarthrosis after fusion or arthrodesis  06/18/2021   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   Spasticity 11/02/2021   Status post cervical spinal fusion 06/18/2021     Medicines Meds ordered this encounter  Medications   morphine (PF) 4 MG/ML injection 4 mg   ondansetron (ZOFRAN) injection 4 mg   LORazepam (ATIVAN) injection 1 mg   iohexol (OMNIPAQUE) 350 MG/ML injection 75 mL    I have reviewed the patients home medicines and have made adjustments as needed  Problem List / ED Course: Problem List Items Addressed This Visit   None Visit Diagnoses     Right lower quadrant abdominal pain    -  Primary                   Final Clinical Impression(s) / ED Diagnoses Final diagnoses:  Right lower quadrant abdominal pain    Rx / DC Orders ED Discharge Orders     None         Shon Baton, MD 02/06/23 [phone]

## 2023-02-19 ENCOUNTER — Other Ambulatory Visit: Payer: Self-pay | Admitting: Physical Medicine and Rehabilitation

## 2023-02-19 ENCOUNTER — Encounter: Payer: Self-pay | Admitting: Physical Medicine and Rehabilitation

## 2023-02-19 NOTE — Telephone Encounter (Signed)
Can you refill in the absence of Dr. Dagoberto Ligas?  Last office visit 1/22/24Will try Viagra-  100 mg 30 minutes before intimacy- [facility] or [facility] if more than $20.    Don't try Nitroglycerin when would take Viagra- only has for AD- so needs at least 2-3 hours between Nitroglycerin and Viagra so doesn't lower BP too much.    3. Referred to Dr Johnney Ou since has reduced size of penis and erectile issues- try Viagra before sees Dr Tamala Julian     4. Change Dantrolene to 50 mg in AM and 100 mg nightly- don't increase dose.    5. Hand swelling is likely from Lyrica- however I don't see an easy way to get off Lyrica- - compression gloves at night can help- can have full glove or cut out for tips of fingers only- can wear at night and as needed. Shown what to get.    6. Con't Linzess and Lyrica- will refill meds and change Lyrica to 150 mg 4x/day- but 3 months supply with 1 refill.    7. Refill MS Contin 15 mg BID and Oxyocdone 10 mg q4 hours #150- due late January.    8. Con't Zanaflex 8 mg 4x/day; and Zofran as needed- doesn't need refills.    9. Con't Duloxetine- 60 mg daily- con't regimen.    10. F/U in 3 months- double visit-    11. UDS due to day- still has opiate contract.    12. Add CMP for starting Dantrolene- need to monitor LFTs and kidneys

## 2023-02-20 MED ORDER — OXYCODONE HCL 10 MG PO TABS: 10.0000 mg | ORAL_TABLET | ORAL | 0 refills | Status: DC | PRN

## 2023-02-20 MED ORDER — MORPHINE SULFATE ER 15 MG PO TBCR: 15.0000 mg | EXTENDED_RELEASE_TABLET | Freq: Two times a day (BID) | ORAL | 0 refills | Status: DC

## 2023-02-26 MED ORDER — OXYCODONE HCL 10 MG PO TABS: 10.0000 mg | ORAL_TABLET | ORAL | 0 refills | Status: DC | PRN

## 2023-02-26 MED ORDER — MORPHINE SULFATE ER 15 MG PO TBCR: EXTENDED_RELEASE_TABLET | ORAL | 0 refills | Status: DC

## 2023-03-31 ENCOUNTER — Encounter: Payer: Self-pay | Admitting: Physical Medicine and Rehabilitation

## 2023-04-09 ENCOUNTER — Encounter: Payer: Self-pay | Admitting: Internal Medicine

## 2023-04-10 ENCOUNTER — Telehealth: Payer: Medicaid Other | Admitting: Neurology

## 2023-04-14 ENCOUNTER — Other Ambulatory Visit (HOSPITAL_COMMUNITY): Payer: Self-pay

## 2023-04-14 ENCOUNTER — Other Ambulatory Visit: Payer: Self-pay | Admitting: Physical Medicine and Rehabilitation

## 2023-04-14 MED ORDER — OXYCODONE HCL 10 MG PO TABS
10.0000 mg | ORAL_TABLET | ORAL | 0 refills | Status: DC | PRN
  Filled 2023-04-14: qty 150, 30d supply, fill #0

## 2023-04-14 MED ORDER — MORPHINE SULFATE ER 15 MG PO TBCR
30.0000 mg | EXTENDED_RELEASE_TABLET | Freq: Every morning | ORAL | 0 refills | Status: DC
  Filled 2023-04-14: qty 90, 30d supply, fill #0

## 2023-04-18 ENCOUNTER — Telehealth: Payer: Self-pay | Admitting: *Deleted

## 2023-04-18 ENCOUNTER — Other Ambulatory Visit (HOSPITAL_COMMUNITY): Payer: Self-pay

## 2023-04-18 MED ORDER — OXYCODONE HCL 10 MG PO TABS: 10.0000 mg | ORAL_TABLET | ORAL | 0 refills | Status: DC | PRN

## 2023-04-18 MED ORDER — MORPHINE SULFATE ER 15 MG PO TBCR: 30.0000 mg | EXTENDED_RELEASE_TABLET | Freq: Every morning | ORAL | 0 refills | Status: DC

## 2023-04-18 NOTE — Telephone Encounter (Signed)
PMP was Reviewed.  Dr Berline Chough note was Reviewed.  Morphine and Oxycodone was e-scribed to pharmacy.  Call placed to Mr. Castille regarding the above, he verbalizes understanding.

## 2023-04-18 NOTE — Telephone Encounter (Signed)
Patient called because his Oxycodone/Morphine was sent to wrong pharmacy. I have called and cancelled both rx sent to [facility] [place].

## 2023-04-22 ENCOUNTER — Telehealth: Payer: Self-pay | Admitting: *Deleted

## 2023-04-22 NOTE — Telephone Encounter (Signed)
Urine drug screen for this encounter is consistent for prescribed medication 

## 2023-04-23 ENCOUNTER — Encounter: Payer: Self-pay | Admitting: Physical Medicine and Rehabilitation

## 2023-04-23 ENCOUNTER — Telehealth: Payer: Self-pay

## 2023-04-23 ENCOUNTER — Encounter
Payer: Medicaid Other | Attending: Physical Medicine and Rehabilitation | Admitting: Physical Medicine and Rehabilitation

## 2023-04-23 VITALS — BP 126/83 | HR 90 | Ht 69.5 in | Wt 208.0 lb

## 2023-04-23 DIAGNOSIS — G8250 Quadriplegia, unspecified: Secondary | ICD-10-CM | POA: Diagnosis not present

## 2023-04-23 DIAGNOSIS — G904 Autonomic dysreflexia: Secondary | ICD-10-CM | POA: Diagnosis not present

## 2023-04-23 DIAGNOSIS — R259 Unspecified abnormal involuntary movements: Secondary | ICD-10-CM | POA: Diagnosis present

## 2023-04-23 DIAGNOSIS — N319 Neuromuscular dysfunction of bladder, unspecified: Secondary | ICD-10-CM | POA: Diagnosis not present

## 2023-04-23 DIAGNOSIS — F411 Generalized anxiety disorder: Secondary | ICD-10-CM | POA: Diagnosis not present

## 2023-04-23 DIAGNOSIS — G894 Chronic pain syndrome: Secondary | ICD-10-CM | POA: Diagnosis present

## 2023-04-23 MED ORDER — DANTROLENE SODIUM 100 MG PO CAPS: 100.0000 mg | ORAL_CAPSULE | Freq: Two times a day (BID) | ORAL | 1 refills | Status: DC

## 2023-04-23 NOTE — Progress Notes (Signed)
Subjective:    Patient ID: Victor Huang, male    DOB: 01-18-1976, 47 y.o.   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  He now has chronic nerve and chronic pain since  surgery as well. 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  Is a C2 R hemi/SCI/quadriplegia- ASIA D. Now depressed with sleep issues as a result and neurogenic bowel/incontinence. 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  And fine motor skills- are very impaired.   Memory is a major issue- forgetting things-  Could be fibrofog?  Feels pressure building up when stands for any amount of time.   Shakes when "getting cold" when getting episode- limbs start shaking- and hands- and feels so cold- cannot stop shaking  Last ED visit- 2/24- due to kidney stone? Had severe belly pain- .  But only see kidney stone in kidney that's not obstructing.  Was difficult to get BP under control- didn't stop until got Ativan for CT scan- gave him morphine- 4tx the "amount he takes".  Pseudoseizure- sounds like it.   Had appointment with Dr Katrinka Blazing by phone- telehealth- ordered UDS- urodynamics study- scheduled for 5/24.   Still taking Dantrolene- over the past couple of weeks- jerking getting a little worse in past few weeks. - still taking 50 mg TID.    Random times, will "space out"-  involuntarily saying something- and doesn't know what he said- Starts talking but doesn't think said them- and will do action with hand what was thinking, but involuntary. Wasn't planning to do so.   Bowels going OK- with Linzess- having a lot of rectal irritation with dig stim and suppository- hurts.     Still taking Duloxetine- 60 mg QHS Linzess- 290-mg- qday Actually takes Lyrica 150  mg BID and TID on bad days- not 4x/day like prescribed.  (Made himtoo sleepy) Zanaflex- 8 mg 4x/day- Viagra as needed  Being put on BiPAP machine at night- sleeps in recliner x 2 years.  Involuntary movements at night- where arms creep up, and entangle in hair, etc.     Pain Inventory Average Pain 5 Pain Right Now 5 My pain is constant, sharp, burning, and tingling  LOCATION OF PAIN  all over  BOWEL Number of stools per week:  3x/day Oral laxative use Yes  Type of laxative linzess Enema or suppository use Yes  History of colostomy No  Incontinent No   BLADDER Pads In and out cath, frequency doesn't cath Able to self cath No  Bladder incontinence Yes  Frequent urination Yes  Leakage with coughing Yes  Difficulty starting stream Yes  Incomplete bladder emptying Yes    Mobility walk without assistance  Function disabled: date disabled 2 years ago  Neuro/Psych bladder control problems bowel control problems weakness numbness tremor tingling spasms dizziness confusion depression anxiety  Prior Studies CT/MRI  Physicians involved in your care Primary care Dr Irving Burton   Family History  Problem Relation Age of Onset   Hypertension Mother    Diabetes Mother    Cervical cancer Mother    Heart attack Father    Asthma Maternal Grandmother    Food Allergy Son    Seizures Son    Allergies Son  Colitis Son    GER disease Son    Irritable bowel syndrome Son    Asthma Son    Food Allergy Son    Allergies Son    Other Son        eosinophilic esophagitis   Colitis Son    GER disease Son    Asthma Son    Colon cancer Neg Hx    Esophageal cancer Neg Hx    Stomach cancer Neg Hx    Pancreatic cancer Neg Hx    Colon polyps Neg Hx    Social History   Socioeconomic History   Marital status: Married    Spouse name: Not on file   Number of children: 2   Years of education: Not on file   Highest education level: Not on file  Occupational History    Not on file  Tobacco Use   Smoking status: Every Day    Packs/day: 1    Types: Cigarettes    Start date: 01/01/1987   Smokeless tobacco: Never  Vaping Use   Vaping Use: Never used  Substance and Sexual Activity   Alcohol use: Never    Comment: no beer in over 15 years   Drug use: Not Currently   Sexual activity: Yes  Other Topics Concern   Not on file  Social History Narrative   Not on file   Social Determinants of Health   Financial Resource Strain: Not on file  Food Insecurity: Not on file  Transportation Needs: Not on file  Physical Activity: Not on file  Stress: Not on file  Social Connections: Not on file   Past Surgical History:  Procedure Laterality Date   BIOPSY  04/08/2022   Procedure: BIOPSY;  Surgeon: Imogene Burn, MD;  Location: [facility];  Service: Gastroenterology;;   COLONOSCOPY WITH PROPOFOL N/A 04/08/2022   Procedure: COLONOSCOPY WITH PROPOFOL;  Surgeon: Imogene Burn, MD;  Location: [facility];  Service: Gastroenterology;  Laterality: N/A;   ESOPHAGOGASTRODUODENOSCOPY (EGD) WITH PROPOFOL N/A 04/08/2022   Procedure: ESOPHAGOGASTRODUODENOSCOPY (EGD) WITH PROPOFOL;  Surgeon: Imogene Burn, MD;  Location: [facility];  Service: Gastroenterology;  Laterality: N/A;   POLYPECTOMY  04/08/2022   Procedure: POLYPECTOMY;  Surgeon: Imogene Burn, MD;  Location: [facility];  Service: Gastroenterology;;   POSTERIOR CERVICAL FUSION/FORAMINOTOMY N/A 06/18/2021   Procedure: POSTERIOR CERVICAL FUSION CERVICAL FIVE THROUGH SIX WITH TRIPLE WIRE TECHNIQUE FIXATION AND RIGHT ILIAC CREST BONE GRAFT HARVEST, LEFT CERVIACL SIX THROUGH SEVEN CERVICAL FORAMINAL LAMINOPLASTY;  Surgeon: Kerrin Champagne, MD;  Location: [facility];  Service: Orthopedics;  Laterality: N/A;   Past Medical History:  Diagnosis Date   Acute incomplete quadriplegia 11/02/2021   Anxiety disorder    Asthma    as a child   Autonomic dysreflexia    Cervical myelopathy 06/22/2021   Chest pain of uncertain  etiology 10/04/2021   Chronic pain syndrome 11/02/2021   Cigarette smoker 10/04/2021   Depression    Diverticulosis    Essential (primary) hypertension    GERD (gastroesophageal reflux disease)    Headache    Neurogenic bowel 02/04/2022   Other spondylosis with radiculopathy, cervical region 06/18/2021   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    Palpitations 10/04/2021   Pneumonia    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/2021   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   Spasticity 11/02/2021   Status post cervical spinal fusion 06/18/2021   BP 126/83   Pulse 90   Ht 5' 9.5" (1.765 m)   [employer]  208 lb (94.3 kg)   SpO2 98%   BMI 30.28 kg/m   Opioid Risk Score:   Fall Risk Score:  `1  Depression screen [facility] 2/9     04/23/2023    9:24 AM 10/18/2022    9:44 AM 06/17/2022    9:14 AM 02/04/2022    9:05 AM 11/02/2021    9:39 AM  Depression screen PHQ 2/9  Decreased Interest 1 1 3  0 0  Down, Depressed, Hopeless 1 1 3  0 3  PHQ - 2 Score 2 2 6  0 3  Altered sleeping     3  Tired, decreased energy     0  Change in appetite     0  Feeling bad or failure about yourself      0  Trouble concentrating     0  Moving slowly or fidgety/restless     0  Suicidal thoughts     0  PHQ-9 Score     6    Review of Systems An entire ROS was completed and found to be negative except HPI    Objective:   Physical Exam Awake, alert, getting red in face; and looks SOB; sats 97% but arms and chest getting red, accompanied by wife, NAD Hoffman's (+) brisk B/L Clonus 4-5 beats in LE's.  MAS of 1-1+ in Ue's and LE's.   Sounds very nasal Looks slightly SOB- some accessory muscle use Mild swelling in hands and feet-      Assessment & Plan:   Pt is a 47 yr old male with hx of incomplete quadriplegia  (C2 R hemi/quadriplegia ASIA D)  and s/p C5/7 posterior cervical fusion by Dr Otelia Sergeant- 06/18/21.  He now has chronic nerve and chronic pain since  surgery as well. Just had stress test from [facility]- due to dysautonomia he's  describing.  Is a C2 R hemi/SCI/quadriplegia- ASIA D. Now depressed with sleep issues as a result and neurogenic bowel/incontinence.    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    3. Referral to Neurology- Corinda Gubler- didn't like doctor- Dr Epimenio Foot at [facility]- wasn't dismissed- asking to see neurology- involuntarily speaking per pt/wife- saying things that he doesn't remember saying 10 seconds before- just started a few months- doesn't realize he says them.   4. Hemorrhoids cream- use 2x/day x1 week then as needed. For rectal irritation.    5. Con't  taking Duloxetine- 60 mg QHS Linzess- 290-mg- qday Actually takes Lyrica 150 mg BID and TID on bad days- not 4x/day like prescribed.  (Made himtoo sleepy) Zanaflex- 8 mg 4x/day- Viagra as needed  6. Dry mouth- and nasal congestion suggest Flonase- is over the counter- Biotene gum- but any gum helps salivary production  7. Just refilled Pain meds- MS Contin and Oxycodone.  Continue meds   8. Increase Dantrolene- 100 mg 2x/day x 1 week, then 100 mg 3x/day- for spasticity- last CMP in [facility] 02/05/23  -sent in for 6 months  9. Urodynamics next month with Dr Katrinka Blazing  10. Swelling is from Lyrica-  can use compression socks- don't need stockings. Much easier to get on- usually 6 for $20  11. UDS last  visit- con't Morphine/MS Contin and Oxycodone.    12. Needs letter for disability by 5/1  13. F/U in 3 months double appt   I spent a total of  42  minutes on total care today- >50% coordination of care- due to discussion on spasticity, pain, AD, and swelling- cannot stop Lyrica since working.  Also d/w pt about disability.

## 2023-04-23 NOTE — Patient Instructions (Signed)
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  He now has chronic nerve and chronic pain since  surgery as well. 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  Is a C2 R hemi/SCI/quadriplegia- ASIA D. Now depressed with sleep issues as a result and neurogenic bowel/incontinence.    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    3. Referral to Neurology- Corinda Gubler- didn't like doctor- Dr Epimenio Foot at [facility]- wasn't dismissed- asking to see neurology- involuntarily speaking per pt/wife- saying things that he doesn't remember saying 10 seconds before- just started a few months- doesn't realize he says them.   4. Hemorrhoids cream- use 2x/day x1 week then as needed. For rectal irritation.    5. Con't  taking Duloxetine- 60 mg QHS Linzess- 290-mg- qday Actually takes Lyrica 150 mg BID and TID on bad days- not 4x/day like prescribed.  (Made himtoo sleepy) Zanaflex- 8 mg 4x/day- Viagra as needed  6. Dry mouth- and nasal congestion suggest Flonase- is over the counter- Biotene gum- but any gum helps salivary production  7. Just refilled Pain meds- MS Contin and Oxycodone.  Continue meds   8. Increase Dantrolene- 100 mg 2x/day x 1 week, then 100 mg 3x/day- for spasticity- last CMP in [facility] 02/05/23  -sent in for 6 months  9. Urodynamics next month with Dr Katrinka Blazing  10. Swelling is from Lyrica-  can use compression socks- don't need stockings. Much easier to get on- usually 6 for $20  11. UDS last visit- con't Morphine/MS  Contin and Oxycodone.    12. Needs letter for disability by 5/1  13. F/U in 3 months double appt

## 2023-04-23 NOTE — Telephone Encounter (Signed)
Dantrium 100 MG approved 04/23/2023 to 04/22/2024. Pharmacy informed.

## 2023-04-29 ENCOUNTER — Encounter: Payer: Self-pay | Admitting: Physical Medicine and Rehabilitation

## 2023-04-30 NOTE — Telephone Encounter (Signed)
I wrote out a letter for disability for pt- will leave at desk and have staff let him know it's ready.

## 2023-05-13 ENCOUNTER — Other Ambulatory Visit: Payer: Self-pay | Admitting: Physical Medicine and Rehabilitation

## 2023-05-13 MED ORDER — MORPHINE SULFATE ER 15 MG PO TBCR: 30.0000 mg | EXTENDED_RELEASE_TABLET | Freq: Every morning | ORAL | 0 refills | Status: DC

## 2023-05-13 MED ORDER — OXYCODONE HCL 10 MG PO TABS: 10.0000 mg | ORAL_TABLET | ORAL | 0 refills | Status: DC | PRN

## 2023-05-13 NOTE — Telephone Encounter (Signed)
From: [name] To: Office of Jacalyn Lefevre, NP Sent: 05/12/2023 4:03 PM EDT Subject: Medication Renewal Request  Refills have been requested for the following medications:   morphine (MS CONTIN) 15 MG 12 hr tablet [Victor Huang]   Oxycodone HCl 10 MG TABS [Victor Huang]  Preferred pharmacy: [id number] - [place], [place] - [address] Delivery method: [employer]

## 2023-05-18 ENCOUNTER — Other Ambulatory Visit: Payer: Self-pay | Admitting: Physical Medicine and Rehabilitation

## 2023-05-19 MED ORDER — MORPHINE SULFATE ER 15 MG PO TBCR: 30.0000 mg | EXTENDED_RELEASE_TABLET | Freq: Every morning | ORAL | 0 refills | Status: DC

## 2023-05-19 MED ORDER — OXYCODONE HCL 10 MG PO TABS: 10.0000 mg | ORAL_TABLET | ORAL | 0 refills | Status: DC | PRN

## 2023-05-19 NOTE — Telephone Encounter (Signed)
PMP was Reviewed.  Dr Berline Chough note was reviewed.  Call placed to Victor Huang, he verbalizes understanding.

## 2023-05-19 NOTE — Telephone Encounter (Signed)
  There has been an issue with patient's MCD. [facility] about scripts sent over the weekend by Dr. Berline Chough. The is a ticket in place to have Dr. Berline Chough to prescribing team of physician's ( [phone]). Please  re-send scripts. Previous scripts have been cancelled with the pharmacy.

## 2023-06-10 ENCOUNTER — Telehealth: Payer: Self-pay | Admitting: Registered Nurse

## 2023-06-10 MED ORDER — MORPHINE SULFATE ER 15 MG PO TBCR: 30.0000 mg | EXTENDED_RELEASE_TABLET | Freq: Every morning | ORAL | 0 refills | Status: DC

## 2023-06-10 MED ORDER — OXYCODONE HCL 10 MG PO TABS: 10.0000 mg | ORAL_TABLET | ORAL | 0 refills | Status: DC | PRN

## 2023-06-10 NOTE — Telephone Encounter (Signed)
PMP was Reviewed.  Morphine and Oxycodone last fill date was on 05/19/2023. Victor Huang was called regarding the above and he verbalizes understanding. Prescriptions was sent with a date to refill medication, he verbalizes understanding.

## 2023-06-16 ENCOUNTER — Other Ambulatory Visit: Payer: Self-pay | Admitting: Physical Medicine and Rehabilitation

## 2023-06-16 MED ORDER — MORPHINE SULFATE ER 15 MG PO TBCR: 30.0000 mg | EXTENDED_RELEASE_TABLET | Freq: Every morning | ORAL | 0 refills | Status: DC

## 2023-06-16 NOTE — Telephone Encounter (Signed)
From: [name] To: Office of Jacalyn Lefevre, [place] Sent: 06/16/2023 3:28 PM EDT Subject: Medication Renewal Request  Refills have been requested for the following medications:   morphine (MS CONTIN) 15 MG 12 hr tablet [Eunice Thomas]  Patient Comment: The pharmacy is requesting a pre authorization for this medicine.   Preferred pharmacy: [id number] - [place], [place] - [address] Delivery method: [employer]

## 2023-06-17 ENCOUNTER — Telehealth: Payer: Self-pay | Admitting: *Deleted

## 2023-06-17 ENCOUNTER — Telehealth: Payer: Self-pay | Admitting: Registered Nurse

## 2023-06-17 NOTE — Telephone Encounter (Signed)
Approved. This drug has been approved. Approved quantity: 90 tablets per 30 day(s). You may fill up to a 34 day supply at a retail pharmacy. You may fill up to a 90 day supply for maintenance drugs, please refer to the formulary for details. Please call the pharmacy to process your prescription claim. Authorization Expiration Date: 09/15/2023  Marcos Eke notified.

## 2023-06-17 NOTE — Telephone Encounter (Signed)
Prior Auth for Morphine Sulfate ER filed with [employer] via [employer].

## 2023-06-17 NOTE — Telephone Encounter (Signed)
Patient wife came to office asking for authorization on morphine. He is on his last day of the medication.

## 2023-07-08 ENCOUNTER — Other Ambulatory Visit: Payer: Self-pay | Admitting: Physical Medicine and Rehabilitation

## 2023-07-08 MED ORDER — MORPHINE SULFATE ER 15 MG PO TBCR: 30.0000 mg | EXTENDED_RELEASE_TABLET | Freq: Every morning | ORAL | 0 refills | Status: DC

## 2023-07-09 ENCOUNTER — Other Ambulatory Visit: Payer: Self-pay | Admitting: Physical Medicine and Rehabilitation

## 2023-07-09 MED ORDER — OXYCODONE HCL 10 MG PO TABS: 10.0000 mg | ORAL_TABLET | ORAL | 0 refills | Status: DC | PRN

## 2023-07-09 NOTE — Telephone Encounter (Signed)
From: [name] To: Office of Jacalyn Lefevre, NP Sent: 07/08/2023 10:50 AM EDT Subject: Medication Renewal Request  Refills have been requested for the following medications:   Oxycodone HCl 10 MG TABS [Eunice Thomas]  [id number] - [place], [place] - [address] Delivery method: Pickup   Medication renewals requested in this message routed separately:    morphine (MS CONTIN) 15 MG 12 hr tablet [Jacquese Hackman]

## 2023-07-14 ENCOUNTER — Telehealth: Payer: Self-pay | Admitting: *Deleted

## 2023-07-14 NOTE — Telephone Encounter (Signed)
  Nicolaus Brendle (KeyMliss Sax) [phone] oxyCODONE HCl 10MG  tablets Status: PA RequestCreated: July 15th, [phone]

## 2023-07-15 NOTE — Telephone Encounter (Signed)
Victor Huang (KeyMliss Sax) - [phone] oxyCODONE HCl 10MG  tablets Status: PA Response - Approved

## 2023-07-15 NOTE — Telephone Encounter (Signed)
Oxycodone approved 07/14/2023-01/10/2024.

## 2023-07-16 NOTE — Progress Notes (Deleted)
  Cardiology Office Note:  .   Date:  07/16/2023  ID:  Victor Huang, DOB [birth year], MRN [id number] PCP: Erskine Emery, NP  [facility] Providers Cardiologist:  None { Click to update primary MD,subspecialty MD or APP then REFRESH:1}   History of Present Illness: .   Victor Huang is a 47 y.o. male with a past medical history of HTN, GERD, incomplete quadraplegia, chronic pain syndrome, palpitations, tobacco abuse.   He establish care with Dr. Tomie China in October 2022 at the behest of his  PCP for evaluation of palpitations.  He did have some chest pain at this time that was atypical for angina.  A Lexiscan was arranged and completed on 10/24/21 which revealed no ischemia, low risk study.  He was started on metoprolol tartrate for his palpitations.  ROS: ***  Studies Reviewed: .        *** Risk Assessment/Calculations:   {Does this patient have ATRIAL FIBRILLATION?:[phone]} No BP recorded.  {Refresh Note OR Click here to enter BP  :1}***       Physical Exam:   VS:  There were no vitals taken for this visit.   Wt Readings from Last 3 Encounters:  04/23/23 208 lb (94.3 kg)  02/05/23 204 lb 12.9 oz (92.9 kg)  01/20/23 204 lb 12.8 oz (92.9 kg)    GEN: Well nourished, well developed in no acute distress NECK: No JVD; No carotid bruits CARDIAC: ***RRR, no murmurs, rubs, gallops RESPIRATORY:  Clear to auscultation without rales, wheezing or rhonchi  ABDOMEN: Soft, non-tender, non-distended EXTREMITIES:  No edema; No deformity   ASSESSMENT AND PLAN: .   ***    {Are you ordering a CV Procedure (e.g. stress test, cath, DCCV, TEE, etc)?   Press F2        :[id number]}  Dispo: ***  Signed, Flossie Dibble, NP

## 2023-07-17 ENCOUNTER — Ambulatory Visit: Payer: Medicaid Other | Attending: Cardiology | Admitting: Cardiology

## 2023-07-17 DIAGNOSIS — R002 Palpitations: Secondary | ICD-10-CM

## 2023-07-17 DIAGNOSIS — I10 Essential (primary) hypertension: Secondary | ICD-10-CM

## 2023-07-17 DIAGNOSIS — F17210 Nicotine dependence, cigarettes, uncomplicated: Secondary | ICD-10-CM

## 2023-07-23 ENCOUNTER — Encounter: Payer: Self-pay | Admitting: Physical Medicine and Rehabilitation

## 2023-07-23 ENCOUNTER — Encounter: Payer: Medicaid Other | Admitting: Physical Medicine and Rehabilitation

## 2023-08-05 ENCOUNTER — Encounter: Payer: Medicaid Other | Attending: Registered Nurse | Admitting: Registered Nurse

## 2023-08-05 ENCOUNTER — Encounter: Payer: Self-pay | Admitting: Registered Nurse

## 2023-08-05 VITALS — BP 121/86 | HR 79 | Ht 69.5 in | Wt 206.4 lb

## 2023-08-05 DIAGNOSIS — M5412 Radiculopathy, cervical region: Secondary | ICD-10-CM | POA: Insufficient documentation

## 2023-08-05 DIAGNOSIS — M542 Cervicalgia: Secondary | ICD-10-CM | POA: Insufficient documentation

## 2023-08-05 DIAGNOSIS — Z79891 Long term (current) use of opiate analgesic: Secondary | ICD-10-CM | POA: Insufficient documentation

## 2023-08-05 DIAGNOSIS — G904 Autonomic dysreflexia: Secondary | ICD-10-CM | POA: Diagnosis present

## 2023-08-05 DIAGNOSIS — G894 Chronic pain syndrome: Secondary | ICD-10-CM | POA: Insufficient documentation

## 2023-08-05 DIAGNOSIS — Z5181 Encounter for therapeutic drug level monitoring: Secondary | ICD-10-CM | POA: Insufficient documentation

## 2023-08-05 DIAGNOSIS — G8250 Quadriplegia, unspecified: Secondary | ICD-10-CM | POA: Diagnosis present

## 2023-08-05 MED ORDER — OXYCODONE HCL 10 MG PO TABS: 10.0000 mg | ORAL_TABLET | ORAL | 0 refills | Status: DC | PRN

## 2023-08-05 MED ORDER — MORPHINE SULFATE ER 15 MG PO TBCR: 30.0000 mg | EXTENDED_RELEASE_TABLET | Freq: Every morning | ORAL | 0 refills | Status: DC

## 2023-08-05 NOTE — Progress Notes (Signed)
Subjective:    Patient ID: Victor Huang, male    DOB: [birth year], 47 y.o.   MRN: [id number]  HPI: Victor Huang is a 47 y.o. male who returns for follow up appointment for chronic pain and medication refill. He states his  pain is located in his neck radiating into his bilateral shoulders and lower back pain. He also reports generalized pain . He rates his pain 5. His current exercise regime is walking he was encouraged to increase his HEP as tolerated, he verbalizes understanding.   [name] Morphine equivalent is 120.00 MME.   UDS ordered today.      Pain Inventory Average Pain 5 Pain Right Now 5 My pain is sharp, burning, dull, stabbing, tingling, and aching  In the last 24 hours, has pain interfered with the following? General activity 10 Relation with others 10 Enjoyment of life 10 What TIME of day is your pain at its worst? morning , daytime, evening, and night Sleep (in general) Fair  Pain is worse with: bending, sitting, and standing Pain improves with: rest and medication Relief from Meds:  not specified  Family History  Problem Relation Age of Onset   Hypertension Mother    Diabetes Mother    Cervical cancer Mother    Heart attack Father    Asthma Maternal Grandmother    Food Allergy Son    Seizures Son    Allergies Son    Colitis Son    GER disease Son    Irritable bowel syndrome Son    Asthma Son    Food Allergy Son    Allergies Son    Other Son        eosinophilic esophagitis   Colitis Son    GER disease Son    Asthma Son    Colon cancer Neg Hx    Esophageal cancer Neg Hx    Stomach cancer Neg Hx    Pancreatic cancer Neg Hx    Colon polyps Neg Hx    Social History   Socioeconomic History   Marital status: Married    Spouse name: Not on file   Number of children: 2   Years of education: Not on file   Highest education level: Not on file  Occupational History   Not on file  Tobacco Use   Smoking status: Every Day    Current  packs/day: 1.00    Average packs/day: 1 pack/day for 36.6 years (36.6 ttl pk-yrs)    Types: Cigarettes    Start date: 01/01/1987   Smokeless tobacco: Never  Vaping Use   Vaping status: Never Used  Substance and Sexual Activity   Alcohol use: Never    Comment: no beer in over 15 years   Drug use: Not Currently   Sexual activity: Yes  Other Topics Concern   Not on file  Social History Narrative   Not on file   Social Determinants of Health   Financial Resource Strain: Not on file  Food Insecurity: Not on file  Transportation Needs: Not on file  Physical Activity: Not on file  Stress: Not on file  Social Connections: Unknown (01/24/2023)   [facility]   Social Network    Social Network: Not on file   Past Surgical History:  Procedure Laterality Date   BIOPSY  04/08/2022   Procedure: BIOPSY;  Surgeon: Imogene Burn, MD;  Location: [facility];  Service: Gastroenterology;;   COLONOSCOPY WITH PROPOFOL N/A 04/08/2022  Procedure: COLONOSCOPY WITH PROPOFOL;  Surgeon: Imogene Burn, MD;  Location: [facility];  Service: Gastroenterology;  Laterality: N/A;   ESOPHAGOGASTRODUODENOSCOPY (EGD) WITH PROPOFOL N/A 04/08/2022   Procedure: ESOPHAGOGASTRODUODENOSCOPY (EGD) WITH PROPOFOL;  Surgeon: Imogene Burn, MD;  Location: [facility];  Service: Gastroenterology;  Laterality: N/A;   POLYPECTOMY  04/08/2022   Procedure: POLYPECTOMY;  Surgeon: Imogene Burn, MD;  Location: [facility];  Service: Gastroenterology;;   POSTERIOR CERVICAL FUSION/FORAMINOTOMY N/A 06/18/2021   Procedure: POSTERIOR CERVICAL FUSION CERVICAL FIVE THROUGH SIX WITH TRIPLE WIRE TECHNIQUE FIXATION AND RIGHT ILIAC CREST BONE GRAFT HARVEST, LEFT CERVIACL SIX THROUGH SEVEN CERVICAL FORAMINAL LAMINOPLASTY;  Surgeon: Kerrin Champagne, MD;  Location: [facility];  Service: Orthopedics;  Laterality: N/A;   Past Surgical History:  Procedure Laterality Date   BIOPSY  04/08/2022   Procedure: BIOPSY;   Surgeon: Imogene Burn, MD;  Location: [facility];  Service: Gastroenterology;;   COLONOSCOPY WITH PROPOFOL N/A 04/08/2022   Procedure: COLONOSCOPY WITH PROPOFOL;  Surgeon: Imogene Burn, MD;  Location: [facility];  Service: Gastroenterology;  Laterality: N/A;   ESOPHAGOGASTRODUODENOSCOPY (EGD) WITH PROPOFOL N/A 04/08/2022   Procedure: ESOPHAGOGASTRODUODENOSCOPY (EGD) WITH PROPOFOL;  Surgeon: Imogene Burn, MD;  Location: [facility];  Service: Gastroenterology;  Laterality: N/A;   POLYPECTOMY  04/08/2022   Procedure: POLYPECTOMY;  Surgeon: Imogene Burn, MD;  Location: [facility];  Service: Gastroenterology;;   POSTERIOR CERVICAL FUSION/FORAMINOTOMY N/A 06/18/2021   Procedure: POSTERIOR CERVICAL FUSION CERVICAL FIVE THROUGH SIX WITH TRIPLE WIRE TECHNIQUE FIXATION AND RIGHT ILIAC CREST BONE GRAFT HARVEST, LEFT CERVIACL SIX THROUGH SEVEN CERVICAL FORAMINAL LAMINOPLASTY;  Surgeon: Kerrin Champagne, MD;  Location: [facility];  Service: Orthopedics;  Laterality: N/A;   Past Medical History:  Diagnosis Date   Acute incomplete quadriplegia (HCC) 11/02/2021   Anxiety disorder    Asthma    as a child   Autonomic dysreflexia    Cervical myelopathy (HCC) 06/22/2021   Chest pain of uncertain etiology 10/04/2021   Chronic pain syndrome 11/02/2021   Cigarette smoker 10/04/2021   Depression    Diverticulosis    Essential (primary) hypertension    GERD (gastroesophageal reflux disease)    Headache    Neurogenic bowel 02/04/2022   Other spondylosis with radiculopathy, cervical region 06/18/2021   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    Palpitations 10/04/2021   Pneumonia    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/2021   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   Spasticity 11/02/2021   Status post cervical spinal fusion 06/18/2021   BP 121/86   Pulse 79   Ht 5' 9.5" (1.765 m)   Wt 206 lb 6.4 oz (93.6 kg)   SpO2 95%   BMI 30.04 kg/m   Opioid Risk Score:   Fall Risk Score:  `1  Depression screen  [facility] 2/9      08/05/2023    9:11 AM 04/23/2023    9:24 AM 10/18/2022    9:44 AM 06/17/2022    9:14 AM 02/04/2022    9:05 AM 11/02/2021    9:39 AM  Depression screen PHQ 2/9  Decreased Interest 0 1 1 3  0 0  Down, Depressed, Hopeless 0 1 1 3  0 3  PHQ - 2 Score 0 2 2 6  0 3  Altered sleeping      3  Tired, decreased energy      0  Change in appetite      0  Feeling bad or failure about yourself       0  Trouble concentrating      0  Moving slowly or fidgety/restless      0  Suicidal thoughts      0  PHQ-9 Score      6    Review of Systems  Constitutional: Negative.   HENT: Negative.    Eyes: Negative.   Respiratory: Negative.    Cardiovascular: Negative.   Gastrointestinal: Negative.   Endocrine: Negative.   Genitourinary: Negative.   Musculoskeletal:  Positive for myalgias and neck pain.  Skin: Negative.   Allergic/Immunologic: Negative.   Neurological:  Positive for headaches.  Hematological: Negative.   Psychiatric/Behavioral: Negative.    All other systems reviewed and are negative.      Objective:   Physical Exam Vitals and nursing note reviewed.  Constitutional:      Appearance: Normal appearance.  Cardiovascular:     Rate and Rhythm: Normal rate and regular rhythm.     Pulses: Normal pulses.     Heart sounds: Normal heart sounds.  Pulmonary:     Effort: Pulmonary effort is normal.     Breath sounds: Normal breath sounds.  Musculoskeletal:     Cervical back: Normal range of motion and neck supple.     Comments: Normal Muscle Bulk and Muscle Testing Reveals:  Upper Extremities: Decreased ROM  90 Degrees and Muscle Strength 5/5 Bilateral AC Joint Tenderness Thoracic and  Lumbar Numbness reported with Palpation he states Lower Extremities: Full ROM and Muscle Strength 5/5 Arises from Chair slowly Narrow Based  Gait      Skin:    General: Skin is warm and dry.  Neurological:     Mental Status: He is alert and oriented to person, place, and time.  Psychiatric:        Mood  and Affect: Mood normal.        Behavior: Behavior normal.         Assessment & Plan:  Acute Incomplete Quadriplegia: Continue with HEP as tolerated. Continue to Monitor.  Autonomic Dysreflexia: Cannot Stand for more than 10- 20 minutes Continue current medication regimen. Continue to monitor.  Cervicalgia/ Cervical Radiculitis: Continue Pregabalin. Continue to Monitor.  Chronic Pain Syndrome: Refilled: MS Contin ER 15 mg take 2  tablets in the morning and one tablet in the evening #90 and Oxycodone 10 mg one tablet every 4 hours as needed for pain #150. Second script sent for the following moth. We will continue the opioid monitoring program, this consists of regular clinic visits, examinations, urine drug screen, pill counts as well as use of [place] Controlled Substance Reporting system. A 12 month History has been reviewed on the [place] Controlled Substance Reporting System on 08/05/2023 F/U with Dr Berline Chough in 3 months.

## 2023-08-08 ENCOUNTER — Other Ambulatory Visit: Payer: Self-pay | Admitting: Physical Medicine and Rehabilitation

## 2023-08-31 ENCOUNTER — Other Ambulatory Visit: Payer: Self-pay | Admitting: Physical Medicine and Rehabilitation

## 2023-08-31 ENCOUNTER — Other Ambulatory Visit: Payer: Self-pay | Admitting: Internal Medicine

## 2023-09-05 ENCOUNTER — Telehealth: Payer: Self-pay | Admitting: *Deleted

## 2023-09-05 MED ORDER — MORPHINE SULFATE ER 15 MG PO TBCR: 30.0000 mg | EXTENDED_RELEASE_TABLET | Freq: Every morning | ORAL | 0 refills | Status: DC

## 2023-09-05 NOTE — Telephone Encounter (Signed)
[facility] can be filled on  9/11 which is in line with next fill date.Requesting that you send another script with correct date.

## 2023-09-05 NOTE — Telephone Encounter (Signed)
[facility]. Pt will run out on 09/12/23.

## 2023-09-05 NOTE — Telephone Encounter (Signed)
PMP was Reviewed.  [facility] prescription. Of Morphine. MS Contin E-scribed to pharmacy, Call placed to Mr. Salber regarding the above, he verbalizes understanding.

## 2023-09-17 ENCOUNTER — Ambulatory Visit: Payer: Medicaid Other | Admitting: Physical Medicine and Rehabilitation

## 2023-10-01 ENCOUNTER — Telehealth: Payer: Self-pay | Admitting: Registered Nurse

## 2023-10-01 MED ORDER — OXYCODONE HCL 10 MG PO TABS: 10.0000 mg | ORAL_TABLET | ORAL | 0 refills | Status: DC | PRN

## 2023-10-01 MED ORDER — MORPHINE SULFATE ER 15 MG PO TBCR: 30.0000 mg | EXTENDED_RELEASE_TABLET | Freq: Every morning | ORAL | 0 refills | Status: DC

## 2023-10-01 NOTE — Telephone Encounter (Signed)
PMP was Reviewed.  MS Contin and Oxycodone e-scribed to pharmacy . Mr. Victor Huang is aware via My-Chart

## 2023-10-08 ENCOUNTER — Telehealth: Payer: Self-pay

## 2023-10-08 ENCOUNTER — Telehealth: Payer: Self-pay | Admitting: Specialist

## 2023-10-08 NOTE — Telephone Encounter (Signed)
Patient of Dr. Berline Chough, pleas call in prior auth for morphine refill to pharmacy[facility]  [facility] [address] Shirlean Mylar, MHA, OT/L [phone]

## 2023-10-08 NOTE — Telephone Encounter (Signed)
Approved. This drug has been approved. Approved quantity: 90 tablets per 30 day(s). You may fill up to a 34 day supply at a retail pharmacy. You may fill up to a 90 day supply for maintenance drugs, please refer to the formulary for details. Authorization Expiration Date: 01/06/2024

## 2023-10-08 NOTE — Telephone Encounter (Signed)
PA for Morphine Sulfate submitted.

## 2023-10-08 NOTE — Telephone Encounter (Signed)
submitted

## 2023-10-31 ENCOUNTER — Telehealth: Payer: Self-pay | Admitting: Registered Nurse

## 2023-10-31 ENCOUNTER — Other Ambulatory Visit: Payer: Self-pay | Admitting: Physical Medicine and Rehabilitation

## 2023-10-31 MED ORDER — DULOXETINE HCL 60 MG PO CPEP: 60.0000 mg | ORAL_CAPSULE | Freq: Every day | ORAL | 2 refills | Status: DC

## 2023-10-31 MED ORDER — MORPHINE SULFATE ER 15 MG PO TBCR: 30.0000 mg | EXTENDED_RELEASE_TABLET | Freq: Every morning | ORAL | 0 refills | Status: DC

## 2023-10-31 MED ORDER — OXYCODONE HCL 10 MG PO TABS: 10.0000 mg | ORAL_TABLET | ORAL | 0 refills | Status: DC | PRN

## 2023-10-31 NOTE — Telephone Encounter (Signed)
PMP was Reviewed.  Morphine and Oxycodone e-scribed to pharmacy, not due today. He has a scheduled appointment with Dr Berline Chough on 11/10/2023

## 2023-11-10 ENCOUNTER — Encounter: Payer: Self-pay | Admitting: Physical Medicine and Rehabilitation

## 2023-11-10 ENCOUNTER — Encounter: Payer: Medicaid Other | Attending: Registered Nurse | Admitting: Physical Medicine and Rehabilitation

## 2023-11-10 VITALS — BP 135/86 | HR 101 | Ht 69.5 in | Wt 204.0 lb

## 2023-11-10 DIAGNOSIS — Z5181 Encounter for therapeutic drug level monitoring: Secondary | ICD-10-CM

## 2023-11-10 DIAGNOSIS — G904 Autonomic dysreflexia: Secondary | ICD-10-CM

## 2023-11-10 DIAGNOSIS — R252 Cramp and spasm: Secondary | ICD-10-CM

## 2023-11-10 DIAGNOSIS — G8250 Quadriplegia, unspecified: Secondary | ICD-10-CM | POA: Diagnosis present

## 2023-11-10 DIAGNOSIS — G894 Chronic pain syndrome: Secondary | ICD-10-CM | POA: Diagnosis present

## 2023-11-10 DIAGNOSIS — K592 Neurogenic bowel, not elsewhere classified: Secondary | ICD-10-CM

## 2023-11-10 DIAGNOSIS — Z79891 Long term (current) use of opiate analgesic: Secondary | ICD-10-CM

## 2023-11-10 MED ORDER — LINZESS 290 MCG PO CAPS: 290.0000 ug | ORAL_CAPSULE | Freq: Every day | ORAL | 1 refills | Status: DC

## 2023-11-10 MED ORDER — OXYCODONE HCL 10 MG PO TABS: 10.0000 mg | ORAL_TABLET | ORAL | 0 refills | Status: DC | PRN

## 2023-11-10 MED ORDER — DANTROLENE SODIUM 100 MG PO CAPS: 100.0000 mg | ORAL_CAPSULE | Freq: Two times a day (BID) | ORAL | 1 refills | Status: DC

## 2023-11-10 MED ORDER — MORPHINE SULFATE ER 15 MG PO TBCR: 30.0000 mg | EXTENDED_RELEASE_TABLET | Freq: Every morning | ORAL | 0 refills | Status: DC

## 2023-11-10 NOTE — Patient Instructions (Signed)
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  He now has chronic nerve and chronic pain since  surgery as well. 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  Is a C2 R hemi/SCI/quadriplegia- ASIA D. Now depressed with sleep issues as a result and neurogenic bowel/incontinence. 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   2.  Will see Riley Lam q2 months and then see me q4 months.    3. Needs to call sleep medicine/Pulmonary  doctors- and let them know what's going on. Got big bill since not using.   4. Try using rescue  inhaler before you go to bed- because NOT using Bipap- can increase risk of HTN, Coronary artery disease,  stroke, etc if you don't use.  Call them to see if can change pressure.    5. Vertigo eval- will wait for 2 weeks, but if not better, will send to Neurorehab for Vertigo eval. Likely- pt is nasally- and sinuses full, but could be due to AD episode.    6. Will con't MS Contin and Oxycodone 15 mg BID and 10 mg  #150 monthly- last filled 11/7- will send in  [facility] month [facility] with me and Riley Lam.   7. Doesn't need refills for other meds- con't Duloxetine filled 11/1-Zanaflex/Tizandine- filled 07/08/23; Lyrica filled 08/08/23;    8. Refilled of Linzess 290 mcg daily- #90 1 refill  9. Refilled Dantrolene 100 mg TID- for spasticity with zanaflex   10. Doesn't need more NTG or Zofran right now.  Hasn't bene using NTG when this occurs- can try it if BP is high and having AD Symptoms- can be used to break the cycle.    11.  Still smoking- 1/4 to 1/2 ppd- work on getting this down- can reduce blood flow to extremities for 4-6 hours by 50%. Mentioning could be contributing to AD.    12. F/U in 2 months with Riley Lam and 4 months  with me- single appt with Riley Lam and double appt with me.

## 2023-11-10 NOTE — Progress Notes (Signed)
Subjective:    Patient ID: Victor Huang, male    DOB: 1976-08-06, 47 y.o.   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  He now has chronic nerve and chronic pain since  surgery as well. 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  Is a C2 R hemi/SCI/quadriplegia- ASIA D. Now depressed with sleep issues as a result and neurogenic bowel/incontinence. Also has fibromyalgia-    Just dx'd with severe COPD and probable pulmonary fibrosis   Not feeling well- just had an episode of AD?- meds just checked in- a second ago- but doing better now- meds finally kicking in.  Sternum/chest was bothering him but doing better.   L LE is numb- and is "spreading"- and in abdomen and back-   abdomen is 'if doesn't take miralax 3x/day- and needs stools needs to keep super watery/runny- "stomach will puff up"- esp if don't keep his stools super loose, 1 day, cannot go/gets abdominal firmness, and filled up with gas.   Wakes up coughing- when stomach gets hard- coughing- and feels like stomach "pushing into lungs". Trouble passing gas-  and more gas overall.  were regular and then saw change- now playing catchup.  A lot more gas issues.   Had colonoscopy 1 year ago- looked ok- per wife.  Endoscopy at same time- found ulcers- and treated.     Choking- is GERD.   Supposed to be using breathing machine/BiPAP  at night.  Makes his stomach hard- and hurts- and then has "episode".  Can use sometimes- and sometimes doesn't feel llike getting enough air.   Takes Miralax 3x/day and Linzess 290 mcg daily- doing suppository- not nightly- but frequently- uses if no BM by 2pm, then uses.    Dantrolene still taking 50 mg TID Usually spasticity under control. But if has "AD" Sx's- will then have worse spasms-   If has phone in hands,  hands "jumping". Constantly "jumping"- occurs more when grabs something and puts pressure to hold it or typing something.  Still also on Zanaflex 8 mg 4x/day.   Lyrica- 150 mg BID and if gets bad, gets a third dose.  Makes him sleepy.  Still on Duloxetine 60 mg at bedtime.    Saw urology- Dr Katrinka Blazing-  doesn't remember seeing him, but per wife, loved him.   On 1 Bladder med/day-  Flomax- 0.4 mg daily.  Sees [facility]- saw in April-  no specific issues that she was aware of.    For 2 weeks- was dizzy- last 2 weeks- sitting up and down and standing- dizzy really bad- all the time, but better when sitting/laying down- last 24 hours, is better, but everything moves- world doesn't spin- feels like he's moving.    Has 2 puppies- keep him busy- doesn't do ROM daily- rarely.   Had a tick bite 2 months ago or so- between toes on LLE but couldn't feel it- couldn't tell if blood filled, etc. Pt pulled off before wife could see.  Got a huge blister on that area.     Social Hx:  Younger son was hospital in and out- increased fluid in Spinal cord and was attacked by a "crazy man"- on their front porch and wife's hand got broken- since I last saw him.   Pain Inventory  Average Pain 5 Pain Right Now 7 My pain is sharp, burning, dull, stabbing, tingling, and aching  In the last 24 hours, has pain interfered with the following? General activity 10 Relation with others 10 Enjoyment of life 10 What TIME of day is your pain at its worst? morning , daytime, evening, and night Sleep (in general) Poor  Pain is worse with:  . Pain improves with: rest and medication Relief from Meds: 7  Family History  Problem Relation Age of Onset   Hypertension Mother    Diabetes Mother    Cervical cancer Mother    Heart attack Father    Asthma Maternal Grandmother    Food Allergy Son    Seizures Son    Allergies Son    Colitis Son    GER disease Son    Irritable bowel syndrome Son    Asthma Son     Food Allergy Son    Allergies Son    Other Son        eosinophilic esophagitis   Colitis Son    GER disease Son    Asthma Son    Colon cancer Neg Hx    Esophageal cancer Neg Hx    Stomach cancer Neg Hx    Pancreatic cancer Neg Hx    Colon polyps Neg Hx    Social History   Socioeconomic History   Marital status: Married    Spouse name: Not on file   Number of children: 2   Years of education: Not on file   Highest education level: Not on file  Occupational History   Not on file  Tobacco Use   Smoking status: Every Day    Current packs/day: 1.00    Average packs/day: 1 pack/day for 36.9 years (36.9 ttl pk-yrs)    Types: Cigarettes    Start date: 01/01/1987   Smokeless tobacco: Never  Vaping Use   Vaping status: Never Used  Substance and Sexual Activity   Alcohol use: Never    Comment: no beer in over 15 years   Drug use: Not Currently   Sexual activity: Yes  Other Topics Concern   Not on file  Social History Narrative   Not on file   Social Determinants of Health   Financial Resource Strain: Not on file  Food Insecurity: Not on file  Transportation Needs: Not on file  Physical Activity: Not on file  Stress: Not on file  Social Connections: Unknown (01/24/2023)   Received from [facility]   Social Network    Social Network: Not on file   Past Surgical History:  Procedure Laterality Date   BIOPSY  04/08/2022   Procedure: BIOPSY;  Surgeon: Imogene Burn, MD;  Location: [facility];  Service: Gastroenterology;;   COLONOSCOPY WITH PROPOFOL N/A 04/08/2022   Procedure: COLONOSCOPY WITH PROPOFOL;  Surgeon: Imogene Burn, MD;  Location: [facility];  Service: Gastroenterology;  Laterality: N/A;   ESOPHAGOGASTRODUODENOSCOPY (EGD) WITH PROPOFOL N/A 04/08/2022   Procedure: ESOPHAGOGASTRODUODENOSCOPY (EGD) WITH PROPOFOL;  Surgeon: Imogene Burn, MD;  Location: [facility];  Service: Gastroenterology;  Laterality: N/A;   POLYPECTOMY  04/08/2022    Procedure: POLYPECTOMY;  Surgeon: Imogene Burn, MD;  Location: [facility];  Service: Gastroenterology;;   POSTERIOR CERVICAL FUSION/FORAMINOTOMY N/A 06/18/2021   Procedure: POSTERIOR CERVICAL FUSION CERVICAL FIVE THROUGH SIX WITH TRIPLE WIRE TECHNIQUE FIXATION AND RIGHT ILIAC CREST BONE GRAFT HARVEST, LEFT CERVIACL SIX THROUGH SEVEN CERVICAL FORAMINAL LAMINOPLASTY;  Surgeon: Kerrin Champagne,  MD;  Location: [facility];  Service: Orthopedics;  Laterality: N/A;   Past Surgical History:  Procedure Laterality Date   BIOPSY  04/08/2022   Procedure: BIOPSY;  Surgeon: Imogene Burn, MD;  Location: [facility];  Service: Gastroenterology;;   COLONOSCOPY WITH PROPOFOL N/A 04/08/2022   Procedure: COLONOSCOPY WITH PROPOFOL;  Surgeon: Imogene Burn, MD;  Location: [facility];  Service: Gastroenterology;  Laterality: N/A;   ESOPHAGOGASTRODUODENOSCOPY (EGD) WITH PROPOFOL N/A 04/08/2022   Procedure: ESOPHAGOGASTRODUODENOSCOPY (EGD) WITH PROPOFOL;  Surgeon: Imogene Burn, MD;  Location: [facility];  Service: Gastroenterology;  Laterality: N/A;   POLYPECTOMY  04/08/2022   Procedure: POLYPECTOMY;  Surgeon: Imogene Burn, MD;  Location: [facility];  Service: Gastroenterology;;   POSTERIOR CERVICAL FUSION/FORAMINOTOMY N/A 06/18/2021   Procedure: POSTERIOR CERVICAL FUSION CERVICAL FIVE THROUGH SIX WITH TRIPLE WIRE TECHNIQUE FIXATION AND RIGHT ILIAC CREST BONE GRAFT HARVEST, LEFT CERVIACL SIX THROUGH SEVEN CERVICAL FORAMINAL LAMINOPLASTY;  Surgeon: Kerrin Champagne, MD;  Location: [facility];  Service: Orthopedics;  Laterality: N/A;   Past Medical History:  Diagnosis Date   Acute incomplete quadriplegia (HCC) 11/02/2021   Anxiety disorder    Asthma    as a child   Autonomic dysreflexia    Cervical myelopathy (HCC) 06/22/2021   Chest pain of uncertain etiology 10/04/2021   Chronic pain syndrome 11/02/2021   Cigarette smoker 10/04/2021   Depression    Diverticulosis    Essential (primary) hypertension    GERD  (gastroesophageal reflux disease)    Headache    Neurogenic bowel 02/04/2022   Other spondylosis with radiculopathy, cervical region 06/18/2021   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    Palpitations 10/04/2021   Pneumonia    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/2021   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   Spasticity 11/02/2021   Status post cervical spinal fusion 06/18/2021   BP 135/86   Pulse (!) 101   Ht 5' 9.5" (1.765 m)   Wt 204 lb (92.5 kg)   SpO2 97%   BMI 29.69 kg/m   Opioid Risk Score:   Fall Risk Score:  `1  Depression screen [facility] 2/9     08/05/2023    9:11 AM 04/23/2023    9:24 AM 10/18/2022    9:44 AM 06/17/2022    9:14 AM 02/04/2022    9:05 AM 11/02/2021    9:39 AM  Depression screen PHQ 2/9  Decreased Interest 0 1 1 3  0 0  Down, Depressed, Hopeless 0 1 1 3  0 3  PHQ - 2 Score 0 2 2 6  0 3  Altered sleeping      3  Tired, decreased energy      0  Change in appetite      0  Feeling bad or failure about yourself       0  Trouble concentrating      0  Moving slowly or fidgety/restless      0  Suicidal thoughts      0  PHQ-9 Score      6     Review of Systems  Musculoskeletal:  Positive for myalgias.  All other systems reviewed and are negative.     Objective:   Physical Exam Awake, alert, sounds nasally and sniffing a lot- no assistive device- accompanied by wife, NAD Keeps taking big breath- and extra nasally-could be from recent AD episode.   Neuro: No hoffman's on L- but brisk (+) on RUE MAS of 1+ in Ue's B/L MAS of 2 in  LE's- B/L- esp hips-  Clonus 3 beats on RLE- not on LLE.  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  He now has  chronic nerve and chronic pain since  surgery as well. 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  Is a C2 R hemi/SCI/quadriplegia- ASIA D. Now depressed with sleep issues as a result and neurogenic bowel/incontinence. 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   2.  Will see Riley Lam q2 months and then see me q4 months.    3. Needs to call sleep medicine/Pulmonary  doctors- and let them know what's going on. Got big bill since not using.   4. Try using rescue  inhaler before you go to bed- because NOT using Bipap- can increase risk of HTN, Coronary artery disease,  stroke, etc if you don't use.  Call them to see if can change pressure.    5. Vertigo eval- will wait for 2 weeks, but if not better, will send to Neurorehab for Vertigo eval. Likely- pt is nasally- and sinuses full, but could be due to AD episode.    6. Will con't MS Contin and Oxycodone 15 mg BID and 10 mg  #150 monthly- last filled 11/7- will send in  1 month since [facility] bounced back the 2nd month with me and Riley Lam.   7. Doesn't need refills for other meds- con't Duloxetine filled 11/1-Zanaflex/Tizandine- filled 07/08/23; Lyrica filled 08/08/23;    8. Refilled of Linzess 290 mcg daily- #90 1 refill  9. Refilled Dantrolene 100 mg TID- for spasticity with zanaflex   10. Doesn't need more NTG or Zofran right now.  Hasn't been using NTG when this occurs- can try it if BP is high and having AD Symptoms- can be used to break the cycle.    11.  Still smoking- 1/4 to 1/2 ppd- work on getting this down- can reduce blood flow to extremities for 4-6 hours by 50%. Mentioning could be contributing to AD.    12. F/U in 2 months with Riley Lam and 4 months with me- single appt with Riley Lam and double appt with me.    I spent a total of 42   minutes on total care today- >50% coordination of care- due to d/w pt about  smoking cessation; AD; BiPAP, labs needed and spasticity and sensory changes. As well as vertigo

## 2023-11-11 LAB — COMPREHENSIVE METABOLIC PANEL
ALT: 17 [IU]/L (ref 0–44)
AST: 15 [IU]/L (ref 0–40)
Albumin: 4.5 g/dL (ref 4.1–5.1)
Alkaline Phosphatase: 98 [IU]/L (ref 44–121)
BUN/Creatinine Ratio: 21 — ABNORMAL HIGH (ref 9–20)
BUN: 15 mg/dL (ref 6–24)
Bilirubin Total: 0.3 mg/dL (ref 0.0–1.2)
CO2: 27 mmol/L (ref 20–29)
Calcium: 9.9 mg/dL (ref 8.7–10.2)
Chloride: 99 mmol/L (ref 96–106)
Creatinine, Ser: 0.71 mg/dL — ABNORMAL LOW (ref 0.76–1.27)
Globulin, Total: 2.6 g/dL (ref 1.5–4.5)
Glucose: 100 mg/dL — ABNORMAL HIGH (ref 70–99)
Potassium: 5.4 mmol/L — ABNORMAL HIGH (ref 3.5–5.2)
Sodium: 140 mmol/L (ref 134–144)
Total Protein: 7.1 g/dL (ref 6.0–8.5)
eGFR: 115 mL/min/{1.73_m2} (ref >59)

## 2023-11-11 NOTE — Telephone Encounter (Signed)
Called pt to let him know his K+ 5.4- could be due to hemolysis, which I explained- blood cells opening up/causing increased K+ levels- or due to meds ,possibly, but not on BP meds- also not eating a lot of bananas or OJ, oranges- asked them to have PCP redraw in the next 2 weeks or so to see if more elevated or better just to make sure-  They voiced understanding

## 2023-11-14 LAB — DRUG TOX MONITOR 1 W/CONF, ORAL FLD
Amphetamines: NEGATIVE ng/mL (ref <10)
Barbiturates: NEGATIVE ng/mL (ref <10)
Benzodiazepines: NEGATIVE ng/mL (ref <0.50)
Buprenorphine: NEGATIVE ng/mL (ref <0.10)
Cocaine: NEGATIVE ng/mL (ref <5.0)
Codeine: NEGATIVE ng/mL (ref <2.5)
Cotinine: 18.3 ng/mL — ABNORMAL HIGH (ref <5.0)
Dihydrocodeine: NEGATIVE ng/mL (ref <2.5)
Fentanyl: NEGATIVE ng/mL (ref <0.10)
Heroin Metabolite: NEGATIVE ng/mL (ref <1.0)
Hydrocodone: NEGATIVE ng/mL (ref <2.5)
Hydromorphone: NEGATIVE ng/mL (ref <2.5)
MARIJUANA: NEGATIVE ng/mL (ref <2.5)
MDMA: NEGATIVE ng/mL (ref <10)
Meprobamate: NEGATIVE ng/mL (ref <2.5)
Methadone: NEGATIVE ng/mL (ref <5.0)
Morphine: 3.8 ng/mL — ABNORMAL HIGH (ref <2.5)
Nicotine Metabolite: POSITIVE ng/mL — AB (ref <5.0)
Norhydrocodone: NEGATIVE ng/mL (ref <2.5)
Noroxycodone: 14.2 ng/mL — ABNORMAL HIGH (ref <2.5)
Opiates: POSITIVE ng/mL — AB (ref <2.5)
Oxycodone: >250 ng/mL — ABNORMAL HIGH (ref <2.5)
Oxymorphone: NEGATIVE ng/mL (ref <2.5)
Phencyclidine: NEGATIVE ng/mL (ref <10)
Tapentadol: NEGATIVE ng/mL (ref <5.0)
Tramadol: NEGATIVE ng/mL (ref <5.0)
Zolpidem: NEGATIVE ng/mL (ref <5.0)

## 2023-11-14 LAB — DRUG TOX ALC METAB W/CON, ORAL FLD: Alcohol Metabolite: NEGATIVE ng/mL (ref <25)

## 2023-11-25 ENCOUNTER — Other Ambulatory Visit: Payer: Self-pay | Admitting: Physical Medicine and Rehabilitation

## 2023-11-26 MED ORDER — MORPHINE SULFATE ER 15 MG PO TBCR: 30.0000 mg | EXTENDED_RELEASE_TABLET | Freq: Every morning | ORAL | 0 refills | Status: DC

## 2023-11-26 MED ORDER — OXYCODONE HCL 10 MG PO TABS: 10.0000 mg | ORAL_TABLET | ORAL | 0 refills | Status: DC | PRN

## 2023-12-17 ENCOUNTER — Encounter: Payer: Self-pay | Admitting: Physical Medicine and Rehabilitation

## 2023-12-17 ENCOUNTER — Other Ambulatory Visit: Payer: Self-pay | Admitting: Physical Medicine and Rehabilitation

## 2023-12-18 MED ORDER — OXYCODONE HCL 10 MG PO TABS: 5.0000 mg | ORAL_TABLET | ORAL | 0 refills | Status: DC | PRN

## 2023-12-18 MED ORDER — MORPHINE SULFATE ER 15 MG PO TBCR: 15.0000 mg | EXTENDED_RELEASE_TABLET | Freq: Two times a day (BID) | ORAL | 0 refills | Status: DC

## 2023-12-18 NOTE — Telephone Encounter (Signed)
Sent in pain meds early- they are due 12/15- and has new insurance- insurance info is in last note, so can send prior auth to new insurance- please check into this so doesn't run out at [employer]

## 2023-12-30 ENCOUNTER — Telehealth: Payer: Self-pay

## 2023-12-30 ENCOUNTER — Encounter: Payer: Self-pay | Admitting: Physical Medicine and Rehabilitation

## 2023-12-30 NOTE — Telephone Encounter (Signed)
PA will need to be submitted after the 1st.

## 2023-12-30 NOTE — Telephone Encounter (Signed)
PA to give more than a 7 day supply for Morphine and Oxycodone is needed. Pharmacy have a prescription for both to fill.

## 2023-12-31 ENCOUNTER — Encounter: Payer: Self-pay | Admitting: Physical Medicine and Rehabilitation

## 2023-12-31 ENCOUNTER — Other Ambulatory Visit: Payer: Self-pay | Admitting: Physical Medicine and Rehabilitation

## 2024-01-01 MED ORDER — MORPHINE SULFATE ER 15 MG PO TBCR: 15.0000 mg | EXTENDED_RELEASE_TABLET | Freq: Two times a day (BID) | ORAL | 0 refills | Status: DC

## 2024-01-02 NOTE — Progress Notes (Deleted)
 Subjective:    Patient ID: Victor Huang, male    DOB: 1976/03/31, 48 y.o.   MRN: [id number]  HPI   Pain Inventory Average Pain {NUMBERS; 0-10:5044} Pain Right Now {NUMBERS; 0-10:5044} My pain is {PAIN DESCRIPTION:21022940}  In the last 24 hours, has pain interfered with the following? General activity {NUMBERS; 0-10:5044} Relation with others {NUMBERS; 0-10:5044} Enjoyment of life {NUMBERS; 0-10:5044} What TIME of day is your pain at its worst? {time of day:24191} Sleep (in general) {BHH GOOD/FAIR/POOR:22877}  Pain is worse with: {ACTIVITIES:21022942} Pain improves with: {PAIN IMPROVES TPUY:78977056} Relief from Meds: {NUMBERS; 0-10:5044}  Family History  Problem Relation Age of Onset   Hypertension Mother    Diabetes Mother    Cervical cancer Mother    Heart attack Father    Asthma Maternal Grandmother    Food Allergy Son    Seizures Son    Allergies Son    Colitis Son    GER disease Son    Irritable bowel syndrome Son    Asthma Son    Food Allergy Son    Allergies Son    Other Son        eosinophilic esophagitis   Colitis Son    GER disease Son    Asthma Son    Colon cancer Neg Hx    Esophageal cancer Neg Hx    Stomach cancer Neg Hx    Pancreatic cancer Neg Hx    Colon polyps Neg Hx    Social History   Socioeconomic History   Marital status: Married    Spouse name: Not on file   Number of children: 2   Years of education: Not on file   Highest education level: Not on file  Occupational History   Not on file  Tobacco Use   Smoking status: Every Day    Current packs/day: 1.00    Average packs/day: 1 pack/day for 37.0 years (37.0 ttl pk-yrs)    Types: Cigarettes    Start date: 01/01/1987   Smokeless tobacco: Never  Vaping Use   Vaping status: Never Used  Substance and Sexual Activity   Alcohol use: Never    Comment: no beer in over 15 years   Drug use: Not Currently   Sexual activity: Yes  Other Topics Concern   Not on file  Social History  Narrative   Not on file   Social Drivers of Health   Financial Resource Strain: Not on file  Food Insecurity: Not on file  Transportation Needs: Not on file  Physical Activity: Not on file  Stress: Not on file  Social Connections: Unknown (01/24/2023)   Received from [facility]   Social Network    Social Network: Not on file   Past Surgical History:  Procedure Laterality Date   BIOPSY  04/08/2022   Procedure: BIOPSY;  Surgeon: Federico Rosario BROCKS, MD;  Location: [facility];  Service: Gastroenterology;;   COLONOSCOPY WITH PROPOFOL  N/A 04/08/2022   Procedure: COLONOSCOPY WITH PROPOFOL ;  Surgeon: Federico Rosario BROCKS, MD;  Location: [facility];  Service: Gastroenterology;  Laterality: N/A;   ESOPHAGOGASTRODUODENOSCOPY (EGD) WITH PROPOFOL  N/A 04/08/2022   Procedure: ESOPHAGOGASTRODUODENOSCOPY (EGD) WITH PROPOFOL ;  Surgeon: Federico Rosario BROCKS, MD;  Location: [facility];  Service: Gastroenterology;  Laterality: N/A;   POLYPECTOMY  04/08/2022   Procedure: POLYPECTOMY;  Surgeon: Federico Rosario BROCKS, MD;  Location: [facility];  Service: Gastroenterology;;   POSTERIOR CERVICAL FUSION/FORAMINOTOMY N/A 06/18/2021   Procedure: POSTERIOR CERVICAL FUSION CERVICAL FIVE THROUGH SIX  WITH TRIPLE WIRE TECHNIQUE FIXATION AND RIGHT ILIAC CREST BONE GRAFT HARVEST, LEFT CERVIACL SIX THROUGH SEVEN CERVICAL FORAMINAL LAMINOPLASTY;  Surgeon: Lucilla Lynwood BRAVO, MD;  Location: [facility];  Service: Orthopedics;  Laterality: N/A;   Past Surgical History:  Procedure Laterality Date   BIOPSY  04/08/2022   Procedure: BIOPSY;  Surgeon: Federico Rosario BROCKS, MD;  Location: [facility];  Service: Gastroenterology;;   COLONOSCOPY WITH PROPOFOL  N/A 04/08/2022   Procedure: COLONOSCOPY WITH PROPOFOL ;  Surgeon: Federico Rosario BROCKS, MD;  Location: [facility];  Service: Gastroenterology;  Laterality: N/A;   ESOPHAGOGASTRODUODENOSCOPY (EGD) WITH PROPOFOL  N/A 04/08/2022   Procedure: ESOPHAGOGASTRODUODENOSCOPY (EGD) WITH PROPOFOL ;  Surgeon:  Federico Rosario BROCKS, MD;  Location: [facility];  Service: Gastroenterology;  Laterality: N/A;   POLYPECTOMY  04/08/2022   Procedure: POLYPECTOMY;  Surgeon: Federico Rosario BROCKS, MD;  Location: [facility];  Service: Gastroenterology;;   POSTERIOR CERVICAL FUSION/FORAMINOTOMY N/A 06/18/2021   Procedure: POSTERIOR CERVICAL FUSION CERVICAL FIVE THROUGH SIX WITH TRIPLE WIRE TECHNIQUE FIXATION AND RIGHT ILIAC CREST BONE GRAFT HARVEST, LEFT CERVIACL SIX THROUGH SEVEN CERVICAL FORAMINAL LAMINOPLASTY;  Surgeon: Lucilla Lynwood BRAVO, MD;  Location: [facility];  Service: Orthopedics;  Laterality: N/A;   Past Medical History:  Diagnosis Date   Acute incomplete quadriplegia (HCC) 11/02/2021   Anxiety disorder    Asthma    as a child   Autonomic dysreflexia    Cervical myelopathy (HCC) 06/22/2021   Chest pain of uncertain etiology 10/04/2021   Chronic pain syndrome 11/02/2021   Cigarette smoker 10/04/2021   Depression    Diverticulosis    Essential (primary) hypertension    GERD (gastroesophageal reflux disease)    Headache    Neurogenic bowel 02/04/2022   Other spondylosis with radiculopathy, cervical region 06/18/2021   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  Opioid Risk Score:   Fall Risk Score:  `1  Depression screen [facility] 2/9     08/05/2023    9:11 AM 04/23/2023    9:24 AM 10/18/2022    9:44 AM 06/17/2022    9:14 AM 02/04/2022    9:05 AM 11/02/2021    9:39 AM  Depression screen PHQ 2/9  Decreased Interest 0 1 1 3  0 0  Down, Depressed, Hopeless 0 1 1 3  0 3  PHQ - 2 Score 0 2 2 6  0 3  Altered sleeping      3  Tired, decreased energy      0  Change in appetite      0  Feeling bad or failure about yourself       0  Trouble concentrating      0  Moving slowly or fidgety/restless       0  Suicidal thoughts      0  PHQ-9 Score      6    Review of Systems     Objective:   Physical Exam        Assessment & Plan:

## 2024-01-05 ENCOUNTER — Encounter: Payer: Medicare Other | Admitting: Registered Nurse

## 2024-01-06 ENCOUNTER — Encounter: Payer: Medicare Other | Attending: Registered Nurse | Admitting: Registered Nurse

## 2024-01-06 VITALS — BP 133/95 | HR 96 | Ht 69.0 in | Wt 205.0 lb

## 2024-01-06 DIAGNOSIS — M5412 Radiculopathy, cervical region: Secondary | ICD-10-CM | POA: Diagnosis not present

## 2024-01-06 DIAGNOSIS — G904 Autonomic dysreflexia: Secondary | ICD-10-CM | POA: Insufficient documentation

## 2024-01-06 DIAGNOSIS — Z5181 Encounter for therapeutic drug level monitoring: Secondary | ICD-10-CM | POA: Diagnosis present

## 2024-01-06 DIAGNOSIS — G8250 Quadriplegia, unspecified: Secondary | ICD-10-CM | POA: Diagnosis not present

## 2024-01-06 DIAGNOSIS — M542 Cervicalgia: Secondary | ICD-10-CM | POA: Insufficient documentation

## 2024-01-06 DIAGNOSIS — G894 Chronic pain syndrome: Secondary | ICD-10-CM | POA: Diagnosis present

## 2024-01-06 MED ORDER — MORPHINE SULFATE ER 15 MG PO TBCR: 30.0000 mg | EXTENDED_RELEASE_TABLET | Freq: Every morning | ORAL | 0 refills | Status: DC

## 2024-01-06 MED ORDER — OXYCODONE HCL 10 MG PO TABS: 5.0000 mg | ORAL_TABLET | ORAL | 0 refills | Status: DC | PRN

## 2024-01-06 NOTE — Progress Notes (Signed)
 Subjective:    Patient ID: Victor Huang, male    DOB: Jan 21, 1976, 48 y.o.   MRN: [id number]  HPI: Victor Huang is a 48 y.o. male who returns for follow up appointment for chronic pain and medication refill. He states his  pain is located in his neck radiating into his bilateral shoulders, also reports increase intensity of neck pain and frequency, he asked about MRI. This was discussed with Dr Cornelio, Referral placed with Neurosurgery. Victor Huang is aware of the above via My-Chart message.    Also reports upper back pain. He rates his pain 7.His  current exercise regime is walking and performing stretching exercises.  Victor Huang Morphine  equivalent is 120.00 MME.   Last Oral Swab was Performed on 11/10/2023, it was consistent.    Pain Inventory Average Pain 5 Pain Right Now 7 My pain is sharp, burning, dull, stabbing, tingling, and aching  In the last 24 hours, has pain interfered with the following? General activity 10 Relation with others 10 Enjoyment of life 10 What TIME of day is your pain at its worst? morning , daytime, evening, and night Sleep (in general) Poor  Pain is worse with: unsure Pain improves with: rest and medication Relief from Meds: 7  Family History  Problem Relation Age of Onset   Hypertension Mother    Diabetes Mother    Cervical cancer Mother    Heart attack Father    Asthma Maternal Grandmother    Food Allergy Son    Seizures Son    Allergies Son    Colitis Son    GER disease Son    Irritable bowel syndrome Son    Asthma Son    Food Allergy Son    Allergies Son    Other Son        eosinophilic esophagitis   Colitis Son    GER disease Son    Asthma Son    Colon cancer Neg Hx    Esophageal cancer Neg Hx    Stomach cancer Neg Hx    Pancreatic cancer Neg Hx    Colon polyps Neg Hx    Social History   Socioeconomic History   Marital status: Married    Spouse name: Not on file   Number of children: 2   Years of education: Not on  file   Highest education level: Not on file  Occupational History   Not on file  Tobacco Use   Smoking status: Every Day    Current packs/day: 1.00    Average packs/day: 1 pack/day for 37.0 years (37.0 ttl pk-yrs)    Types: Cigarettes    Start date: 01/01/1987   Smokeless tobacco: Never  Vaping Use   Vaping status: Never Used  Substance and Sexual Activity   Alcohol use: Never    Comment: no beer in over 15 years   Drug use: Not Currently   Sexual activity: Yes  Other Topics Concern   Not on file  Social History Narrative   Not on file   Social Drivers of Health   Financial Resource Strain: Not on file  Food Insecurity: Not on file  Transportation Needs: Not on file  Physical Activity: Not on file  Stress: Not on file  Social Connections: Unknown (01/24/2023)   Received from [facility]   Social Network    Social Network: Not on file   Past Surgical History:  Procedure Laterality Date   BIOPSY  04/08/2022   Procedure:  BIOPSY;  Surgeon: Federico Rosario BROCKS, MD;  Location: [facility];  Service: Gastroenterology;;   COLONOSCOPY WITH PROPOFOL  N/A 04/08/2022   Procedure: COLONOSCOPY WITH PROPOFOL ;  Surgeon: Federico Rosario BROCKS, MD;  Location: [facility];  Service: Gastroenterology;  Laterality: N/A;   ESOPHAGOGASTRODUODENOSCOPY (EGD) WITH PROPOFOL  N/A 04/08/2022   Procedure: ESOPHAGOGASTRODUODENOSCOPY (EGD) WITH PROPOFOL ;  Surgeon: Federico Rosario BROCKS, MD;  Location: [facility];  Service: Gastroenterology;  Laterality: N/A;   POLYPECTOMY  04/08/2022   Procedure: POLYPECTOMY;  Surgeon: Federico Rosario BROCKS, MD;  Location: [facility];  Service: Gastroenterology;;   POSTERIOR CERVICAL FUSION/FORAMINOTOMY N/A 06/18/2021   Procedure: POSTERIOR CERVICAL FUSION CERVICAL FIVE THROUGH SIX WITH TRIPLE WIRE TECHNIQUE FIXATION AND RIGHT ILIAC CREST BONE GRAFT HARVEST, LEFT CERVIACL SIX THROUGH SEVEN CERVICAL FORAMINAL LAMINOPLASTY;  Surgeon: Lucilla Lynwood BRAVO, MD;  Location: [facility];  Service:  Orthopedics;  Laterality: N/A;   Past Surgical History:  Procedure Laterality Date   BIOPSY  04/08/2022   Procedure: BIOPSY;  Surgeon: Federico Rosario BROCKS, MD;  Location: [facility];  Service: Gastroenterology;;   COLONOSCOPY WITH PROPOFOL  N/A 04/08/2022   Procedure: COLONOSCOPY WITH PROPOFOL ;  Surgeon: Federico Rosario BROCKS, MD;  Location: [facility];  Service: Gastroenterology;  Laterality: N/A;   ESOPHAGOGASTRODUODENOSCOPY (EGD) WITH PROPOFOL  N/A 04/08/2022   Procedure: ESOPHAGOGASTRODUODENOSCOPY (EGD) WITH PROPOFOL ;  Surgeon: Federico Rosario BROCKS, MD;  Location: [facility];  Service: Gastroenterology;  Laterality: N/A;   POLYPECTOMY  04/08/2022   Procedure: POLYPECTOMY;  Surgeon: Federico Rosario BROCKS, MD;  Location: [facility];  Service: Gastroenterology;;   POSTERIOR CERVICAL FUSION/FORAMINOTOMY N/A 06/18/2021   Procedure: POSTERIOR CERVICAL FUSION CERVICAL FIVE THROUGH SIX WITH TRIPLE WIRE TECHNIQUE FIXATION AND RIGHT ILIAC CREST BONE GRAFT HARVEST, LEFT CERVIACL SIX THROUGH SEVEN CERVICAL FORAMINAL LAMINOPLASTY;  Surgeon: Lucilla Lynwood BRAVO, MD;  Location: [facility];  Service: Orthopedics;  Laterality: N/A;   Past Medical History:  Diagnosis Date   Acute incomplete quadriplegia (HCC) 11/02/2021   Anxiety disorder    Asthma    as a child   Autonomic dysreflexia    Cervical myelopathy (HCC) 06/22/2021   Chest pain of uncertain etiology 10/04/2021   Chronic pain syndrome 11/02/2021   Cigarette smoker 10/04/2021   Depression    Diverticulosis    Essential (primary) hypertension    GERD (gastroesophageal reflux disease)    Headache    Neurogenic bowel 02/04/2022   Other spondylosis with radiculopathy, cervical region 06/18/2021   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    Palpitations 10/04/2021   Pneumonia    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/2021   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   Spasticity 11/02/2021   Status post cervical spinal fusion 06/18/2021   BP (!) 143/95   Pulse 96   Ht 5' 9 (1.753 m)   Wt 205  lb (93 kg)   SpO2 99%   BMI 30.27 kg/m   Opioid Risk Score:   Fall Risk Score:  `1  Depression screen [facility] 2/9     08/05/2023    9:11 AM 04/23/2023    9:24 AM 10/18/2022    9:44 AM 06/17/2022    9:14 AM 02/04/2022    9:05 AM 11/02/2021    9:39 AM  Depression screen PHQ 2/9  Decreased Interest 0 1 1 3  0 0  Down, Depressed, Hopeless 0 1 1 3  0 3  PHQ - 2 Score 0 2 2 6  0 3  Altered sleeping      3  Tired, decreased energy      0  Change  in appetite      0  Feeling bad or failure about yourself       0  Trouble concentrating      0  Moving slowly or fidgety/restless      0  Suicidal thoughts      0  PHQ-9 Score      6      Review of Systems  Musculoskeletal:  Positive for back pain and neck pain.  Neurological:  Positive for headaches.  All other systems reviewed and are negative.     Objective:   Physical Exam Vitals and nursing note reviewed.  Constitutional:      Appearance: Normal appearance.  Neck:     Comments: Cervical Paraspinal Tenderness: C-5-C-6  Cardiovascular:     Rate and Rhythm: Normal rate and regular rhythm.     Pulses: Normal pulses.     Heart sounds: Normal heart sounds.  Pulmonary:     Effort: Pulmonary effort is normal.     Breath sounds: Normal breath sounds.  Musculoskeletal:     Comments: Normal Muscle Bulk and Muscle Testing Reveals:  Upper Extremities: Full ROM and Muscle Strength 5/5 Bilateral AC Joint Tenderness  Thoracic Paraspinal Tenderness: T-1-T-4 Lumbar Paraspinal Tenderness: L-4-L-5 Lower Extremities: Full ROM and Muscle Strength 5/5 Arises from Table with Ease Narrow Based  Gait     Skin:    General: Skin is warm and dry.  Neurological:     Mental Status: He is alert and oriented to person, place, and time.  Psychiatric:        Mood and Affect: Mood normal.        Behavior: Behavior normal.         Assessment & Plan:  Acute Incomplete Quadriplegia: Continue with HEP as tolerated. Continue to Monitor.  01/06/2024 Autonomic Dysreflexia: Cannot Stand for more than 10- 20 minutes Continue current medication regimen. Continue to monitor. 01/06/2024 Cervicalgia/ Cervical Radiculitis:Referral Place to Neurosurgery, this was discussed with Dr Cornelio  Continue Pregabalin . Continue to Monitor. 01/06/2024 Chronic Pain Syndrome: Refilled: MS Contin  ER 15 mg take 2  tablets in the morning and one tablet in the evening #90 and Oxycodone  10 mg one tablet every 4 hours as needed for pain #150. Second script sent for the following moth. We will continue the opioid monitoring program, this consists of regular clinic visits, examinations, urine drug screen, pill counts as well as use of [place]  Controlled Substance Reporting system. A 12 month History has been reviewed on the [place]  Controlled Substance Reporting System on 01/06/2024  F/U with Dr Cornelio in 2 months.

## 2024-01-07 ENCOUNTER — Encounter: Payer: Self-pay | Admitting: Registered Nurse

## 2024-01-08 NOTE — Telephone Encounter (Signed)
(  Key: [id number]) PA Case ID #: [id number] Morphine Sulfate

## 2024-01-09 NOTE — Telephone Encounter (Signed)
 Approved on January 9 by [facility] Medicaid 2017 Approved. This drug has been approved. Approved quantity: 90 tablets per 30 day(s). You may fill up to a 34 day supply at a retail pharmacy. You may fill up to a 90 day supply for maintenance drugs, please refer to the formulary for details. Please call the pharmacy to process your prescription claim. Authorization Expiration Date: 04/07/2024 Morphine  Sulfate

## 2024-01-26 ENCOUNTER — Encounter: Payer: Self-pay | Admitting: Physical Medicine and Rehabilitation

## 2024-01-28 ENCOUNTER — Telehealth: Payer: Self-pay

## 2024-01-28 MED ORDER — MORPHINE SULFATE ER 15 MG PO TBCR: 30.0000 mg | EXTENDED_RELEASE_TABLET | Freq: Every morning | ORAL | 0 refills | Status: DC

## 2024-01-28 NOTE — Telephone Encounter (Signed)
Patient requesting refill on on morphine [facility] [place] on [address], current pharmacy only has 70 in stock and not sure when they will be getting more, would like the full [facility] [place] per pmp last fill was:  Filled  Written  ID  Drug  QTY  Days  Prescriber  RX #  Dispenser  Refill  Daily Dose*  Pymt Type  PMP  01/01/2024 01/01/2024 1  Morphine Sulf Er 15 Mg Tablet 90.00 30 Me Lov [id number] Nor (0149) 0/0 45.00 MME Private Pay [place]

## 2024-01-28 NOTE — Telephone Encounter (Signed)
PA submitted for oxycodone  Ashish Rossetti (Key: [id number])

## 2024-01-28 NOTE — Telephone Encounter (Signed)
PMP was Reviewed.  MS Contin e-scribed to pharmacy. Victor Huang is aware via My-Chart

## 2024-01-28 NOTE — Telephone Encounter (Signed)
PA submitted for morphine  Victor Huang (Key: [id number])

## 2024-01-29 ENCOUNTER — Telehealth: Payer: Self-pay | Admitting: *Deleted

## 2024-01-29 NOTE — Telephone Encounter (Signed)
[facility] for information about authorization of Morphine Sulfate ER 15 mg #90 and Oxycodone 10 mg #150. They have given information on the [facility] program which I called . Authorization should go through with the ICD 10 code G89.4 but I have to call the pharmacy when they re open after lunch @2pm . If they will not go through for the full amount, he should be able to get 7 days of each and then we send new Rx for the remaining amount and it should go through. OF NOTE: the [place] Program is temporary and will end coverage on 01/30/24 and his Medicare program should assume coverage.    I spoke with pharmacy and the medication processed and notified Victor Huang's wife that they could pick up his medication.

## 2024-01-29 NOTE — Telephone Encounter (Signed)
Oxycodone 10 mg approved 01/28/24-07/26/24

## 2024-02-07 ENCOUNTER — Other Ambulatory Visit: Payer: Self-pay | Admitting: Physical Medicine and Rehabilitation

## 2024-02-18 ENCOUNTER — Encounter: Payer: Self-pay | Admitting: Physical Medicine and Rehabilitation

## 2024-02-19 ENCOUNTER — Other Ambulatory Visit (HOSPITAL_COMMUNITY): Payer: Self-pay | Admitting: Physical Medicine and Rehabilitation

## 2024-02-19 MED ORDER — MORPHINE SULFATE ER 15 MG PO TBCR: 30.0000 mg | EXTENDED_RELEASE_TABLET | Freq: Every morning | ORAL | 0 refills | Status: DC

## 2024-02-19 MED ORDER — OXYCODONE HCL 10 MG PO TABS: 5.0000 mg | ORAL_TABLET | ORAL | 0 refills | Status: DC | PRN

## 2024-02-28 ENCOUNTER — Other Ambulatory Visit: Payer: Self-pay | Admitting: Physical Medicine and Rehabilitation

## 2024-03-09 NOTE — Progress Notes (Unsigned)
 Subjective:    Patient ID: Victor Huang, male    DOB: Dec 31, 1975, 48 y.o.   MRN: [id number]  HPI  An audio/video tele-health visit is felt to be the most appropriate encounter for this patient at this time. This is a follow up tele-visit via phone. The patient is at home. MD is at office. Prior to scheduling this appointment, our staff discussed the limitations of evaluation and management by telemedicine and the availability of in-person appointments. The patient expressed understanding and agreed to proceed.    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  He now has chronic nerve and chronic pain since  surgery as well. 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  Is a C2 R hemi/SCI/quadriplegia- ASIA D. Now depressed with sleep issues as a result and neurogenic bowel/incontinence. Also has fibromyalgia-    Just dx'd with severe COPD and probable pulmonary fibrosis   Here for f/u on SCI.   Not feeling well today-  was running really late and didn't feel well  Last night dealing with AD episodes-  might have been set off by being out of the house- went to see [facility]- Dr Franky Macho. Was out and about.   Had kidney stone- and it's set off AD more- can get them under control- getting a couple episodes/day.  Lasting 30 on average- some are longer-   Medication takes time to work.  If starts low- will take Metoprolol- then takes extra Lyrica/muscle relaxer and sometimes extra pain meds- that will "get it to go away".   About 1 month ago- it started frequently.   Pt thinks his neck was getting worse.  Dr Franky Macho- Did order MRI- - went 2 days ago.   Is off balance- when walking as well. .   Never tried NTG-  Was concerned because of HA.   AD Signs- first sign is nasal congestion and second sign is AD HA.   On regular basis- Lyrica,  takes 2 tabs/day- but then leaves him 2 additional meds if has AD.  4 doses/day makes him feel like a zombie- didn't like it at all.   If neck tightens up, will give extra lyrica.  Feels like something has changed- numbness is "spreading".   Now has spot on L thigh that's numb which is new-  R hand is also more numb- 1st 3 fingers are the worst-    C/O RUE is "frozen".  When touches with other arm/hand and hand and arm is ice cold. Intermittently- Becomes purple red or pale   Doesn't have a thermostat- with baseboard heaters- but thinks 68-69 degrees with AC- but not clear when using heaters- thinks 71-72 degrees. .   Letter from medicare- filled   Medicaid has program so pt's wife can be hired on as caregiver.     Pain Inventory Average Pain 5 Pain Right Now 4 My pain is constant, sharp, burning, dull, stabbing, tingling, and aching  In the last 24 hours, has pain interfered with the following? General activity 10 Relation with others 5 Enjoyment of life 6 What TIME of day is your pain at its worst? evening and night Sleep (in general) Fair  Pain is worse with: bending, sitting, standing, and sitting up Pain improves with: rest and medication Relief from Meds: 10  Family History  Problem Relation Age of Onset   Hypertension Mother    Diabetes Mother    Cervical cancer Mother    Heart attack Father    Asthma Maternal Grandmother    Food Allergy Son    Seizures Son    Allergies Son    Colitis Son    GER disease Son    Irritable bowel syndrome Son    Asthma Son    Food Allergy Son    Allergies Son    Other Son        eosinophilic esophagitis   Colitis Son    GER disease Son    Asthma Son    Colon cancer Neg Hx    Esophageal cancer Neg Hx    Stomach cancer Neg Hx    Pancreatic cancer Neg Hx    Colon polyps Neg Hx    Social History   Socioeconomic History   Marital status: Married    Spouse name: Not on file   Number of children: 2   Years of  education: Not on file   Highest education level: Not on file  Occupational History   Not on file  Tobacco Use   Smoking status: Every Day    Current packs/day: 1.00    Average packs/day: 1 pack/day for 37.2 years (37.2 ttl pk-yrs)    Types: Cigarettes    Start date: 01/01/1987   Smokeless tobacco: Never  Vaping Use   Vaping status: Never Used  Substance and Sexual Activity   Alcohol use: Never    Comment: no beer in over 15 years   Drug use: Not Currently   Sexual activity: Yes  Other Topics Concern   Not on file  Social History Narrative   Not on file   Social Drivers of Health   Financial Resource Strain: Not on file  Food Insecurity: Not on file  Transportation Needs: Not on file  Physical Activity: Not on file  Stress: Not on file  Social Connections: Unknown (01/24/2023)   Received from [facility]   Social Network    Social Network: Not on file   Past Surgical History:  Procedure Laterality Date   BIOPSY  04/08/2022   Procedure: BIOPSY;  Surgeon: Imogene Burn, MD;  Location: [facility];  Service: Gastroenterology;;   COLONOSCOPY WITH PROPOFOL N/A 04/08/2022   Procedure: COLONOSCOPY WITH PROPOFOL;  Surgeon: Imogene Burn, MD;  Location: [facility];  Service: Gastroenterology;  Laterality: N/A;   ESOPHAGOGASTRODUODENOSCOPY (EGD) WITH PROPOFOL N/A 04/08/2022   Procedure: ESOPHAGOGASTRODUODENOSCOPY (EGD) WITH PROPOFOL;  Surgeon: Imogene Burn, MD;  Location: [facility];  Service: Gastroenterology;  Laterality: N/A;   POLYPECTOMY  04/08/2022   Procedure: POLYPECTOMY;  Surgeon: Imogene Burn, MD;  Location: [facility];  Service: Gastroenterology;;   POSTERIOR CERVICAL FUSION/FORAMINOTOMY N/A 06/18/2021   Procedure: POSTERIOR CERVICAL FUSION CERVICAL FIVE THROUGH SIX WITH TRIPLE WIRE TECHNIQUE FIXATION AND RIGHT ILIAC CREST BONE GRAFT HARVEST, LEFT CERVIACL SIX THROUGH SEVEN CERVICAL FORAMINAL LAMINOPLASTY;  Surgeon: Kerrin Champagne, MD;  Location:  [facility];  Service: Orthopedics;  Laterality: N/A;   Past Surgical History:  Procedure Laterality Date   BIOPSY  04/08/2022   Procedure: BIOPSY;  Surgeon: Imogene Burn, MD;  Location: [facility];  Service: Gastroenterology;;   COLONOSCOPY WITH PROPOFOL N/A 04/08/2022   Procedure: COLONOSCOPY WITH PROPOFOL;  Surgeon: Imogene Burn, MD;  Location: [facility];  Service: Gastroenterology;  Laterality: N/A;   ESOPHAGOGASTRODUODENOSCOPY (EGD) WITH PROPOFOL N/A 04/08/2022  Procedure: ESOPHAGOGASTRODUODENOSCOPY (EGD) WITH PROPOFOL;  Surgeon: Imogene Burn, MD;  Location: [facility];  Service: Gastroenterology;  Laterality: N/A;   POLYPECTOMY  04/08/2022   Procedure: POLYPECTOMY;  Surgeon: Imogene Burn, MD;  Location: [facility];  Service: Gastroenterology;;   POSTERIOR CERVICAL FUSION/FORAMINOTOMY N/A 06/18/2021   Procedure: POSTERIOR CERVICAL FUSION CERVICAL FIVE THROUGH SIX WITH TRIPLE WIRE TECHNIQUE FIXATION AND RIGHT ILIAC CREST BONE GRAFT HARVEST, LEFT CERVIACL SIX THROUGH SEVEN CERVICAL FORAMINAL LAMINOPLASTY;  Surgeon: Kerrin Champagne, MD;  Location: [facility];  Service: Orthopedics;  Laterality: N/A;   Past Medical History:  Diagnosis Date   Acute incomplete quadriplegia (HCC) 11/02/2021   Anxiety disorder    Asthma    as a child   Autonomic dysreflexia    Cervical myelopathy (HCC) 06/22/2021   Chest pain of uncertain etiology 10/04/2021   Chronic pain syndrome 11/02/2021   Cigarette smoker 10/04/2021   Depression    Diverticulosis    Essential (primary) hypertension    GERD (gastroesophageal reflux disease)    Headache    Neurogenic bowel 02/04/2022   Other spondylosis with radiculopathy, cervical region 06/18/2021   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  Opioid Risk Score:   Fall Risk Score:  `1  Depression screen [facility] 2/9     08/05/2023    9:11 AM 04/23/2023    9:24 AM 10/18/2022    9:44 AM 06/17/2022    9:14 AM 02/04/2022    9:05 AM 11/02/2021    9:39 AM  Depression screen PHQ 2/9  Decreased Interest 0 1 1 3  0 0  Down, Depressed, Hopeless 0 1 1 3  0 3  PHQ - 2 Score 0 2 2 6  0 3  Altered sleeping      3  Tired, decreased energy      0  Change in appetite      0  Feeling bad or failure about yourself       0  Trouble concentrating      0  Moving slowly or fidgety/restless      0  Suicidal thoughts      0  PHQ-9 Score      6    Review of Systems  Musculoskeletal:  Positive for back pain and neck pain.       Pain all over the body Pain in both shoulders  All other systems reviewed and are negative.      Objective:   Physical Exam   WebEx Able to show areas of numbness L axilla and triceps area- as well as 4th/5th digits on LUE        Assessment & Plan:    Pt is a 48 yr old male with hx of incomplete quadriplegia  (C2 R hemi/quadriplegia ASIA D) - injury 2022-    and s/p C5/7 posterior cervical fusion by Dr Otelia Sergeant- 06/18/21.  He now has chronic nerve and chronic pain since  surgery as well. 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  Is a C2 R hemi/SCI/quadriplegia- ASIA D. Now depressed with sleep issues as a result and neurogenic bowel/incontinence. 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 Metoprolol Immediate release lasts 6-8 hours, so that concerns me.Take NTG first then metoprolol if need be.     2.  Sounds like developing Raynaud's in RUE- but not sure why.   3.  Needs pain meds- MS Contin and Oxycodone- last filled 2/26- but last sent in 2/20- so can send in now.    4.  Con't Duloxetine and Lyrica. Has refills.    5. Patient has a Spinal cord injury - so due to severe nerve pain, that was not controlled on Duloxetine itself, we've needed to have patient also take high doses of Lyrica- which is the standard of care-  to get nerve pain better controlled.    6. Send me paperwork to fill out for pt to have caregiver at all times- Patient cannot be left alone- due to frequent episodes of autonomic dysreflexia, which dramatically increases pt risk of a stroke, having at least 2x/day at this time- needs to have someone with him at all times- he also has severe balance issues and has fallen, so to reduce risk of falls, which could make his Spinal cord injury much worse- so again, needs wife/family member to stay with him 24/hours/day.    7. Will give Lidocaine ointment 5% up to 4x/day as needed for muscle tightness and nerve pain in neck- can use dime sized amount at a time- but no heat for 6 hours after application. In same area.    8. Don't use Nitroglycerin (NTG) with Viagra- hasn't been using- since concerned about cardiac Symptoms. Don't take Viagra instead of NTG!  9. Because BP not always elevated- don't have ability to give metoprolol all the time to prevent AD- because BP is controlled usually.    10. Can try over the counter penile pump- gets AD after has tried.      11.  We discussed there aren't many choices to PREVENT AD- but can try Valium- 2.5-5 mg up to 2x/day- for prevention of Autonomic dysreflexia- don't have may other choices- tried all traditional meds for this .so don't have a choice.   12. Valium potentiates pain meds- so don't take more than prescribed.    13. F/U in 2 months with Eunice/and me in4 months double appt- SCI  14. Let me know what imaging /Dr Cabbell comes up with.    I spent a total of 42   minutes on total care today- >50% coordination of care- due to complicated medical issues with paperwork- and d/w prevention of AD-  and d/w about Neck [facility]- as detailed above

## 2024-03-10 ENCOUNTER — Encounter: Payer: Medicare Other | Attending: Registered Nurse | Admitting: Physical Medicine and Rehabilitation

## 2024-03-10 ENCOUNTER — Encounter: Payer: Self-pay | Admitting: Physical Medicine and Rehabilitation

## 2024-03-10 VITALS — BP 131/80 | HR 87 | Ht 69.0 in | Wt 207.0 lb

## 2024-03-10 DIAGNOSIS — G894 Chronic pain syndrome: Secondary | ICD-10-CM | POA: Insufficient documentation

## 2024-03-10 DIAGNOSIS — Z79891 Long term (current) use of opiate analgesic: Secondary | ICD-10-CM | POA: Diagnosis not present

## 2024-03-10 DIAGNOSIS — Z5181 Encounter for therapeutic drug level monitoring: Secondary | ICD-10-CM | POA: Diagnosis not present

## 2024-03-10 DIAGNOSIS — G8250 Quadriplegia, unspecified: Secondary | ICD-10-CM | POA: Diagnosis not present

## 2024-03-10 DIAGNOSIS — G904 Autonomic dysreflexia: Secondary | ICD-10-CM | POA: Insufficient documentation

## 2024-03-10 MED ORDER — MORPHINE SULFATE ER 15 MG PO TBCR: 30.0000 mg | EXTENDED_RELEASE_TABLET | Freq: Every morning | ORAL | 0 refills | Status: DC

## 2024-03-10 MED ORDER — OXYCODONE HCL 10 MG PO TABS: 5.0000 mg | ORAL_TABLET | ORAL | 0 refills | Status: DC | PRN

## 2024-03-10 MED ORDER — NITROGLYCERIN 0.4 MG SL SUBL: 0.4000 mg | SUBLINGUAL_TABLET | SUBLINGUAL | 11 refills | Status: AC | PRN

## 2024-03-10 MED ORDER — LIDOCAINE 5 % EX OINT: 1.0000 | TOPICAL_OINTMENT | Freq: Four times a day (QID) | CUTANEOUS | 5 refills | Status: DC | PRN

## 2024-03-10 MED ORDER — DIAZEPAM 5 MG PO TABS
2.5000 mg | ORAL_TABLET | Freq: Two times a day (BID) | ORAL | 1 refills | Status: DC
Start: 2024-03-10 — End: 2024-05-06

## 2024-03-10 NOTE — Patient Instructions (Signed)
  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  He now has chronic nerve and chronic pain since  surgery as well. 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  Is a C2 R hemi/SCI/quadriplegia- ASIA D. Now depressed with sleep issues as a result and neurogenic bowel/incontinence. 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 Metoprolol Immediate release lasts 6-8 hours, so that concerns me.Take NTG first then metoprolol if need be.     2.  Sounds like developing Raynaud's in RUE- but not sure why.   3.  Needs pain meds- MS Contin and Oxycodone- last filled 2/26- but last sent in 2/20- so can send in now.    4. Con't Duloxetine and Lyrica. Has refills.    5. Patient has a Spinal cord injury - so due to severe nerve pain, that was not controlled on Duloxetine itself, we've needed to have patient also take high doses of Lyrica- which is the standard of care-  to get nerve pain better controlled.    6. Send me paperwork to fill out for pt to have caregiver at all times- Patient cannot be left alone- due to frequent episodes of autonomic dysreflexia, which dramatically increases pt risk of a stroke, having at least 2x/day at this time- needs to have someone with him at all times- he also has severe balance issues and has fallen, so to reduce risk of falls, which could make his Spinal cord injury much worse- so again, needs wife/family member to stay with him 24/hours/day.    7. Will give Lidocaine ointment 5% up to 4x/day as needed for muscle tightness and nerve pain in neck- can use dime sized amount at a time- but no heat for 6 hours after application.  In same area.    8. Don't use Nitroglycerin (NTG) with Viagra- hasn't been using- since concerned about cardiac Symptoms. Don't take Viagra instead of NTG!  9. Because BP not always elevated- don't have ability to give metoprolol all the time to prevent AD- because BP is controlled usually.    10. Can try over the counter penile pump- gets AD after has tried.      11.  We discussed there aren't many choices to PREVENT AD- but can try Valium- 2.5-5 mg up to 2x/day- for prevention of Autonomic dysreflexia- don't have may other choices- tried all traditional meds for this .so don't have a choice.   12. Valium potentiates pain meds- so don't take more than prescribed.    13. F/U in 2 months with Eunice/and me in4 months double appt- SCI  14. Let me know what imaging /Dr Cabbell comes up with.

## 2024-04-06 ENCOUNTER — Other Ambulatory Visit: Payer: Self-pay

## 2024-04-07 ENCOUNTER — Ambulatory Visit: Admitting: Cardiology

## 2024-04-15 ENCOUNTER — Other Ambulatory Visit: Payer: Self-pay | Admitting: Physical Medicine and Rehabilitation

## 2024-04-17 MED ORDER — MORPHINE SULFATE ER 15 MG PO TBCR: 30.0000 mg | EXTENDED_RELEASE_TABLET | Freq: Every morning | ORAL | 0 refills | Status: DC

## 2024-04-17 MED ORDER — OXYCODONE HCL 10 MG PO TABS: 5.0000 mg | ORAL_TABLET | ORAL | 0 refills | Status: DC | PRN

## 2024-04-21 ENCOUNTER — Encounter: Payer: Self-pay | Admitting: Physical Medicine and Rehabilitation

## 2024-04-23 ENCOUNTER — Encounter: Payer: Self-pay | Admitting: *Deleted

## 2024-04-23 ENCOUNTER — Telehealth: Payer: Self-pay | Admitting: *Deleted

## 2024-04-23 NOTE — Telephone Encounter (Signed)
 Oxycodone  10 mg #150 APPROVED THROUGH 12/29/24.

## 2024-04-23 NOTE — Telephone Encounter (Signed)
 Prior Auth initiated with [facility] for Oxycodone   10 mg #150 and Morphine  Sulfate Er 15 mg# 90 by phone.

## 2024-04-29 ENCOUNTER — Ambulatory Visit: Admitting: Cardiology

## 2024-05-02 ENCOUNTER — Encounter: Payer: Self-pay | Admitting: Cardiology

## 2024-05-02 NOTE — Progress Notes (Deleted)
  Cardiology Office Note:  .   Date:  05/02/2024  ID:  Victor Huang, DOB 05/19/1976, MRN [id number] PCP: Marce Sensing, NP  [facility] Providers Cardiologist:  Nelia Balzarine, MD { Click to update primary MD,subspecialty MD or APP then REFRESH:1}   History of Present Illness: .   Victor Huang is a 48 y.o. male with a past medical history of palpitations, tobacco abuse, aortic atherosclerosis noted on CT imaging, quadriplegia  03/25/2022 CT of chest aortic atherosclerosis and emphysema noted 10/24/2021 Lexiscan  Normal, low risk  [facility] with [facility] for episodes of atypical chest pain, also palpitations.  He underwent a Lexi scan, he was started on low-dose beta-blocker.  Most recently he was evaluated by Dr. Lafayette Pierre on 07/12/2022, he was stable from a cardiac perspective, palpitations were not as bothersome anymore, no changes were made to plan of care and he was advised to follow-up in 9 months.   ROS: ROS   Studies Reviewed: .        Cardiac Studies & Procedures   ______________________________________________________________________________________________   STRESS TESTS  MYOCARDIAL PERFUSION IMAGING 10/24/2021  Narrative   The study is normal. The study is low risk.   Left ventricular function is normal. Nuclear stress EF: 61 %. The left ventricular ejection fraction is normal (55-65%). End diastolic cavity size is normal.   Prior study not available for comparison.            ______________________________________________________________________________________________      Risk Assessment/Calculations:   {Does this patient have ATRIAL FIBRILLATION?[phone]} No BP recorded.  {Refresh Note OR Click here to enter BP  :1}***       Physical Exam:   VS:  There were no vitals taken for this visit.   Wt Readings from Last 3 Encounters:  03/10/24 207 lb (93.9 kg)  01/06/24 205 lb (93 kg)  11/10/23 204 lb (92.5 kg)    GEN: Well  nourished, well developed in no acute distress NECK: No JVD; No carotid bruits CARDIAC: ***RRR, no murmurs, rubs, gallops RESPIRATORY:  Clear to auscultation without rales, wheezing or rhonchi  ABDOMEN: Soft, non-tender, non-distended EXTREMITIES:  No edema; No deformity   ASSESSMENT AND PLAN: .   ***    {Are you ordering a CV Procedure (e.g. stress test, cath, DCCV, TEE, etc)?   Press F2        :[id number]}  Dispo: ***  Signed, Terrance Ferretti, NP

## 2024-05-03 ENCOUNTER — Ambulatory Visit: Attending: Cardiology | Admitting: Cardiology

## 2024-05-03 DIAGNOSIS — G8252 Quadriplegia, C1-C4 incomplete: Secondary | ICD-10-CM

## 2024-05-03 DIAGNOSIS — M4722 Other spondylosis with radiculopathy, cervical region: Secondary | ICD-10-CM

## 2024-05-03 DIAGNOSIS — R002 Palpitations: Secondary | ICD-10-CM

## 2024-05-04 ENCOUNTER — Encounter: Payer: Self-pay | Admitting: Physical Medicine and Rehabilitation

## 2024-05-06 MED ORDER — DIAZEPAM 5 MG PO TABS: 2.5000 mg | ORAL_TABLET | Freq: Two times a day (BID) | ORAL | 5 refills | Status: DC

## 2024-05-10 ENCOUNTER — Encounter: Admitting: Registered Nurse

## 2024-05-10 ENCOUNTER — Telehealth: Payer: Self-pay | Admitting: Registered Nurse

## 2024-05-10 MED ORDER — OXYCODONE HCL 10 MG PO TABS: 5.0000 mg | ORAL_TABLET | ORAL | 0 refills | Status: DC | PRN

## 2024-05-10 MED ORDER — MORPHINE SULFATE ER 15 MG PO TBCR
30.0000 mg | EXTENDED_RELEASE_TABLET | Freq: Every morning | ORAL | 0 refills | Status: DC
Start: 2024-05-10 — End: 2024-05-25

## 2024-05-10 NOTE — Telephone Encounter (Signed)
 PMP was Reviewed.  MS Contin  and Oxycodone , e- scribed to pharmacy.  Victor Huang is aware of the above.

## 2024-05-10 NOTE — Telephone Encounter (Signed)
 Patient is not feel well and was rescheduled to 5/27, patient will need medication refill on morphine  (MS CONTIN ) 15 MG 12 hr tablet  ,Oxycodone  HCl 10 MG TABS  [facility] [address] , patient will need refill by 5/23 and would like to be contacted once completed

## 2024-05-13 ENCOUNTER — Encounter: Payer: Self-pay | Admitting: *Deleted

## 2024-05-21 ENCOUNTER — Telehealth: Payer: Self-pay

## 2024-05-21 NOTE — Telephone Encounter (Signed)
(  Key: [id number]) submitted Pregabalin

## 2024-05-25 ENCOUNTER — Other Ambulatory Visit: Payer: Self-pay | Admitting: Registered Nurse

## 2024-05-25 ENCOUNTER — Encounter: Payer: Self-pay | Admitting: Registered Nurse

## 2024-05-25 ENCOUNTER — Encounter: Attending: Registered Nurse | Admitting: Registered Nurse

## 2024-05-25 VITALS — BP 147/89 | HR 91 | Ht 69.0 in | Wt 205.0 lb

## 2024-05-25 DIAGNOSIS — G8250 Quadriplegia, unspecified: Secondary | ICD-10-CM | POA: Diagnosis present

## 2024-05-25 DIAGNOSIS — G894 Chronic pain syndrome: Secondary | ICD-10-CM | POA: Diagnosis present

## 2024-05-25 DIAGNOSIS — G904 Autonomic dysreflexia: Secondary | ICD-10-CM | POA: Diagnosis present

## 2024-05-25 DIAGNOSIS — Z5181 Encounter for therapeutic drug level monitoring: Secondary | ICD-10-CM | POA: Diagnosis present

## 2024-05-25 DIAGNOSIS — Z79891 Long term (current) use of opiate analgesic: Secondary | ICD-10-CM | POA: Insufficient documentation

## 2024-05-25 DIAGNOSIS — M542 Cervicalgia: Secondary | ICD-10-CM | POA: Diagnosis present

## 2024-05-25 MED ORDER — MORPHINE SULFATE ER 15 MG PO TBCR: 30.0000 mg | EXTENDED_RELEASE_TABLET | Freq: Every morning | ORAL | 0 refills | Status: DC

## 2024-05-25 MED ORDER — OXYCODONE HCL 10 MG PO TABS: 5.0000 mg | ORAL_TABLET | ORAL | 0 refills | Status: DC | PRN

## 2024-05-25 NOTE — Progress Notes (Signed)
 Subjective:    Patient ID: Victor Huang, male    DOB: June 01, 1976, 48 y.o.   MRN: [id number]  HPI: Victor Huang is a 48 y.o. male who returns for follow up appointment for chronic pain and medication refill. He states his pain is located inhis neck and upper back. Also reports he has a resolving headache, .He  rates his pain 5. His current exercise regime is walking and performing stretching exercises.  Victor Huang Morphine  equivalent is 120.00 MME.  He is also prescribed Diazepam  by Dr. Lovorn .We have discussed the black box warning of using opioids and benzodiazepines. I highlighted the dangers of using these drugs together and discussed the adverse events including respiratory suppression, overdose, cognitive impairment and importance of compliance with current regimen. We will continue to monitor and adjust as indicated.      UDS ordered today.      Pain Inventory Average Pain 4 Pain Right Now 5 My pain is intermittent, constant, sharp, burning, dull, stabbing, tingling, and aching  In the last 24 hours, has pain interfered with the following? General activity 10 Relation with others 10 Enjoyment of life 10 What TIME of day is your pain at its worst? morning , daytime, evening, night, and varies Sleep (in general) Poor  Pain is worse with: bending, sitting, standing, and some activites Pain improves with: rest and medication Relief from Meds: 8  Family History  Problem Relation Age of Onset   Hypertension Mother    Diabetes Mother    Cervical cancer Mother    Heart attack Father    Asthma Maternal Grandmother    Food Allergy Son    Seizures Son    Allergies Son    Colitis Son    GER disease Son    Irritable bowel syndrome Son    Asthma Son    Food Allergy Son    Allergies Son    Other Son        eosinophilic esophagitis   Colitis Son    GER disease Son    Asthma Son    Colon cancer Neg Hx    Esophageal cancer Neg Hx    Stomach cancer Neg Hx    Pancreatic  cancer Neg Hx    Colon polyps Neg Hx    Social History   Socioeconomic History   Marital status: Married    Spouse name: Not on file   Number of children: 2   Years of education: Not on file   Highest education level: Not on file  Occupational History   Not on file  Tobacco Use   Smoking status: Every Day    Current packs/day: 1.00    Average packs/day: 1 pack/day for 37.4 years (37.4 ttl pk-yrs)    Types: Cigarettes    Start date: 01/01/1987   Smokeless tobacco: Never  Vaping Use   Vaping status: Never Used  Substance and Sexual Activity   Alcohol use: Never    Comment: no beer in over 15 years   Drug use: Not Currently   Sexual activity: Yes  Other Topics Concern   Not on file  Social History Narrative   Not on file   Social Drivers of Health   Financial Resource Strain: Not on file  Food Insecurity: Not on file  Transportation Needs: Not on file  Physical Activity: Not on file  Stress: Not on file  Social Connections: Unknown (01/24/2023)   Received from [facility], [facility]   Social  Network    Social Network: Not on file   Past Surgical History:  Procedure Laterality Date   BIOPSY  04/08/2022   Procedure: BIOPSY;  Surgeon: Daina Drum, MD;  Location: [facility];  Service: Gastroenterology;;   COLONOSCOPY WITH PROPOFOL  N/A 04/08/2022   Procedure: COLONOSCOPY WITH PROPOFOL ;  Surgeon: Daina Drum, MD;  Location: [facility];  Service: Gastroenterology;  Laterality: N/A;   ESOPHAGOGASTRODUODENOSCOPY (EGD) WITH PROPOFOL  N/A 04/08/2022   Procedure: ESOPHAGOGASTRODUODENOSCOPY (EGD) WITH PROPOFOL ;  Surgeon: Daina Drum, MD;  Location: [facility];  Service: Gastroenterology;  Laterality: N/A;   POLYPECTOMY  04/08/2022   Procedure: POLYPECTOMY;  Surgeon: Daina Drum, MD;  Location: [facility];  Service: Gastroenterology;;   POSTERIOR CERVICAL FUSION/FORAMINOTOMY N/A 06/18/2021   Procedure: POSTERIOR CERVICAL FUSION CERVICAL FIVE THROUGH SIX WITH  TRIPLE WIRE TECHNIQUE FIXATION AND RIGHT ILIAC CREST BONE GRAFT HARVEST, LEFT CERVIACL SIX THROUGH SEVEN CERVICAL FORAMINAL LAMINOPLASTY;  Surgeon: Alphonso Jean, MD;  Location: [facility];  Service: Orthopedics;  Laterality: N/A;   Past Surgical History:  Procedure Laterality Date   BIOPSY  04/08/2022   Procedure: BIOPSY;  Surgeon: Daina Drum, MD;  Location: [facility];  Service: Gastroenterology;;   COLONOSCOPY WITH PROPOFOL  N/A 04/08/2022   Procedure: COLONOSCOPY WITH PROPOFOL ;  Surgeon: Daina Drum, MD;  Location: [facility];  Service: Gastroenterology;  Laterality: N/A;   ESOPHAGOGASTRODUODENOSCOPY (EGD) WITH PROPOFOL  N/A 04/08/2022   Procedure: ESOPHAGOGASTRODUODENOSCOPY (EGD) WITH PROPOFOL ;  Surgeon: Daina Drum, MD;  Location: [facility];  Service: Gastroenterology;  Laterality: N/A;   POLYPECTOMY  04/08/2022   Procedure: POLYPECTOMY;  Surgeon: Daina Drum, MD;  Location: [facility];  Service: Gastroenterology;;   POSTERIOR CERVICAL FUSION/FORAMINOTOMY N/A 06/18/2021   Procedure: POSTERIOR CERVICAL FUSION CERVICAL FIVE THROUGH SIX WITH TRIPLE WIRE TECHNIQUE FIXATION AND RIGHT ILIAC CREST BONE GRAFT HARVEST, LEFT CERVIACL SIX THROUGH SEVEN CERVICAL FORAMINAL LAMINOPLASTY;  Surgeon: Alphonso Jean, MD;  Location: [facility];  Service: Orthopedics;  Laterality: N/A;   Past Medical History:  Diagnosis Date   Acute incomplete quadriplegia (HCC) 11/02/2021   Anxiety disorder    Aortic atherosclerosis (HCC)    Asthma    as a child   Autonomic dysreflexia    Cervical myelopathy (HCC) 06/22/2021   Chest pain of uncertain etiology 10/04/2021   Chronic pain syndrome 11/02/2021   Cigarette smoker 10/04/2021   Depression    Diverticulosis    Essential (primary) hypertension    GERD (gastroesophageal reflux disease)    Headache    Neurogenic bladder 06/17/2022   Neurogenic bowel 02/04/2022   Other spondylosis with radiculopathy, cervical region 06/18/2021   C6-7 left foramenal  stenosis    Palpitations 10/04/2021   Pneumonia    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/2021   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  Opioid Risk Score:   Fall Risk Score:  `1  Depression screen [facility] 2/9     03/10/2024    8:44 AM 08/05/2023    9:11 AM 04/23/2023    9:24 AM 10/18/2022    9:44 AM 06/17/2022    9:14 AM 02/04/2022    9:05 AM 11/02/2021    9:39 AM  Depression screen PHQ 2/9  Decreased Interest 1 0 1 1 3  0 0  Down, Depressed, Hopeless 1 0 1 1 3  0 3  PHQ - 2 Score 2 0 2 2 6  0 3  Altered sleeping       3  Tired, decreased energy       0  Change in appetite       0  Feeling bad or failure about yourself        0  Trouble concentrating       0  Moving slowly or fidgety/restless       0  Suicidal thoughts       0  PHQ-9 Score       6    Review of Systems  Musculoskeletal:  Positive for back pain and neck pain.  Neurological:  Positive for headaches.  All other systems reviewed and are negative.      Objective:   Physical Exam Vitals and nursing note reviewed.  Constitutional:      Appearance: Normal appearance.  Neck:     Comments: Cervical Paraspinal Tenderness: C-5-C-6 Cardiovascular:     Rate and Rhythm: Normal rate and regular rhythm.     Pulses: Normal pulses.     Heart sounds: Normal heart sounds.  Pulmonary:     Effort: Pulmonary effort is normal.     Breath sounds: Normal breath sounds.  Musculoskeletal:     Comments: Normal Muscle Bulk and Muscle Testing Reveals:  Upper Extremities: Decreased ROM 90 Degrees and Muscle Strength 5/5 Right Greater Trochanter Tenderness Lower Extremities: Full ROM and Muscle Strength 5/5 Arises from Table with ease Narrow Based  Gait     Skin:    General: Skin is warm and dry.  Neurological:     Mental Status: He is alert and oriented to person, place, and time.  Psychiatric:        Mood and  Affect: Mood normal.        Behavior: Behavior normal.         Assessment & Plan:  Acute Incomplete Quadriplegia: Continue with HEP as tolerated. Continue to Monitor. 05/25/2024 Autonomic Dysreflexia: Cannot Stand for more than 10- 20 minutes Continue current medication regimen. Continue to monitor. 05/25/2024 Cervicalgia/ Cervical Radiculitis:Referral Place to Neurosurgery, this was discussed with Dr Raynaldo Call  Continue Pregabalin . Continue to Monitor. 05/25/2024 Chronic Pain Syndrome: Refilled: MS Contin  ER 15 mg take 2  tablets in the morning and one tablet in the evening #90 and Oxycodone  10 mg one tablet every 4 hours as needed for pain #150. Second script sent for the following moth. We will continue the opioid monitoring program, this consists of regular clinic visits, examinations, urine drug screen, pill counts as well as use of [place]  Controlled Substance Reporting system. A 12 month History has been reviewed on the [place]  Controlled Substance Reporting System on 05/25/2024   F/U with Dr Raynaldo Call in 2 months.

## 2024-05-28 LAB — TOXASSURE SELECT,+ANTIDEPR,UR

## 2024-06-04 ENCOUNTER — Other Ambulatory Visit: Payer: Self-pay | Admitting: Internal Medicine

## 2024-06-07 ENCOUNTER — Other Ambulatory Visit: Payer: Self-pay | Admitting: Physical Medicine and Rehabilitation

## 2024-06-16 ENCOUNTER — Other Ambulatory Visit: Payer: Self-pay | Admitting: Physical Medicine and Rehabilitation

## 2024-06-27 ENCOUNTER — Other Ambulatory Visit: Payer: Self-pay | Admitting: Internal Medicine

## 2024-06-29 LAB — LAB REPORT - SCANNED: EGFR: 114

## 2024-07-16 ENCOUNTER — Telehealth: Payer: Self-pay

## 2024-07-16 MED ORDER — MORPHINE SULFATE ER 15 MG PO TBCR
30.0000 mg | EXTENDED_RELEASE_TABLET | Freq: Every morning | ORAL | 0 refills | Status: DC
Start: 2024-07-16 — End: 2024-07-23

## 2024-07-16 MED ORDER — OXYCODONE HCL 10 MG PO TABS
5.0000 mg | ORAL_TABLET | ORAL | 0 refills | Status: DC | PRN
Start: 2024-07-16 — End: 2024-08-13

## 2024-07-16 NOTE — Telephone Encounter (Signed)
 [facility] they have a new rule in place that and all controlled medications require diagnosis codes on the hardcopy of the rx.  They have rejected this patients rx and have requested a new one rx with diagnosis codes and if it is chronic pain they want to know the diagnosis codes that are causing the chronic pain. Please resend the rx for Morphine  (MS Contin ) 15mg  12 hour tablet with diagnosis codes (ICD-10 codes) listed on rx to patients pharmacy.

## 2024-07-23 ENCOUNTER — Encounter: Attending: Registered Nurse | Admitting: Physical Medicine and Rehabilitation

## 2024-07-23 ENCOUNTER — Encounter: Payer: Self-pay | Admitting: Physical Medicine and Rehabilitation

## 2024-07-23 VITALS — BP 131/88 | HR 88 | Ht 69.0 in | Wt 203.0 lb

## 2024-07-23 DIAGNOSIS — G894 Chronic pain syndrome: Secondary | ICD-10-CM | POA: Insufficient documentation

## 2024-07-23 DIAGNOSIS — G8250 Quadriplegia, unspecified: Secondary | ICD-10-CM | POA: Diagnosis present

## 2024-07-23 DIAGNOSIS — F458 Other somatoform disorders: Secondary | ICD-10-CM | POA: Diagnosis present

## 2024-07-23 DIAGNOSIS — G904 Autonomic dysreflexia: Secondary | ICD-10-CM | POA: Insufficient documentation

## 2024-07-23 DIAGNOSIS — R252 Cramp and spasm: Secondary | ICD-10-CM | POA: Diagnosis present

## 2024-07-23 DIAGNOSIS — M792 Neuralgia and neuritis, unspecified: Secondary | ICD-10-CM | POA: Diagnosis present

## 2024-07-23 MED ORDER — NITROGLYCERIN NICU 2% OINTMENT: 1.0000 | TOPICAL_OINTMENT | Freq: Two times a day (BID) | TRANSDERMAL | 5 refills | Status: AC | PRN

## 2024-07-23 MED ORDER — MORPHINE SULFATE ER 15 MG PO TBCR: EXTENDED_RELEASE_TABLET | ORAL | 0 refills | Status: DC

## 2024-07-23 MED ORDER — LIDOCAINE 5 % EX OINT: 1.0000 | TOPICAL_OINTMENT | Freq: Four times a day (QID) | CUTANEOUS | 5 refills | Status: AC | PRN

## 2024-07-23 MED ORDER — PREGABALIN 150 MG PO CAPS: 150.0000 mg | ORAL_CAPSULE | Freq: Four times a day (QID) | ORAL | 5 refills | Status: DC

## 2024-07-23 MED ORDER — MORPHINE SULFATE ER 15 MG PO TBCR: 30.0000 mg | EXTENDED_RELEASE_TABLET | Freq: Every morning | ORAL | 0 refills | Status: DC

## 2024-07-23 NOTE — Progress Notes (Signed)
 Subjective:    Patient ID: Victor Huang, male    DOB: 09-Apr-1976, 48 y.o.   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  He now has chronic nerve and chronic pain since  surgery as well. 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  Is a C2 R hemi/SCI/quadriplegia- ASIA D. Now depressed with sleep issues as a result and neurogenic bowel/incontinence. Also has fibromyalgia-    Just dx'd with severe COPD and probable pulmonary fibrosis   Here for f/u on SCI.   Things OK.   Valium  has helped for prevention of AD a LOT. Also helps him go to bathroom- also helped with causing looser stools. Which helps.   AD actually occurring 1-2x/week now- vs before was 1-2x/day.   Episodes- appear more intense, but could be having less often.  Checks BP when this occurs- BP was 198/103- for a couple of hours. Terrified of NTG-  has tried Metoprolol  XR in past and scared him-  felt would fall asleep for 2 days.   Feels like lungs are giving out- seeing Pulmonary- at night esp-  sounds like Drowning and coughing a lot at night- it will wake him up- and then goes on for hours.    Still smoking- 5 cigarettes/day- was smoking prior 1ppd.   Sitting in recliner a lot- sitting 45-60 degrees at most and in recliner 90+% of time.   Fine motor skills are deteriorating- struggling with writing.  Since Not using muscles.   Tries to use hands as much as he can.  Sits in chair almost all the time.   Movements with arms, causes  AD   Having more Pruritus-neuropathic itching- occurs daily.  Itching all over back  Feels like RLE is shorter than LLE. More balance issues.     Has lidocaine  ointment- uses for neck pain- but not for itching.    Does walking 1-2 laps- 1/2 acre - at least 2x/day- sometimes 5x- depending on feeling  bad and wheezing afterwards- going to mailbox makes him wheezing. .   Skin feels bruised as well- when touches body.   GP is lowering Elavil  by 1/2- because of munchies at night.   Not eating much- gets munchy after night meds.   Triglycerides WAY up- but appetite at night real struggle.    Noticing auditory changes- hearing conversations of people not there. Driving him crazy.     Pain Inventory Average Pain 4 Pain Right Now 4 My pain is constant, sharp, burning, dull, stabbing, tingling, and aching  In the last 24 hours, has pain interfered with the following? General activity 10 Relation with others 6 Enjoyment of life 8 What TIME of day is your pain at its worst? night Sleep (in general) Good  Pain is worse with: walking, bending, sitting, standing, and some activites Pain improves with: rest, pacing activities, and medication Relief from Meds: 8  Family History  Problem Relation Age of Onset   Hypertension Mother    Diabetes Mother    Cervical cancer Mother    Heart attack Father    Asthma Maternal Grandmother    Food Allergy Son    Seizures Son    Allergies Son    Colitis Son    GER disease Son    Irritable bowel syndrome  Son    Asthma Son    Food Allergy Son    Allergies Son    Other Son        eosinophilic esophagitis   Colitis Son    GER disease Son    Asthma Son    Colon cancer Neg Hx    Esophageal cancer Neg Hx    Stomach cancer Neg Hx    Pancreatic cancer Neg Hx    Colon polyps Neg Hx    Social History   Socioeconomic History   Marital status: Married    Spouse name: Not on file   Number of children: 2   Years of education: Not on file   Highest education level: Not on file  Occupational History   Not on file  Tobacco Use   Smoking status: Every Day    Current packs/day: 1.00    Average packs/day: 1 pack/day for 37.6 years (37.6 ttl pk-yrs)    Types: Cigarettes    Start date: 01/01/1987   Smokeless tobacco: Never  Vaping Use   Vaping  status: Never Used  Substance and Sexual Activity   Alcohol use: Never    Comment: no beer in over 15 years   Drug use: Not Currently   Sexual activity: Yes  Other Topics Concern   Not on file  Social History Narrative   Not on file   Social Drivers of Health   Financial Resource Strain: Not on file  Food Insecurity: Not on file  Transportation Needs: Not on file  Physical Activity: Not on file  Stress: Not on file  Social Connections: Unknown (01/24/2023)   Received from [facility]   Social Network    Social Network: Not on file   Past Surgical History:  Procedure Laterality Date   BIOPSY  04/08/2022   Procedure: BIOPSY;  Surgeon: Federico Rosario BROCKS, MD;  Location: [facility];  Service: Gastroenterology;;   COLONOSCOPY WITH PROPOFOL  N/A 04/08/2022   Procedure: COLONOSCOPY WITH PROPOFOL ;  Surgeon: Federico Rosario BROCKS, MD;  Location: [facility];  Service: Gastroenterology;  Laterality: N/A;   ESOPHAGOGASTRODUODENOSCOPY (EGD) WITH PROPOFOL  N/A 04/08/2022   Procedure: ESOPHAGOGASTRODUODENOSCOPY (EGD) WITH PROPOFOL ;  Surgeon: Federico Rosario BROCKS, MD;  Location: [facility];  Service: Gastroenterology;  Laterality: N/A;   POLYPECTOMY  04/08/2022   Procedure: POLYPECTOMY;  Surgeon: Federico Rosario BROCKS, MD;  Location: [facility];  Service: Gastroenterology;;   POSTERIOR CERVICAL FUSION/FORAMINOTOMY N/A 06/18/2021   Procedure: POSTERIOR CERVICAL FUSION CERVICAL FIVE THROUGH SIX WITH TRIPLE WIRE TECHNIQUE FIXATION AND RIGHT ILIAC CREST BONE GRAFT HARVEST, LEFT CERVIACL SIX THROUGH SEVEN CERVICAL FORAMINAL LAMINOPLASTY;  Surgeon: Lucilla Lynwood BRAVO, MD;  Location: [facility];  Service: Orthopedics;  Laterality: N/A;   Past Surgical History:  Procedure Laterality Date   BIOPSY  04/08/2022   Procedure: BIOPSY;  Surgeon: Federico Rosario BROCKS, MD;  Location: [facility];  Service: Gastroenterology;;   COLONOSCOPY WITH PROPOFOL  N/A 04/08/2022   Procedure: COLONOSCOPY WITH PROPOFOL ;  Surgeon: Federico Rosario BROCKS, MD;   Location: [facility];  Service: Gastroenterology;  Laterality: N/A;   ESOPHAGOGASTRODUODENOSCOPY (EGD) WITH PROPOFOL  N/A 04/08/2022   Procedure: ESOPHAGOGASTRODUODENOSCOPY (EGD) WITH PROPOFOL ;  Surgeon: Federico Rosario BROCKS, MD;  Location: [facility];  Service: Gastroenterology;  Laterality: N/A;   POLYPECTOMY  04/08/2022   Procedure: POLYPECTOMY;  Surgeon: Federico Rosario BROCKS, MD;  Location: [facility];  Service: Gastroenterology;;   POSTERIOR CERVICAL FUSION/FORAMINOTOMY N/A 06/18/2021   Procedure: POSTERIOR CERVICAL FUSION CERVICAL FIVE THROUGH SIX WITH TRIPLE WIRE TECHNIQUE FIXATION AND RIGHT  ILIAC CREST BONE GRAFT HARVEST, LEFT CERVIACL SIX THROUGH SEVEN CERVICAL FORAMINAL LAMINOPLASTY;  Surgeon: Lucilla Lynwood BRAVO, MD;  Location: [facility];  Service: Orthopedics;  Laterality: N/A;   Past Medical History:  Diagnosis Date   Acute incomplete quadriplegia (HCC) 11/02/2021   Anxiety disorder    Aortic atherosclerosis (HCC)    Asthma    as a child   Autonomic dysreflexia    Cervical myelopathy (HCC) 06/22/2021   Chest pain of uncertain etiology 10/04/2021   Chronic pain syndrome 11/02/2021   Cigarette smoker 10/04/2021   Depression    Diverticulosis    Essential (primary) hypertension    GERD (gastroesophageal reflux disease)    Headache    Neurogenic bladder 06/17/2022   Neurogenic bowel 02/04/2022   Other spondylosis with radiculopathy, cervical region 06/18/2021   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    Palpitations 10/04/2021   Pneumonia    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/2021   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   Spasticity 11/02/2021   Status post cervical spinal fusion 06/18/2021   BP 131/88   Pulse 88   Ht 5' 9 (1.753 m)   Wt 203 lb (92.1 kg)   SpO2 97%   BMI 29.98 kg/m   Opioid Risk Score:   Fall Risk Score:  `1  Depression screen [facility] 2/9     07/23/2024    9:17 AM 05/25/2024   12:56 PM 03/10/2024    8:44 AM 08/05/2023    9:11 AM 04/23/2023    9:24 AM 10/18/2022    9:44 AM  06/17/2022    9:14 AM  Depression screen PHQ 2/9  Decreased Interest 1 1 1  0 1 1 3   Down, Depressed, Hopeless 1 1 1  0 1 1 3   PHQ - 2 Score 2 2 2  0 2 2 6     Review of Systems  Musculoskeletal:  Positive for back pain and neck pain.       Pain in both shoulders, arms & hands  All other systems reviewed and are negative.      Objective:   Physical Exam  Awake, alert, appropriate, a little anxious; accompanied by wife, NAD No assistive device  MSK: UEs 5-/5 throughout except FA 4+/5 B/L Fine motor skills slowed   Neuro: Decreased balance- with feet together and eyes open; worse with eyes closed and when challenges him with balance.       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  He now has chronic nerve and chronic pain since  surgery as well. 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  Is a C2 R hemi/SCI/quadriplegia- ASIA D. Now depressed with sleep issues as a result and neurogenic bowel/incontinence. Also has fibromyalgia-    Just dx'd with severe COPD and probable pulmonary fibrosis   Here for f/u on SCI.  Let's try Nitropaste- to treat his AD-  1 application and cover with saran wrap- put in place you can see- - and take off when Sx's have resolved.   2.  Last MRI done done 2-3 months ago with Dr Gillie- looked good- did just the neck. Can you get the report to me for the Cervical MRI.   3.   Some of weakness and fine motor issues CAN be from Disuse- but also could be for something that's  compressing the Spinal cord.   4. Try Lidocaine  ointment on back- up to 4x/day. For neuropathic itching.  Increase to 150 g to use QID.    5. Con't Valium  for AD prevention.  2.5 to 5 mg 2x/day.  Last refill 05/06/24- con't   6.  Con't MS Contin  30 mg in AM and 15 mg nightly- needs refills.  -and  Oxycodone  10 mg -will refill - just filled meds so will give 2 months  7.  Need to look at cervical MRI to see if things progressing- don't have ot look at- so bring it to me- might need to get brain MRI or thoracic MRI.   8.   Double check if has had his Liver functions checked- CMP is the lab I'm looking for- if it's hasn't been done, call me and I will order to be [facility]- doesn't have to be here at this office.   9. Don't use Nitro with Viagra - Sidenafil- within a 24 hour period. Can cause a severe drop in BP!!!  10.F/U in 3 months- double appointment- SCI and and Eunice at 6 weeks.   11. Last UDS was done 05/25/24- isn't due per clinic policy.   12. Concerned about syrinx causing worsening Sx's. Or something brain related.   I spent a total of 42   minutes on total care today- >50% coordination of care- due to f/u on many issues- as detailed above-

## 2024-07-23 NOTE — Patient Instructions (Signed)
 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  He now has chronic nerve and chronic pain since  surgery as well. 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  Is a C2 R hemi/SCI/quadriplegia- ASIA D. Now depressed with sleep issues as a result and neurogenic bowel/incontinence. Also has fibromyalgia-    Just dx'd with severe COPD and probable pulmonary fibrosis   Here for f/u on SCI.  Let's try Nitropaste- to treat his AD-  1 application and cover with saran wrap- put in place you can see- - and take off when Sx's have resolved.   2.  Last MRI done done 2-3 months ago with Dr Gillie- looked good- did just the neck. Can you get the report to me for the Cervical MRI.   3.   Some of weakness and fine motor issues CAN be from Disuse- but also could be for something that's compressing the Spinal cord.   4. Try Lidocaine  ointment on back- up to 4x/day. For neuropathic itching.  Increase to 150 g to use QID.    5. Con't Valium  for AD prevention.  2.5 to 5 mg 2x/day.  Last refill 05/06/24- con't   6.  Con't MS Contin  30 mg in AM and 15 mg nightly- needs refills.  -and Oxycodone  10 mg -will refill - just filled meds so will give 2 months  7.  Need to look at cervical MRI to see if things progressing- don't have ot look at- so bring it to me- might need to get brain MRI or thoracic MRI.   8.   Double check if has had his Liver functions checked- CMP is the lab I'm looking for- if it's hasn't been done, call me and I will order to be done at [facility]- doesn't have to be here at this office.   9. Don't use Nitro with Viagra - Sidenafil- within a 24 hour period. Can cause a severe drop in BP!!!  10.F/U in 3 months- double appointment- SCI and and Eunice at 6 weeks.

## 2024-07-24 ENCOUNTER — Encounter: Payer: Self-pay | Admitting: Physical Medicine and Rehabilitation

## 2024-07-29 ENCOUNTER — Other Ambulatory Visit: Payer: Self-pay | Admitting: Physical Medicine and Rehabilitation

## 2024-07-30 ENCOUNTER — Telehealth: Payer: Self-pay

## 2024-07-30 MED ORDER — DIAZEPAM 5 MG PO TABS: 2.5000 mg | ORAL_TABLET | Freq: Two times a day (BID) | ORAL | 5 refills | Status: DC

## 2024-07-30 NOTE — Telephone Encounter (Signed)
 The pharmacy has  cancelled the Diazepam  5 MG. The Rx will need to be resubmitted with a diagnosis code attached. Please advise or submit.   Thank you.

## 2024-08-11 ENCOUNTER — Other Ambulatory Visit: Payer: Self-pay | Admitting: Physical Medicine and Rehabilitation

## 2024-08-13 ENCOUNTER — Encounter: Payer: Self-pay | Admitting: Physical Medicine and Rehabilitation

## 2024-08-13 MED ORDER — OXYCODONE HCL 10 MG PO TABS: 5.0000 mg | ORAL_TABLET | ORAL | 0 refills | Status: DC | PRN

## 2024-08-13 MED ORDER — MORPHINE SULFATE ER 15 MG PO TBCR: 30.0000 mg | EXTENDED_RELEASE_TABLET | Freq: Every morning | ORAL | 0 refills | Status: DC

## 2024-09-03 ENCOUNTER — Encounter: Attending: Registered Nurse | Admitting: Registered Nurse

## 2024-09-03 VITALS — BP 145/89 | HR 85 | Ht 69.0 in | Wt 204.0 lb

## 2024-09-03 DIAGNOSIS — Z79891 Long term (current) use of opiate analgesic: Secondary | ICD-10-CM | POA: Insufficient documentation

## 2024-09-03 DIAGNOSIS — G8250 Quadriplegia, unspecified: Secondary | ICD-10-CM | POA: Insufficient documentation

## 2024-09-03 DIAGNOSIS — G894 Chronic pain syndrome: Secondary | ICD-10-CM | POA: Diagnosis not present

## 2024-09-03 DIAGNOSIS — G904 Autonomic dysreflexia: Secondary | ICD-10-CM | POA: Diagnosis not present

## 2024-09-03 DIAGNOSIS — Z5181 Encounter for therapeutic drug level monitoring: Secondary | ICD-10-CM | POA: Diagnosis not present

## 2024-09-03 MED ORDER — MORPHINE SULFATE ER 15 MG PO TBCR: 30.0000 mg | EXTENDED_RELEASE_TABLET | Freq: Every morning | ORAL | 0 refills | Status: DC

## 2024-09-03 MED ORDER — MORPHINE SULFATE ER 15 MG PO TBCR
EXTENDED_RELEASE_TABLET | ORAL | 0 refills | Status: DC
Start: 2024-09-03 — End: 2024-10-25

## 2024-09-03 MED ORDER — OXYCODONE HCL 10 MG PO TABS: 5.0000 mg | ORAL_TABLET | ORAL | 0 refills | Status: DC | PRN

## 2024-09-03 MED ORDER — PREGABALIN 150 MG PO CAPS: 150.0000 mg | ORAL_CAPSULE | Freq: Four times a day (QID) | ORAL | 5 refills | Status: DC

## 2024-09-03 NOTE — Progress Notes (Signed)
 Subjective:    Patient ID: Victor Huang, male    DOB: 1976/06/25, 48 y.o.   MRN: [id number]  HPI: Victor Huang is a 48 y.o. male whose appointment was changed to virtual visit, he called the office. He reports he was walking his dogs when he slipped on wet grass and fell forward, landed on hands and knees. His wife heled him up. He was educated on  returns for follow up appointment for chronic pain and medication refill. He was educated on falls prevention, he verbalizes understanding.   I connected with Victor Huang by a video enabled telemedicine application and verified that I am speaking with the correct person using two identifiers.  Location: Patient: In his home Provider: In the office   I discussed the limitations of evaluation and management by telemedicine and the availability of in person appointments. The patient expressed understanding and agreed to proceed.  He states his pain is located in his upper- mid back. He  rates his pain 7. His current exercise regimen since the fall is performing stretching exercises.  Mr, Finigan Morphine  equivalent is 120.00 MME.   He is also prescribed Diazepam  by Dr Cornelio .We have discussed the black box warning of using opioids and benzodiazepines. I highlighted the dangers of using these drugs together and discussed the adverse events including respiratory suppression, overdose, cognitive impairment and importance of compliance with current regimen. We will continue to monitor and adjust as indicated.    Last UDS was Performed on 05/25/2024, it was consistent    Pain Inventory Average Pain 7 Pain Right Now 7 My pain is sharp, stabbing, tingling, and aching  In the last 24 hours, has pain interfered with the following? General activity 8 Relation with others 0 Enjoyment of life 8 What TIME of day is your pain at its worst? varies Sleep (in general) Poor  Pain is worse with: walking, bending, sitting, standing, and some  activites Pain improves with: rest and medication Relief from Meds: 7  Family History  Problem Relation Age of Onset   Hypertension Mother    Diabetes Mother    Cervical cancer Mother    Heart attack Father    Asthma Maternal Grandmother    Food Allergy Son    Seizures Son    Allergies Son    Colitis Son    GER disease Son    Irritable bowel syndrome Son    Asthma Son    Food Allergy Son    Allergies Son    Other Son        eosinophilic esophagitis   Colitis Son    GER disease Son    Asthma Son    Colon cancer Neg Hx    Esophageal cancer Neg Hx    Stomach cancer Neg Hx    Pancreatic cancer Neg Hx    Colon polyps Neg Hx    Social History   Socioeconomic History   Marital status: Married    Spouse name: Not on file   Number of children: 2   Years of education: Not on file   Highest education level: Not on file  Occupational History   Not on file  Tobacco Use   Smoking status: Every Day    Current packs/day: 1.00    Average packs/day: 1 pack/day for 37.7 years (37.7 ttl pk-yrs)    Types: Cigarettes    Start date: 01/01/1987   Smokeless tobacco: Never  Vaping Use   Vaping status: Never  Used  Substance and Sexual Activity   Alcohol use: Never    Comment: no beer in over 15 years   Drug use: Not Currently   Sexual activity: Yes  Other Topics Concern   Not on file  Social History Narrative   Not on file   Social Drivers of Health   Financial Resource Strain: Not on file  Food Insecurity: Not on file  Transportation Needs: Not on file  Physical Activity: Not on file  Stress: Not on file  Social Connections: Unknown (01/24/2023)   [facility]   Social Network    Social Network: Not on file   Past Surgical History:  Procedure Laterality Date   BIOPSY  04/08/2022   Procedure: BIOPSY;  Surgeon: Federico Rosario BROCKS, MD;  Location: [facility];  Service: Gastroenterology;;   COLONOSCOPY WITH PROPOFOL  N/A 04/08/2022   Procedure: COLONOSCOPY WITH  PROPOFOL ;  Surgeon: Federico Rosario BROCKS, MD;  Location: [facility];  Service: Gastroenterology;  Laterality: N/A;   ESOPHAGOGASTRODUODENOSCOPY (EGD) WITH PROPOFOL  N/A 04/08/2022   Procedure: ESOPHAGOGASTRODUODENOSCOPY (EGD) WITH PROPOFOL ;  Surgeon: Federico Rosario BROCKS, MD;  Location: [facility];  Service: Gastroenterology;  Laterality: N/A;   POLYPECTOMY  04/08/2022   Procedure: POLYPECTOMY;  Surgeon: Federico Rosario BROCKS, MD;  Location: [facility];  Service: Gastroenterology;;   POSTERIOR CERVICAL FUSION/FORAMINOTOMY N/A 06/18/2021   Procedure: POSTERIOR CERVICAL FUSION CERVICAL FIVE THROUGH SIX WITH TRIPLE WIRE TECHNIQUE FIXATION AND RIGHT ILIAC CREST BONE GRAFT HARVEST, LEFT CERVIACL SIX THROUGH SEVEN CERVICAL FORAMINAL LAMINOPLASTY;  Surgeon: Lucilla Lynwood BRAVO, MD;  Location[facility];  Service: Orthopedics;  Laterality: N/A;   Past Surgical History:  Procedure Laterality Date   BIOPSY  04/08/2022   Procedure: BIOPSY;  Surgeon: Federico Rosario BROCKS, MD;  Location: [facility];  Service: Gastroenterology;;   COLONOSCOPY WITH PROPOFOL  N/A 04/08/2022   Procedure: COLONOSCOPY WITH PROPOFOL ;  Surgeon: Federico Rosario BROCKS, MD;  Location: [facility];  Service: Gastroenterology;  Laterality: N/A;   ESOPHAGOGASTRODUODENOSCOPY (EGD) WITH PROPOFOL  N/A 04/08/2022   Procedure: ESOPHAGOGASTRODUODENOSCOPY (EGD) WITH PROPOFOL ;  Surgeon: Federico Rosario BROCKS, MD;  Location: [facility];  Service: Gastroenterology;  Laterality: N/A;   POLYPECTOMY  04/08/2022   Procedure: POLYPECTOMY;  Surgeon: Federico Rosario BROCKS, MD;  Location: [facility];  Service: Gastroenterology;;   POSTERIOR CERVICAL FUSION/FORAMINOTOMY N/A 06/18/2021   Procedure: POSTERIOR CERVICAL FUSION CERVICAL FIVE THROUGH SIX WITH TRIPLE WIRE TECHNIQUE FIXATION AND RIGHT ILIAC CREST BONE GRAFT HARVEST, LEFT CERVIACL SIX THROUGH SEVEN CERVICAL FORAMINAL LAMINOPLASTY;  Surgeon: Lucilla Lynwood BRAVO, MD;  Location: [facility];  Service: Orthopedics;  Laterality: N/A;   Past Medical History:   Diagnosis Date   Acute incomplete quadriplegia (HCC) 11/02/2021   Anxiety disorder    Aortic atherosclerosis (HCC)    Asthma    as a child   Autonomic dysreflexia    Cervical myelopathy (HCC) 06/22/2021   Chest pain of uncertain etiology 10/04/2021   Chronic pain syndrome 11/02/2021   Cigarette smoker 10/04/2021   Depression    Diverticulosis    Essential (primary) hypertension    GERD (gastroesophageal reflux disease)    Headache    Neurogenic bladder 06/17/2022   Neurogenic bowel 02/04/2022   Other spondylosis with radiculopathy, cervical region 06/18/2021   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  Opioid Risk Score:   Fall Risk Score:  `1  Depression screen [facility] 2/9     07/23/2024    9:17 AM 05/25/2024   12:56 PM 03/10/2024    8:44 AM 08/05/2023    9:11 AM 04/23/2023    9:24 AM 10/18/2022    9:44 AM 06/17/2022    9:14 AM  Depression screen PHQ 2/9  Decreased Interest 1 1 1  0 1 1 3   Down, Depressed, Hopeless 1 1 1  0 1 1 3   PHQ - 2 Score 2 2 2  0 2 2 6      Review of Systems  Musculoskeletal:  Positive for back pain and gait problem.  All other systems reviewed and are negative.      Objective:   Physical Exam Vitals and nursing note reviewed.  Musculoskeletal:     Comments: No Physical Exam Performed: Virtual Visit           Assessment & Plan:  Acute Incomplete Quadriplegia: Continue with HEP as tolerated. Continue to Monitor. 09/03/2024 Autonomic Dysreflexia: Cannot Stand for more than 10- 20 minutes Continue current medication regimen. Continue to monitor. 09/03/2024 Cervicalgia/ Cervical Radiculitis: Continue Pregabalin . Continue to Monitor. 09/03/2024 Chronic Pain Syndrome: Refilled: MS Contin  ER 15 mg take 2  tablets in the  morning and one tablet in the evening #90 and Oxycodone  10 mg one tablet every 4 hours as needed for pain #150. Second script sent for the following moth. We will continue the opioid monitoring program, this consists of regular clinic visits, examinations, urine drug screen, pill counts as well as use of [place]  Controlled Substance Reporting system. A 12 month History has been reviewed on the [place]  Controlled Substance Reporting System on 09/03/2024   F/U with Dr Lovorn in 1 month.

## 2024-09-09 ENCOUNTER — Encounter: Payer: Self-pay | Admitting: Registered Nurse

## 2024-09-24 ENCOUNTER — Telehealth: Payer: Self-pay | Admitting: Registered Nurse

## 2024-09-24 DIAGNOSIS — M792 Neuralgia and neuritis, unspecified: Secondary | ICD-10-CM

## 2024-09-24 DIAGNOSIS — M5412 Radiculopathy, cervical region: Secondary | ICD-10-CM

## 2024-09-24 MED ORDER — PREGABALIN 150 MG PO CAPS: 150.0000 mg | ORAL_CAPSULE | Freq: Four times a day (QID) | ORAL | 5 refills | Status: AC

## 2024-09-24 NOTE — Telephone Encounter (Signed)
PMP was Reviewed.  Pregabalin e-scribed to pharmacy.

## 2024-09-24 NOTE — Telephone Encounter (Signed)
 Pt's wife Odella LVM 9/25 to inform us  that the pt's pharmacy bounced his rx for pregabalin  (LYRICA ) 150 MG capsule. They are requesting an updated prescription with a dx code added. Pharmacy is [id number] - [place], [place] - [address]

## 2024-10-25 ENCOUNTER — Encounter: Attending: Registered Nurse | Admitting: Physical Medicine and Rehabilitation

## 2024-10-25 ENCOUNTER — Encounter: Payer: Self-pay | Admitting: Physical Medicine and Rehabilitation

## 2024-10-25 VITALS — BP 138/90 | HR 88 | Ht 69.0 in | Wt 205.0 lb

## 2024-10-25 DIAGNOSIS — G8250 Quadriplegia, unspecified: Secondary | ICD-10-CM | POA: Insufficient documentation

## 2024-10-25 DIAGNOSIS — G904 Autonomic dysreflexia: Secondary | ICD-10-CM | POA: Insufficient documentation

## 2024-10-25 DIAGNOSIS — G894 Chronic pain syndrome: Secondary | ICD-10-CM | POA: Diagnosis not present

## 2024-10-25 DIAGNOSIS — Z79891 Long term (current) use of opiate analgesic: Secondary | ICD-10-CM | POA: Insufficient documentation

## 2024-10-25 DIAGNOSIS — Z5181 Encounter for therapeutic drug level monitoring: Secondary | ICD-10-CM | POA: Diagnosis present

## 2024-10-25 MED ORDER — OXYCODONE HCL 10 MG PO TABS: 5.0000 mg | ORAL_TABLET | ORAL | 0 refills | Status: DC | PRN

## 2024-10-25 MED ORDER — DULOXETINE HCL 60 MG PO CPEP: 120.0000 mg | ORAL_CAPSULE | Freq: Every day | ORAL | 1 refills | Status: AC

## 2024-10-25 MED ORDER — LINZESS 290 MCG PO CAPS: 290.0000 ug | ORAL_CAPSULE | Freq: Every day | ORAL | 1 refills | Status: AC

## 2024-10-25 MED ORDER — DANTROLENE SODIUM 100 MG PO CAPS: 100.0000 mg | ORAL_CAPSULE | Freq: Three times a day (TID) | ORAL | 5 refills | Status: AC

## 2024-10-25 MED ORDER — MORPHINE SULFATE ER 15 MG PO TBCR: 30.0000 mg | EXTENDED_RELEASE_TABLET | Freq: Every morning | ORAL | 0 refills | Status: AC

## 2024-10-25 MED ORDER — OXYCODONE HCL 10 MG PO TABS: 5.0000 mg | ORAL_TABLET | ORAL | 0 refills | Status: AC | PRN

## 2024-10-25 MED ORDER — MORPHINE SULFATE ER 15 MG PO TBCR: EXTENDED_RELEASE_TABLET | ORAL | 0 refills | Status: DC

## 2024-10-25 NOTE — Patient Instructions (Signed)
 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  He now has chronic nerve and chronic pain since  surgery as well. 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  Is a C2 R hemi/SCI/quadriplegia- ASIA D. Now depressed with sleep issues as a result and neurogenic bowel/incontinence. Also has fibromyalgia- trauma induced.     Just dx'd with severe COPD and probable pulmonary fibrosis Here for f/u on SCI  Cannot do more trigger point injections- since caused more AD.  2.  Cannot tolerate touch very well- but really needs    3.   Can increase Duloxetine -  to 120 mg daily- for nerve pain and depression.   4.  Needs, it sounds to find another Pulmonary doctor- - because not feeling listened to- don't have another idea, unfortunately.    5.  Will need to do LFTs at next appt with BMP- so a CMP.    6.  Con't Valium /Diazepam - takes 1/2 tab 4x/day usually-  Doesn't need refills.   7. Con't Linzess - 290 mcg daily.  For severe opioid induced constipation.    8. Trigger points- hold theracane-  2-4 minutes on the spots that are tight- add more pressure slowly-  the more pressure you can tolerate, the more the muscle will start to relax.  Youtube has some great videos-   9. Talk to Angeline Iba about his BP- check it more regularly, and if it continues to stay high, ask about BP medicine .   10. Needs to get Nitropaste. Last Rx 07/23/24.    11.  F/U in 3months- double appt- SCI

## 2024-10-25 NOTE — Progress Notes (Addendum)
 Subjective:    Patient ID: Victor Huang, male    DOB: 1976-08-26, 48 y.o.   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  He now has chronic nerve and chronic pain since  surgery as well. 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  Is a C2 R hemi/SCI/quadriplegia- ASIA D. Now depressed with sleep issues as a result and neurogenic bowel/incontinence. Also has fibromyalgia-    Just dx'd with severe COPD and probable pulmonary fibrosis Here for f/u on SCI    Did not get the nitropaste yet- used NTG pill - caused worse HA initially- but then brought it to a more tolerable level after that.   It took 2 days to get HA to go away.   BP was 183/117- so made him take NTG  That episode wasn't improving   On average episodes can last 1-2 hours at minimum  ( on average) to days.  Will come in bursts- Also had a HA- which could have been a migraine.   Getting new Sx's- starting of shoots of electricity and coming out through jaw and teeth.   Episode last night- nerves setting off.  Like chain reaction in shoulders- and than ran down into chest into fingers.   Have tried trP injections in past, but had more AD for 1 month after that.  So doesn't want to try again   Last MRI was in the last year.  5-6 months ago-    Doesn't like to be touched- even before this.  And is worse because pain.    Episode- feels electricity/shocks- then as soon as gets a big hit, heart will start going then ears get red and then gets HA.   BP higher occurrence- than he knows.   PCP tapering Elavil -  Trouble finding him a therapist. Someone to manage  meds.   Had to bring Elavil  back to 75 mg at bedtime- since PCP wanted to take it off to increase Cymbalta .     Pain Inventory Average Pain 4 Pain Right Now 3 My pain is  constant, sharp, burning, dull, stabbing, tingling, aching, and electric  In the last 24 hours, has pain interfered with the following? General activity 9 Relation with others 9 Enjoyment of life 10 What TIME of day is your pain at its worst? night Sleep (in general) Good  Pain is worse with: walking, bending, sitting, and standing Pain improves with: medication Relief from Meds: 8  Family History  Problem Relation Age of Onset   Hypertension Mother    Diabetes Mother    Cervical cancer Mother    Heart attack Father    Asthma Maternal Grandmother    Food Allergy Son    Seizures Son    Allergies Son    Colitis Son    GER disease Son    Irritable bowel syndrome Son    Asthma Son    Food Allergy Son    Allergies Son    Other Son        eosinophilic esophagitis   Colitis Son    GER disease Son    Asthma Son    Colon cancer Neg Hx    Esophageal cancer Neg Hx    Stomach cancer Neg Hx    Pancreatic cancer Neg Hx  Colon polyps Neg Hx    Social History   Socioeconomic History   Marital status: Married    Spouse name: Not on file   Number of children: 2   Years of education: Not on file   Highest education level: Not on file  Occupational History   Not on file  Tobacco Use   Smoking status: Every Day    Current packs/day: 1.00    Average packs/day: 1 pack/day for 37.8 years (37.8 ttl pk-yrs)    Types: Cigarettes    Start date: 01/01/1987   Smokeless tobacco: Never  Vaping Use   Vaping status: Never Used  Substance and Sexual Activity   Alcohol use: Never    Comment: no beer in over 15 years   Drug use: Not Currently   Sexual activity: Yes  Other Topics Concern   Not on file  Social History Narrative   Not on file   Social Drivers of Health   Financial Resource Strain: Not on file  Food Insecurity: Not on file  Transportation Needs: Not on file  Physical Activity: Not on file  Stress: Not on file  Social Connections: Unknown (01/24/2023)   Received  from [facility]   Social Network    Social Network: Not on file   Past Surgical History:  Procedure Laterality Date   BIOPSY  04/08/2022   Procedure: BIOPSY;  Surgeon: Federico Rosario BROCKS, MD;  Location: [facility];  Service: Gastroenterology;;   COLONOSCOPY WITH PROPOFOL  N/A 04/08/2022   Procedure: COLONOSCOPY WITH PROPOFOL ;  Surgeon: Federico Rosario BROCKS, MD;  Location: [facility];  Service: Gastroenterology;  Laterality: N/A;   ESOPHAGOGASTRODUODENOSCOPY (EGD) WITH PROPOFOL  N/A 04/08/2022   Procedure: ESOPHAGOGASTRODUODENOSCOPY (EGD) WITH PROPOFOL ;  Surgeon: Federico Rosario BROCKS, MD;  Location: [facility];  Service: Gastroenterology;  Laterality: N/A;   POLYPECTOMY  04/08/2022   Procedure: POLYPECTOMY;  Surgeon: Federico Rosario BROCKS, MD;  Location: [facility];  Service: Gastroenterology;;   POSTERIOR CERVICAL FUSION/FORAMINOTOMY N/A 06/18/2021   Procedure: POSTERIOR CERVICAL FUSION CERVICAL FIVE THROUGH SIX WITH TRIPLE WIRE TECHNIQUE FIXATION AND RIGHT ILIAC CREST BONE GRAFT HARVEST, LEFT CERVIACL SIX THROUGH SEVEN CERVICAL FORAMINAL LAMINOPLASTY;  Surgeon: Lucilla Lynwood BRAVO, MD;  Location: [facility];  Service: Orthopedics;  Laterality: N/A;   Past Surgical History:  Procedure Laterality Date   BIOPSY  04/08/2022   Procedure: BIOPSY;  Surgeon: Federico Rosario BROCKS, MD;  Location: [facility];  Service: Gastroenterology;;   COLONOSCOPY WITH PROPOFOL  N/A 04/08/2022   Procedure: COLONOSCOPY WITH PROPOFOL ;  Surgeon: Federico Rosario BROCKS, MD;  Location: [facility];  Service: Gastroenterology;  Laterality: N/A;   ESOPHAGOGASTRODUODENOSCOPY (EGD) WITH PROPOFOL  N/A 04/08/2022   Procedure: ESOPHAGOGASTRODUODENOSCOPY (EGD) WITH PROPOFOL ;  Surgeon: Federico Rosario BROCKS, MD;  Location: [facility];  Service: Gastroenterology;  Laterality: N/A;   POLYPECTOMY  04/08/2022   Procedure: POLYPECTOMY;  Surgeon: Federico Rosario BROCKS, MD;  Location: [facility];  Service: Gastroenterology;;   POSTERIOR CERVICAL FUSION/FORAMINOTOMY N/A 06/18/2021    Procedure: POSTERIOR CERVICAL FUSION CERVICAL FIVE THROUGH SIX WITH TRIPLE WIRE TECHNIQUE FIXATION AND RIGHT ILIAC CREST BONE GRAFT HARVEST, LEFT CERVIACL SIX THROUGH SEVEN CERVICAL FORAMINAL LAMINOPLASTY;  Surgeon: Lucilla Lynwood BRAVO, MD;  Location: [facility];  Service: Orthopedics;  Laterality: N/A;   Past Medical History:  Diagnosis Date   Acute incomplete quadriplegia (HCC) 11/02/2021   Anxiety disorder    Aortic atherosclerosis    Asthma    as a child   Autonomic dysreflexia    Cervical myelopathy (HCC) 06/22/2021   Chest  pain of uncertain etiology 10/04/2021   Chronic pain syndrome 11/02/2021   Cigarette smoker 10/04/2021   Depression    Diverticulosis    Essential (primary) hypertension    GERD (gastroesophageal reflux disease)    Headache    Neurogenic bladder 06/17/2022   Neurogenic bowel 02/04/2022   Other spondylosis with radiculopathy, cervical region 06/18/2021   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    Palpitations 10/04/2021   Pneumonia    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/2021   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   Spasticity 11/02/2021   Status post cervical spinal fusion 06/18/2021   Ht 5' 9 (1.753 m)   Wt 205 lb (93 kg)   BMI 30.27 kg/m   Opioid Risk Score:   Fall Risk Score:  `1  Depression screen [facility] 2/9     07/23/2024    9:17 AM 05/25/2024   12:56 PM 03/10/2024    8:44 AM 08/05/2023    9:11 AM 04/23/2023    9:24 AM 10/18/2022    9:44 AM 06/17/2022    9:14 AM  Depression screen PHQ 2/9  Decreased Interest 1 1 1  0 1 1 3   Down, Depressed, Hopeless 1 1 1  0 1 1 3   PHQ - 2 Score 2 2 2  0 2 2 6     Review of Systems  Gastrointestinal:  Positive for abdominal pain.  Musculoskeletal:  Positive for back pain and neck pain.       Pain in arms & legs, all over the body  Neurological:  Positive for headaches.  All other systems reviewed and are negative.      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  He now has chronic nerve and chronic pain since  surgery as well. 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  Is a C2 R hemi/SCI/quadriplegia- ASIA D. Now depressed with sleep issues as a result and neurogenic bowel/incontinence. Also has fibromyalgia- trauma induced.     Just dx'd with severe COPD and probable pulmonary fibrosis Here for f/u on SCI  Cannot do more trigger point injections- since caused more AD.  2.  Cannot tolerate touch very well- but really needs    3.   Can increase Duloxetine -  to 120 mg daily- for nerve pain and depression.   4.  Needs, it sounds to find another Pulmonary doctor- - because not feeling listened to- don't have another idea, unfortunately.    5.  Will need to do LFTs at next appt with BMP- so a CMP.    6.  Con't Valium /Diazepam - takes 1/2 tab 4x/day usually-  Doesn't need refills.   7. Con't Linzess - 290 mcg daily.  For severe opioid induced constipation.    8. Trigger points- hold theracane-  2-4 minutes on the spots that are tight- add more pressure slowly-  the more pressure you can tolerate, the more the muscle will start to relax.  Youtube has some great videos-   9. Talk to Angeline Iba about his BP- check it more regularly, and if it continues to stay high, ask about BP medicine .   10. Needs to  get Nitropaste. Last Rx 07/23/24.    11.  F/U in 3 months- double appt- SCI  12. Did oral drug screen- is due per clinic policy  I spent a total of   44  minutes on total care today- >50% coordination of care- due to  pt about new Sx's- which I think is a problem due to FMS and myofascial pain causing more Sx's- not specific to his SCI.  Also d/w pt about  meds, myofascial work and how to treat.

## 2024-10-28 LAB — DRUG TOX MONITOR 1 W/CONF, ORAL FLD
AMINOCLONAZEPAM: NEGATIVE ng/mL (ref <0.50)
Alprazolam: NEGATIVE ng/mL (ref <0.50)
Amphetamines: NEGATIVE ng/mL (ref <10)
Barbiturates: NEGATIVE ng/mL (ref <10)
Benzodiazepines: POSITIVE ng/mL — AB (ref <0.50)
Buprenorphine: NEGATIVE ng/mL (ref <0.10)
Chlordiazepoxide: NEGATIVE ng/mL (ref <0.50)
Clonazepam: NEGATIVE ng/mL (ref <0.50)
Cocaine: NEGATIVE ng/mL (ref <5.0)
Codeine: NEGATIVE ng/mL (ref <2.5)
Cotinine: >250 ng/mL — ABNORMAL HIGH (ref <5.0)
Diazepam: 2.73 ng/mL — ABNORMAL HIGH (ref <0.50)
Dihydrocodeine: NEGATIVE ng/mL (ref <2.5)
Fentanyl: NEGATIVE ng/mL (ref <0.10)
Flunitrazepam: NEGATIVE ng/mL (ref <0.50)
Flurazepam: NEGATIVE ng/mL (ref <0.50)
Heroin Metabolite: NEGATIVE ng/mL (ref <1.0)
Hydrocodone: NEGATIVE ng/mL (ref <2.5)
Hydromorphone: NEGATIVE ng/mL (ref <2.5)
Lorazepam: NEGATIVE ng/mL (ref <0.50)
MARIJUANA: NEGATIVE ng/mL (ref <2.5)
MDMA: NEGATIVE ng/mL (ref <10)
Meprobamate: NEGATIVE ng/mL (ref <2.5)
Methadone: NEGATIVE ng/mL (ref <5.0)
Midazolam: NEGATIVE ng/mL (ref <0.50)
Morphine: 69.2 ng/mL — ABNORMAL HIGH (ref <2.5)
Nicotine Metabolite: POSITIVE ng/mL — AB (ref <5.0)
Nordiazepam: 3.79 ng/mL — ABNORMAL HIGH (ref <0.50)
Norhydrocodone: NEGATIVE ng/mL (ref <2.5)
Noroxycodone: 38.2 ng/mL — ABNORMAL HIGH (ref <2.5)
Opiates: POSITIVE ng/mL — AB (ref <2.5)
Oxazepam: NEGATIVE ng/mL (ref <0.50)
Oxycodone: >250 ng/mL — ABNORMAL HIGH (ref <2.5)
Oxymorphone: NEGATIVE ng/mL (ref <2.5)
Phencyclidine: NEGATIVE ng/mL (ref <10)
Tapentadol: NEGATIVE ng/mL (ref <5.0)
Temazepam: NEGATIVE ng/mL (ref <0.50)
Tramadol: NEGATIVE ng/mL (ref <5.0)
Triazolam: NEGATIVE ng/mL (ref <0.50)
Zolpidem: NEGATIVE ng/mL (ref <5.0)

## 2024-10-28 LAB — DRUG TOX ALC METAB W/CON, ORAL FLD: Alcohol Metabolite: NEGATIVE ng/mL (ref <25)

## 2024-11-01 ENCOUNTER — Encounter: Payer: Self-pay | Admitting: Physical Medicine and Rehabilitation

## 2024-11-29 ENCOUNTER — Ambulatory Visit: Admitting: Allergy and Immunology

## 2024-12-21 ENCOUNTER — Other Ambulatory Visit: Payer: Self-pay | Admitting: Internal Medicine

## 2024-12-21 ENCOUNTER — Telehealth: Payer: Self-pay | Admitting: Registered Nurse

## 2024-12-21 DIAGNOSIS — G904 Autonomic dysreflexia: Secondary | ICD-10-CM

## 2024-12-21 DIAGNOSIS — G894 Chronic pain syndrome: Secondary | ICD-10-CM

## 2024-12-21 DIAGNOSIS — G8250 Quadriplegia, unspecified: Secondary | ICD-10-CM

## 2024-12-21 MED ORDER — MORPHINE SULFATE ER 15 MG PO TBCR: EXTENDED_RELEASE_TABLET | ORAL | 0 refills | Status: DC

## 2024-12-21 MED ORDER — OXYCODONE HCL 10 MG PO TABS: 5.0000 mg | ORAL_TABLET | ORAL | 0 refills | Status: DC | PRN

## 2024-12-21 NOTE — Telephone Encounter (Signed)
 DR Cornelio,  Spoke with [facility], they are in [place]  helping to take care of Victor Huang father. She asked about a Video visit, I am unable to do Video visit , I only have [place] license. I'm not sure if you are able to do video visit for him, I sent in January prescriptions. His last visit was in October. I will await your response.  Fidela

## 2024-12-27 ENCOUNTER — Encounter: Attending: Registered Nurse | Admitting: Registered Nurse

## 2024-12-27 ENCOUNTER — Encounter: Payer: Self-pay | Admitting: Registered Nurse

## 2024-12-27 VITALS — BP 123/88 | HR 76 | Ht 69.0 in | Wt 209.6 lb

## 2024-12-27 DIAGNOSIS — Z79891 Long term (current) use of opiate analgesic: Secondary | ICD-10-CM | POA: Diagnosis present

## 2024-12-27 DIAGNOSIS — F411 Generalized anxiety disorder: Secondary | ICD-10-CM | POA: Insufficient documentation

## 2024-12-27 DIAGNOSIS — G8250 Quadriplegia, unspecified: Secondary | ICD-10-CM | POA: Insufficient documentation

## 2024-12-27 DIAGNOSIS — M5412 Radiculopathy, cervical region: Secondary | ICD-10-CM | POA: Insufficient documentation

## 2024-12-27 DIAGNOSIS — M542 Cervicalgia: Secondary | ICD-10-CM | POA: Diagnosis present

## 2024-12-27 DIAGNOSIS — G904 Autonomic dysreflexia: Secondary | ICD-10-CM | POA: Diagnosis present

## 2024-12-27 DIAGNOSIS — G894 Chronic pain syndrome: Secondary | ICD-10-CM | POA: Insufficient documentation

## 2024-12-27 DIAGNOSIS — Z5181 Encounter for therapeutic drug level monitoring: Secondary | ICD-10-CM | POA: Diagnosis present

## 2024-12-27 MED ORDER — DIAZEPAM 5 MG PO TABS: 2.5000 mg | ORAL_TABLET | Freq: Two times a day (BID) | ORAL | 5 refills | Status: AC

## 2024-12-27 MED ORDER — OXYCODONE HCL 10 MG PO TABS: 5.0000 mg | ORAL_TABLET | ORAL | 0 refills | Status: AC | PRN

## 2024-12-27 MED ORDER — MORPHINE SULFATE ER 15 MG PO TBCR: EXTENDED_RELEASE_TABLET | ORAL | 0 refills | Status: AC

## 2024-12-27 NOTE — Progress Notes (Signed)
 "  Subjective:    Patient ID: Victor Huang, male    DOB: 24-Apr-1976, 48 y.o.   MRN: [id number]  HPI: Victor Huang is a 48 y.o. male who returns for follow up appointment for chronic pain and medication refill. He states his pain is located in his neck radiating into his bilateral shoulders, and lower back pain. He rates his pain 3. His current exercise regime is walking and performing stretching exercises.  Victor Huang Morphine  equivalent is 120.00 MME. He  is also prescribed Diazepam  by Dr. Lovorn .We have discussed the black box warning of using opioids and benzodiazepines. I highlighted the dangers of using these drugs together and discussed the adverse events including respiratory suppression, overdose, cognitive impairment and importance of compliance with current regimen. We will continue to monitor and adjust as indicated.    Last Oral Swab was Performed on 10/25/2024, it was consistent.    Pain Inventory Average Pain 4 Pain Right Now 3 My pain is sharp, burning, stabbing, and aching  In the last 24 hours, has pain interfered with the following? General activity 3 Relation with others 1 Enjoyment of life 1 What TIME of day is your pain at its worst? morning , daytime, evening, and night Sleep (in general) Fair  Pain is worse with: walking, bending, sitting, standing, and some activites Pain improves with: rest and medication Relief from Meds: 7  Family History  Problem Relation Age of Onset   Hypertension Mother    Diabetes Mother    Cervical cancer Mother    Heart attack Father    Asthma Maternal Grandmother    Food Allergy Son    Seizures Son    Allergies Son    Colitis Son    GER disease Son    Irritable bowel syndrome Son    Asthma Son    Food Allergy Son    Allergies Son    Other Son        eosinophilic esophagitis   Colitis Son    GER disease Son    Asthma Son    Colon cancer Neg Hx    Esophageal cancer Neg Hx    Stomach cancer Neg Hx    Pancreatic  cancer Neg Hx    Colon polyps Neg Hx    Social History   Socioeconomic History   Marital status: Married    Spouse name: Not on file   Number of children: 2   Years of education: Not on file   Highest education level: Not on file  Occupational History   Not on file  Tobacco Use   Smoking status: Every Day    Current packs/day: 1.00    Average packs/day: 1 pack/day for 38.0 years (38.0 ttl pk-yrs)    Types: Cigarettes    Start date: 01/01/1987   Smokeless tobacco: Never  Vaping Use   Vaping status: Never Used  Substance and Sexual Activity   Alcohol use: Never    Comment: no beer in over 15 years   Drug use: Not Currently   Sexual activity: Yes  Other Topics Concern   Not on file  Social History Narrative   Not on file   Social Drivers of Health   Tobacco Use: High Risk (12/27/2024)   Patient History    Smoking Tobacco Use: Every Day    Smokeless Tobacco Use: Never    Passive Exposure: Not on file  Financial Resource Strain: Not on file  Food Insecurity: Not on file  Transportation Needs: Not on file  Physical Activity: Not on file  Stress: Not on file  Social Connections: Unknown (01/02/2023)   Received from [facility]   Social Network    Social Network: Not on file  Depression (PHQ2-9): Low Risk (10/25/2024)   Depression (PHQ2-9)    PHQ-2 Score: 2  Alcohol Screen: Not on file  Housing: Not on file  Utilities: Not on file  Health Literacy: Not on file   Past Surgical History:  Procedure Laterality Date   BIOPSY  04/08/2022   Procedure: BIOPSY;  Surgeon: Federico Rosario BROCKS, MD;  Location: [facility];  Service: Gastroenterology;;   COLONOSCOPY WITH PROPOFOL  N/A 04/08/2022   Procedure: COLONOSCOPY WITH PROPOFOL ;  Surgeon: Federico Rosario BROCKS, MD;  Location: [facility];  Service: Gastroenterology;  Laterality: N/A;   ESOPHAGOGASTRODUODENOSCOPY (EGD) WITH PROPOFOL  N/A 04/08/2022   Procedure: ESOPHAGOGASTRODUODENOSCOPY (EGD) WITH PROPOFOL ;  Surgeon: Federico Rosario BROCKS, MD;   Location: [facility];  Service: Gastroenterology;  Laterality: N/A;   POLYPECTOMY  04/08/2022   Procedure: POLYPECTOMY;  Surgeon: Federico Rosario BROCKS, MD;  Location: [facility];  Service: Gastroenterology;;   POSTERIOR CERVICAL FUSION/FORAMINOTOMY N/A 06/18/2021   Procedure: POSTERIOR CERVICAL FUSION CERVICAL FIVE THROUGH SIX WITH TRIPLE WIRE TECHNIQUE FIXATION AND RIGHT ILIAC CREST BONE GRAFT HARVEST, LEFT CERVIACL SIX THROUGH SEVEN CERVICAL FORAMINAL LAMINOPLASTY;  Surgeon: Lucilla Lynwood BRAVO, MD;  Location: [facility];  Service: Orthopedics;  Laterality: N/A;   Past Surgical History:  Procedure Laterality Date   BIOPSY  04/08/2022   Procedure: BIOPSY;  Surgeon: Federico Rosario BROCKS, MD;  Location: [facility];  Service: Gastroenterology;;   COLONOSCOPY WITH PROPOFOL  N/A 04/08/2022   Procedure: COLONOSCOPY WITH PROPOFOL ;  Surgeon: Federico Rosario BROCKS, MD;  Location: [facility];  Service: Gastroenterology;  Laterality: N/A;   ESOPHAGOGASTRODUODENOSCOPY (EGD) WITH PROPOFOL  N/A 04/08/2022   Procedure: ESOPHAGOGASTRODUODENOSCOPY (EGD) WITH PROPOFOL ;  Surgeon: Federico Rosario BROCKS, MD;  Location: [facility];  Service: Gastroenterology;  Laterality: N/A;   POLYPECTOMY  04/08/2022   Procedure: POLYPECTOMY;  Surgeon: Federico Rosario BROCKS, MD;  Location: [facility];  Service: Gastroenterology;;   POSTERIOR CERVICAL FUSION/FORAMINOTOMY N/A 06/18/2021   Procedure: POSTERIOR CERVICAL FUSION CERVICAL FIVE THROUGH SIX WITH TRIPLE WIRE TECHNIQUE FIXATION AND RIGHT ILIAC CREST BONE GRAFT HARVEST, LEFT CERVIACL SIX THROUGH SEVEN CERVICAL FORAMINAL LAMINOPLASTY;  Surgeon: Lucilla Lynwood BRAVO, MD;  Location: [facility];  Service: Orthopedics;  Laterality: N/A;   Past Medical History:  Diagnosis Date   Acute incomplete quadriplegia (HCC) 11/02/2021   Anxiety disorder    Aortic atherosclerosis    Asthma    as a child   Autonomic dysreflexia    Cervical myelopathy (HCC) 06/22/2021   Chest pain of uncertain etiology 10/04/2021   Chronic pain  syndrome 11/02/2021   Cigarette smoker 10/04/2021   Depression    Diverticulosis    Essential (primary) hypertension    GERD (gastroesophageal reflux disease)    Headache    Neurogenic bladder 06/17/2022   Neurogenic bowel 02/04/2022   Other spondylosis with radiculopathy, cervical region 06/18/2021   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    Palpitations 10/04/2021   Pneumonia    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/2021   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   Spasticity 11/02/2021   Status post cervical spinal fusion 06/18/2021   BP 123/88   Pulse 76   Ht 5' 9 (1.753 m)   Wt 209 lb 9.6 oz (95.1 kg)   SpO2 95%   BMI 30.95 kg/m   Opioid Risk Score:   Fall  Risk Score:  `1  Depression screen [facility] 2/9     12/27/2024    9:21 AM 10/25/2024    9:21 AM 07/23/2024    9:17 AM 05/25/2024   12:56 PM 03/10/2024    8:44 AM 08/05/2023    9:11 AM 04/23/2023    9:24 AM  Depression screen PHQ 2/9  Decreased Interest 2 1 1 1 1  0 1  Down, Depressed, Hopeless 2 1 1 1 1  0 1  PHQ - 2 Score 4 2 2 2 2  0 2    Review of Systems  Musculoskeletal:  Positive for back pain.  All other systems reviewed and are negative.      Objective:   Physical Exam Vitals and nursing note reviewed.  Constitutional:      Appearance: Normal appearance.  Neck:     Comments: Cervical Paraspinal Tenderness: C-5-C-6 Cardiovascular:     Rate and Rhythm: Normal rate and regular rhythm.     Pulses: Normal pulses.     Heart sounds: Normal heart sounds.  Pulmonary:     Effort: Pulmonary effort is normal.     Breath sounds: Normal breath sounds.  Musculoskeletal:     Comments: Normal Muscle Bulk and Muscle Testing Reveals:  Upper Extremities: Full ROM and Muscle Strength 5/5 Bilateral AC Joint Tenderness Lumbar Paraspinal Tenderness: L-4-L-5 Lower Extremities: Full ROM and Muscle Strength 5/5 Arises from chair slowly Narrow Based  Gait     Skin:    General: Skin is warm and dry.  Neurological:     Mental  Status: He is alert and oriented to person, place, and time.  Psychiatric:        Mood and Affect: Mood normal.        Behavior: Behavior normal.          Assessment & Plan:  Acute Incomplete Quadriplegia: Continue with HEP as tolerated. Continue to Monitor. 12/27/2024 Autonomic Dysreflexia: Cannot Stand for more than 10- 20 minutes Continue current medication regimen. Continue to monitor. 12/27/2024 Cervicalgia/ Cervical Radiculitis: Continue Pregabalin . Continue to Monitor. 12/27/2024 Chronic Pain Syndrome: Refilled: MS Contin  ER 15 mg take 2  tablets in the morning and one tablet in the evening #90 and Oxycodone  10 mg one tablet every 4 hours as needed for pain #150.  We will continue the opioid monitoring program, this consists of regular clinic visits, examinations, urine drug screen, pill counts as well as use of [place]  Controlled Substance Reporting system. A 12 month History has been reviewed on the [place]  Controlled Substance Reporting System on 12/27/2024   F/U with Dr Cornelio  "

## 2024-12-29 ENCOUNTER — Encounter: Admitting: Registered Nurse

## 2025-01-04 NOTE — Telephone Encounter (Signed)
"   Pt having a rough week  Sending for CXR- WBC is still high-  Episode week, because some infection or inflammation setting it off   They did check his urine. Was OK.   Explained [place] from another [place], with video visit, also need to check to see if insurance will cover a video visit or not.    "

## 2025-01-31 ENCOUNTER — Encounter: Admitting: Physical Medicine and Rehabilitation

## 2025-01-31 ENCOUNTER — Encounter: Attending: Registered Nurse | Admitting: Physical Medicine and Rehabilitation

## 2025-02-21 ENCOUNTER — Encounter: Admitting: Registered Nurse

## 2025-02-25 ENCOUNTER — Encounter: Admitting: Physical Medicine and Rehabilitation

## 2025-05-06 ENCOUNTER — Encounter: Admitting: Physical Medicine and Rehabilitation
